# Patient Record
Sex: Female | Born: 1935
Health system: Southern US, Community
[De-identification: ages and names within clinical notes are randomized; demographics above are authoritative.]

## PROBLEM LIST (undated history)

## (undated) DIAGNOSIS — K5790 Diverticulosis of intestine, part unspecified, without perforation or abscess without bleeding: Secondary | ICD-10-CM

## (undated) DIAGNOSIS — R739 Hyperglycemia, unspecified: Secondary | ICD-10-CM

## (undated) DIAGNOSIS — I10 Essential (primary) hypertension: Secondary | ICD-10-CM

## (undated) DIAGNOSIS — K219 Gastro-esophageal reflux disease without esophagitis: Secondary | ICD-10-CM

## (undated) DIAGNOSIS — M8580 Other specified disorders of bone density and structure, unspecified site: Secondary | ICD-10-CM

## (undated) DIAGNOSIS — H9190 Unspecified hearing loss, unspecified ear: Secondary | ICD-10-CM

## (undated) DIAGNOSIS — E785 Hyperlipidemia, unspecified: Secondary | ICD-10-CM

## (undated) DIAGNOSIS — M1990 Unspecified osteoarthritis, unspecified site: Secondary | ICD-10-CM

## (undated) DIAGNOSIS — F0280 Dementia in other diseases classified elsewhere without behavioral disturbance: Secondary | ICD-10-CM

## (undated) DIAGNOSIS — R413 Other amnesia: Secondary | ICD-10-CM

## (undated) DIAGNOSIS — G309 Alzheimer's disease, unspecified: Secondary | ICD-10-CM

## (undated) DIAGNOSIS — K635 Polyp of colon: Secondary | ICD-10-CM

## (undated) DIAGNOSIS — Z860100 Personal history of colon polyps, unspecified: Secondary | ICD-10-CM

## (undated) DIAGNOSIS — S2020XA Contusion of thorax, unspecified, initial encounter: Secondary | ICD-10-CM

## (undated) HISTORY — DX: Gastro-esophageal reflux disease without esophagitis: K21.9

## (undated) HISTORY — DX: Polyp of colon: K63.5

## (undated) HISTORY — DX: Personal history of colon polyps, unspecified: Z86.0100

## (undated) HISTORY — DX: Personal history of colonic polyps: Z86.010

## (undated) HISTORY — DX: Other amnesia: R41.3

## (undated) HISTORY — DX: Essential (primary) hypertension: I10

## (undated) HISTORY — DX: Diverticulosis of intestine, part unspecified, without perforation or abscess without bleeding: K57.90

## (undated) HISTORY — DX: Contusion of thorax, unspecified, initial encounter: S20.20XA

## (undated) HISTORY — DX: Other specified disorders of bone density and structure, unspecified site: M85.80

## (undated) HISTORY — DX: Hyperlipidemia, unspecified: E78.5

## (undated) HISTORY — DX: Hyperglycemia, unspecified: R73.9

## (undated) HISTORY — PX: ABDOMINAL HYSTERECTOMY: SHX81

## (undated) HISTORY — DX: Alzheimer's disease, unspecified: G30.9

## (undated) HISTORY — PX: PARTIAL HYSTERECTOMY: SHX80

## (undated) HISTORY — PX: TUBAL LIGATION: SHX77

## (undated) HISTORY — PX: TONSILLECTOMY: SUR1361

## (undated) HISTORY — DX: Unspecified osteoarthritis, unspecified site: M19.90

## (undated) HISTORY — DX: Dementia in other diseases classified elsewhere without behavioral disturbance: F02.80

---

## 1997-09-22 HISTORY — PX: OTHER SURGICAL HISTORY: SHX169

## 2004-04-09 ENCOUNTER — Other Ambulatory Visit: Payer: Self-pay

## 2004-04-16 HISTORY — PX: CHOLECYSTECTOMY: SHX55

## 2005-09-30 ENCOUNTER — Ambulatory Visit: Payer: Self-pay | Admitting: Family Medicine

## 2006-11-03 ENCOUNTER — Ambulatory Visit: Payer: Self-pay | Admitting: Family Medicine

## 2007-01-19 ENCOUNTER — Ambulatory Visit: Payer: Self-pay | Admitting: Ophthalmology

## 2007-01-25 ENCOUNTER — Ambulatory Visit: Payer: Self-pay | Admitting: Ophthalmology

## 2008-01-12 ENCOUNTER — Ambulatory Visit: Payer: Self-pay | Admitting: Family Medicine

## 2008-11-29 ENCOUNTER — Ambulatory Visit: Payer: Self-pay | Admitting: Family Medicine

## 2008-12-20 DIAGNOSIS — R29898 Other symptoms and signs involving the musculoskeletal system: Secondary | ICD-10-CM | POA: Insufficient documentation

## 2008-12-26 ENCOUNTER — Ambulatory Visit: Payer: Self-pay | Admitting: Family Medicine

## 2009-03-06 ENCOUNTER — Ambulatory Visit: Payer: Self-pay | Admitting: Neurology

## 2009-09-03 ENCOUNTER — Ambulatory Visit: Payer: Self-pay | Admitting: Family Medicine

## 2010-09-12 ENCOUNTER — Ambulatory Visit: Payer: Self-pay | Admitting: Family Medicine

## 2011-10-24 ENCOUNTER — Ambulatory Visit: Payer: Self-pay | Admitting: Family Medicine

## 2011-11-03 ENCOUNTER — Ambulatory Visit: Payer: Self-pay | Admitting: Family Medicine

## 2012-04-08 ENCOUNTER — Emergency Department: Payer: Self-pay | Admitting: Emergency Medicine

## 2012-10-04 ENCOUNTER — Ambulatory Visit: Payer: Self-pay | Admitting: Family Medicine

## 2012-10-04 LAB — CBC WITH DIFFERENTIAL/PLATELET
Basophil #: 0.1 10*3/uL (ref 0.0–0.1)
Eosinophil %: 0.1 %
HCT: 48.3 % — ABNORMAL HIGH (ref 35.0–47.0)
Lymphocyte %: 15.2 %
MCH: 30.5 pg (ref 26.0–34.0)
MCHC: 33.4 g/dL (ref 32.0–36.0)
Monocyte #: 0.9 x10 3/mm (ref 0.2–0.9)
Monocyte %: 6.6 %
Neutrophil #: 11 10*3/uL — ABNORMAL HIGH (ref 1.4–6.5)
Neutrophil %: 77.3 %
RBC: 5.29 10*6/uL — ABNORMAL HIGH (ref 3.80–5.20)
RDW: 12.9 % (ref 11.5–14.5)
WBC: 14.2 10*3/uL — ABNORMAL HIGH (ref 3.6–11.0)

## 2012-10-04 LAB — COMPREHENSIVE METABOLIC PANEL
Alkaline Phosphatase: 83 U/L (ref 50–136)
Anion Gap: 7 (ref 7–16)
Chloride: 102 mmol/L (ref 98–107)
Co2: 29 mmol/L (ref 21–32)
Creatinine: 1.33 mg/dL — ABNORMAL HIGH (ref 0.60–1.30)
EGFR (African American): 46 — ABNORMAL LOW
Glucose: 106 mg/dL — ABNORMAL HIGH (ref 65–99)
Potassium: 3.6 mmol/L (ref 3.5–5.1)
SGPT (ALT): 22 U/L (ref 12–78)
Total Protein: 8.9 g/dL — ABNORMAL HIGH (ref 6.4–8.2)

## 2012-12-10 ENCOUNTER — Ambulatory Visit: Payer: Self-pay | Admitting: Family Medicine

## 2013-01-12 ENCOUNTER — Ambulatory Visit: Payer: Self-pay | Admitting: Family Medicine

## 2013-01-20 ENCOUNTER — Ambulatory Visit: Payer: Self-pay | Admitting: Family Medicine

## 2013-05-30 ENCOUNTER — Ambulatory Visit: Payer: Self-pay | Admitting: Family Medicine

## 2013-08-18 ENCOUNTER — Emergency Department: Payer: Self-pay | Admitting: Emergency Medicine

## 2013-08-18 LAB — COMPREHENSIVE METABOLIC PANEL
Albumin: 3.8 g/dL (ref 3.4–5.0)
Alkaline Phosphatase: 71 U/L
Anion Gap: 5 — ABNORMAL LOW (ref 7–16)
BUN: 26 mg/dL — ABNORMAL HIGH (ref 7–18)
Bilirubin,Total: 0.5 mg/dL (ref 0.2–1.0)
Calcium, Total: 9.9 mg/dL (ref 8.5–10.1)
Chloride: 102 mmol/L (ref 98–107)
Co2: 30 mmol/L (ref 21–32)
EGFR (African American): >60
EGFR (Non-African Amer.): 57 — ABNORMAL LOW
Osmolality: 279 (ref 275–301)
SGOT(AST): 31 U/L (ref 15–37)
Total Protein: 7.9 g/dL (ref 6.4–8.2)

## 2013-08-18 LAB — CBC
HCT: 49.3 % — ABNORMAL HIGH (ref 35.0–47.0)
HGB: 16.5 g/dL — ABNORMAL HIGH (ref 12.0–16.0)
MCH: 29.7 pg (ref 26.0–34.0)
MCV: 89 fL (ref 80–100)
Platelet: 209 10*3/uL (ref 150–440)
RBC: 5.57 10*6/uL — ABNORMAL HIGH (ref 3.80–5.20)
RDW: 13.6 % (ref 11.5–14.5)
WBC: 6 10*3/uL (ref 3.6–11.0)

## 2014-03-27 ENCOUNTER — Ambulatory Visit: Payer: Self-pay | Admitting: Family Medicine

## 2014-03-27 LAB — HM MAMMOGRAPHY: HM MAMMO: NEGATIVE

## 2014-11-21 DIAGNOSIS — R413 Other amnesia: Secondary | ICD-10-CM | POA: Diagnosis not present

## 2014-11-21 DIAGNOSIS — E780 Pure hypercholesterolemia: Secondary | ICD-10-CM | POA: Diagnosis not present

## 2014-11-21 DIAGNOSIS — I10 Essential (primary) hypertension: Secondary | ICD-10-CM | POA: Diagnosis not present

## 2014-11-21 DIAGNOSIS — R739 Hyperglycemia, unspecified: Secondary | ICD-10-CM | POA: Diagnosis not present

## 2014-11-21 DIAGNOSIS — Z0000 Encounter for general adult medical examination without abnormal findings: Secondary | ICD-10-CM | POA: Diagnosis not present

## 2014-11-21 LAB — BASIC METABOLIC PANEL
BUN: 16 mg/dL (ref 4–21)
CREATININE: 1.2 mg/dL — AB (ref 0.5–1.1)
Glucose: 96 mg/dL
POTASSIUM: 4.1 mmol/L (ref 3.4–5.3)
Sodium: 140 mmol/L (ref 137–147)

## 2014-11-21 LAB — LIPID PANEL
CHOLESTEROL: 223 mg/dL — AB (ref 0–200)
HDL: 57 mg/dL (ref 35–70)
LDL Cholesterol: 120 mg/dL
Triglycerides: 231 mg/dL — AB (ref 40–160)

## 2014-11-21 LAB — CBC AND DIFFERENTIAL
HCT: 54 % — AB (ref 36–46)
Hemoglobin: 18 g/dL — AB (ref 12.0–16.0)
Platelets: 255 10*3/uL (ref 150–399)

## 2014-11-21 LAB — HEPATIC FUNCTION PANEL
ALT: 23 U/L (ref 7–35)
AST: 23 U/L (ref 13–35)

## 2014-11-21 LAB — TSH: TSH: 1.84 u[IU]/mL (ref 0.41–5.90)

## 2014-11-21 LAB — HEMOGLOBIN A1C: Hemoglobin A1C: 5.9

## 2014-12-07 ENCOUNTER — Ambulatory Visit: Payer: Self-pay | Admitting: Family Medicine

## 2014-12-07 DIAGNOSIS — Z780 Asymptomatic menopausal state: Secondary | ICD-10-CM | POA: Diagnosis not present

## 2014-12-07 DIAGNOSIS — Z13820 Encounter for screening for osteoporosis: Secondary | ICD-10-CM | POA: Diagnosis not present

## 2014-12-12 DIAGNOSIS — M542 Cervicalgia: Secondary | ICD-10-CM | POA: Insufficient documentation

## 2014-12-12 DIAGNOSIS — R319 Hematuria, unspecified: Secondary | ICD-10-CM | POA: Insufficient documentation

## 2014-12-12 DIAGNOSIS — R569 Unspecified convulsions: Secondary | ICD-10-CM | POA: Insufficient documentation

## 2014-12-12 DIAGNOSIS — F0150 Vascular dementia without behavioral disturbance: Secondary | ICD-10-CM | POA: Diagnosis not present

## 2014-12-12 HISTORY — DX: Cervicalgia: M54.2

## 2015-02-05 DIAGNOSIS — N3090 Cystitis, unspecified without hematuria: Secondary | ICD-10-CM | POA: Diagnosis not present

## 2015-02-05 DIAGNOSIS — R197 Diarrhea, unspecified: Secondary | ICD-10-CM | POA: Diagnosis not present

## 2015-02-05 LAB — CBC AND DIFFERENTIAL: WBC: 0.5 10*3/mL

## 2015-11-20 ENCOUNTER — Ambulatory Visit: Payer: Self-pay | Admitting: Physician Assistant

## 2015-11-21 ENCOUNTER — Telehealth: Payer: Self-pay | Admitting: Family Medicine

## 2015-11-21 ENCOUNTER — Ambulatory Visit: Payer: Self-pay | Admitting: Physician Assistant

## 2015-11-21 DIAGNOSIS — M8580 Other specified disorders of bone density and structure, unspecified site: Secondary | ICD-10-CM | POA: Insufficient documentation

## 2015-11-21 DIAGNOSIS — K635 Polyp of colon: Secondary | ICD-10-CM | POA: Insufficient documentation

## 2015-11-21 DIAGNOSIS — I2510 Atherosclerotic heart disease of native coronary artery without angina pectoris: Secondary | ICD-10-CM

## 2015-11-21 DIAGNOSIS — N814 Uterovaginal prolapse, unspecified: Secondary | ICD-10-CM | POA: Insufficient documentation

## 2015-11-21 DIAGNOSIS — R899 Unspecified abnormal finding in specimens from other organs, systems and tissues: Secondary | ICD-10-CM | POA: Insufficient documentation

## 2015-11-21 DIAGNOSIS — M545 Low back pain, unspecified: Secondary | ICD-10-CM

## 2015-11-21 DIAGNOSIS — R739 Hyperglycemia, unspecified: Secondary | ICD-10-CM | POA: Insufficient documentation

## 2015-11-21 DIAGNOSIS — S2020XA Contusion of thorax, unspecified, initial encounter: Secondary | ICD-10-CM | POA: Insufficient documentation

## 2015-11-21 DIAGNOSIS — K921 Melena: Secondary | ICD-10-CM | POA: Insufficient documentation

## 2015-11-21 DIAGNOSIS — K5790 Diverticulosis of intestine, part unspecified, without perforation or abscess without bleeding: Secondary | ICD-10-CM

## 2015-11-21 DIAGNOSIS — G309 Alzheimer's disease, unspecified: Secondary | ICD-10-CM

## 2015-11-21 DIAGNOSIS — I10 Essential (primary) hypertension: Secondary | ICD-10-CM

## 2015-11-21 DIAGNOSIS — T7840XA Allergy, unspecified, initial encounter: Secondary | ICD-10-CM | POA: Insufficient documentation

## 2015-11-21 DIAGNOSIS — N812 Incomplete uterovaginal prolapse: Secondary | ICD-10-CM | POA: Insufficient documentation

## 2015-11-21 DIAGNOSIS — M109 Gout, unspecified: Secondary | ICD-10-CM | POA: Insufficient documentation

## 2015-11-21 DIAGNOSIS — E7800 Pure hypercholesterolemia, unspecified: Secondary | ICD-10-CM | POA: Insufficient documentation

## 2015-11-21 DIAGNOSIS — F0280 Dementia in other diseases classified elsewhere without behavioral disturbance: Secondary | ICD-10-CM

## 2015-11-21 DIAGNOSIS — W57XXXA Bitten or stung by nonvenomous insect and other nonvenomous arthropods, initial encounter: Secondary | ICD-10-CM | POA: Insufficient documentation

## 2015-11-21 DIAGNOSIS — H269 Unspecified cataract: Secondary | ICD-10-CM | POA: Insufficient documentation

## 2015-11-21 DIAGNOSIS — G4700 Insomnia, unspecified: Secondary | ICD-10-CM

## 2015-11-21 DIAGNOSIS — K219 Gastro-esophageal reflux disease without esophagitis: Secondary | ICD-10-CM | POA: Insufficient documentation

## 2015-11-21 DIAGNOSIS — R413 Other amnesia: Secondary | ICD-10-CM | POA: Insufficient documentation

## 2015-11-21 HISTORY — DX: Unspecified cataract: H26.9

## 2015-11-21 HISTORY — DX: Essential (primary) hypertension: I10

## 2015-11-21 HISTORY — DX: Pure hypercholesterolemia, unspecified: E78.00

## 2015-11-21 HISTORY — DX: Gout, unspecified: M10.9

## 2015-11-21 HISTORY — DX: Dementia in other diseases classified elsewhere, unspecified severity, without behavioral disturbance, psychotic disturbance, mood disturbance, and anxiety: F02.80

## 2015-11-21 HISTORY — DX: Low back pain, unspecified: M54.50

## 2015-11-21 HISTORY — DX: Diverticulosis of intestine, part unspecified, without perforation or abscess without bleeding: K57.90

## 2015-11-21 HISTORY — DX: Insomnia, unspecified: G47.00

## 2015-11-21 HISTORY — DX: Incomplete uterovaginal prolapse: N81.2

## 2015-11-21 HISTORY — DX: Uterovaginal prolapse, unspecified: N81.4

## 2015-11-21 HISTORY — DX: Atherosclerotic heart disease of native coronary artery without angina pectoris: I25.10

## 2015-11-21 NOTE — Telephone Encounter (Signed)
Pt daughter, [name] is requesting a call from Dr Elease Hashimoto.  UE#[phone]

## 2015-11-23 ENCOUNTER — Encounter: Payer: Self-pay | Admitting: Family Medicine

## 2015-11-23 ENCOUNTER — Ambulatory Visit (INDEPENDENT_AMBULATORY_CARE_PROVIDER_SITE_OTHER): Payer: Commercial Managed Care - HMO | Admitting: Family Medicine

## 2015-11-23 VITALS — BP 142/78 | HR 68 | Temp 97.7°F | Resp 20 | Wt 146.0 lb

## 2015-11-23 DIAGNOSIS — F0150 Vascular dementia without behavioral disturbance: Secondary | ICD-10-CM

## 2015-11-23 DIAGNOSIS — I10 Essential (primary) hypertension: Secondary | ICD-10-CM | POA: Diagnosis not present

## 2015-11-23 DIAGNOSIS — E7800 Pure hypercholesterolemia, unspecified: Secondary | ICD-10-CM | POA: Diagnosis not present

## 2015-11-23 MED ORDER — DONEPEZIL HCL 5 MG PO TABS: 5.0000 mg | ORAL_TABLET | Freq: Every day | ORAL | Status: DC

## 2015-11-23 MED ORDER — HYDROCHLOROTHIAZIDE 12.5 MG PO TABS: 12.5000 mg | ORAL_TABLET | Freq: Every day | ORAL | Status: DC
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
## 2015-11-24 LAB — LIPID PANEL
CHOLESTEROL TOTAL: 236 mg/dL — AB (ref 100–199)
Chol/HDL Ratio: 5.4 ratio units — ABNORMAL HIGH (ref 0.0–4.4)
HDL: 44 mg/dL (ref >39)
LDL CALC: 138 mg/dL — AB (ref 0–99)
TRIGLYCERIDES: 268 mg/dL — AB (ref 0–149)
VLDL Cholesterol Cal: 54 mg/dL — ABNORMAL HIGH (ref 5–40)

## 2015-11-24 LAB — CBC WITH DIFFERENTIAL/PLATELET
BASOS ABS: 0 10*3/uL (ref 0.0–0.2)
Basos: 1 %
EOS (ABSOLUTE): 0.1 10*3/uL (ref 0.0–0.4)
EOS: 1 %
HEMATOCRIT: 49.9 % — AB (ref 34.0–46.6)
Hemoglobin: 16.7 g/dL — ABNORMAL HIGH (ref 11.1–15.9)
IMMATURE GRANULOCYTES: 0 %
Immature Grans (Abs): 0 10*3/uL (ref 0.0–0.1)
LYMPHS ABS: 1.9 10*3/uL (ref 0.7–3.1)
LYMPHS: 34 %
MCH: 31.4 pg (ref 26.6–33.0)
MCHC: 33.5 g/dL (ref 31.5–35.7)
MCV: 94 fL (ref 79–97)
MONOCYTES: 8 %
Monocytes Absolute: 0.4 10*3/uL (ref 0.1–0.9)
Neutrophils Absolute: 3.1 10*3/uL (ref 1.4–7.0)
Neutrophils: 56 %
PLATELETS: 244 10*3/uL (ref 150–379)
RBC: 5.32 x10E6/uL — AB (ref 3.77–5.28)
RDW: 13.4 % (ref 12.3–15.4)
WBC: 5.6 10*3/uL (ref 3.4–10.8)

## 2015-11-24 LAB — COMPREHENSIVE METABOLIC PANEL
ALT: 19 IU/L (ref 0–32)
AST: 19 IU/L (ref 0–40)
Albumin/Globulin Ratio: 1.9 (ref 1.1–2.5)
Albumin: 4.3 g/dL (ref 3.5–4.8)
Alkaline Phosphatase: 67 IU/L (ref 39–117)
BUN/Creatinine Ratio: 14 (ref 11–26)
BUN: 14 mg/dL (ref 8–27)
Bilirubin Total: 0.5 mg/dL (ref 0.0–1.2)
CALCIUM: 8.9 mg/dL (ref 8.7–10.3)
CO2: 26 mmol/L (ref 18–29)
CREATININE: 0.97 mg/dL (ref 0.57–1.00)
Chloride: 100 mmol/L (ref 96–106)
GFR, EST AFRICAN AMERICAN: 65 mL/min/{1.73_m2} (ref >59)
GFR, EST NON AFRICAN AMERICAN: 57 mL/min/{1.73_m2} — AB (ref >59)
GLOBULIN, TOTAL: 2.3 g/dL (ref 1.5–4.5)
Glucose: 106 mg/dL — ABNORMAL HIGH (ref 65–99)
Potassium: 3.7 mmol/L (ref 3.5–5.2)
SODIUM: 142 mmol/L (ref 134–144)
TOTAL PROTEIN: 6.6 g/dL (ref 6.0–8.5)

## 2015-11-24 LAB — TSH: TSH: 2.98 u[IU]/mL (ref 0.450–4.500)

## 2015-11-26 NOTE — Telephone Encounter (Signed)
Patient advised as below.  

## 2015-11-26 NOTE — Telephone Encounter (Signed)
-----   Message from Lorie PhenixNancy Maloney, MD sent at 11/24/2015 11:00 AM EST ----- Labs stable except hgb slightly high. May be dehydrated. This has been up before. Recheck on 4 to 6 weeks. Thanks.

## 2015-12-13 ENCOUNTER — Encounter: Payer: Self-pay | Admitting: Family Medicine

## 2015-12-13 ENCOUNTER — Ambulatory Visit (INDEPENDENT_AMBULATORY_CARE_PROVIDER_SITE_OTHER): Payer: Commercial Managed Care - HMO | Admitting: Family Medicine

## 2015-12-13 VITALS — BP 148/84 | HR 72 | Temp 97.7°F | Resp 16 | Wt 148.0 lb

## 2015-12-13 DIAGNOSIS — I10 Essential (primary) hypertension: Secondary | ICD-10-CM | POA: Diagnosis not present

## 2015-12-13 DIAGNOSIS — N8110 Cystocele, unspecified: Secondary | ICD-10-CM | POA: Diagnosis not present

## 2015-12-13 DIAGNOSIS — IMO0002 Reserved for concepts with insufficient information to code with codable children: Secondary | ICD-10-CM

## 2015-12-13 NOTE — Progress Notes (Signed)
Patient ID: Lauren Manning, female   DOB: 10/08/1935, 80 y.o.   MRN: [id number]       Patient: Lauren NySandra L Manning Female    DOB: 03/04/1936   80 y.o.   MRN: [id number] Visit Date: 12/13/2015  Today's Provider: Lorie PhenixNancy , MD   Chief Complaint  Patient presents with  . Hypertension   Subjective:    HPI    Hypertension, follow-up:  BP Readings from Last 3 Encounters:  12/13/15 148/84  11/23/15 142/78  02/05/15 140/72    She was last seen for hypertension 3 weeks ago and was having some dizziness.  BP at that visit was 142/78. Management changes since that visit include discontinue maxzide and start low dose of HCTZ, check labs. She reports excellent compliance with treatment. She is not having side effects.  She is not exercising. She is adherent to low salt diet.   Outside blood pressures are not being checked. She is experiencing none.  Patient denies chest pain.   Cardiovascular risk factors include advanced age (older than 10355 for men, 465 for women).  Use of agents associated with hypertension: none.     Weight trend: stable Wt Readings from Last 3 Encounters:  12/13/15 148 lb (67.132 kg)  11/23/15 146 lb (66.225 kg)  02/05/15 144 lb 3.2 oz (65.409 kg)    Current diet: in general, a "healthy" diet    ------------------------------------------------------------------------  Hemorrhoid: Patient would like to her her hemorrhoids check just to make sure there are changes that she need to be concerned about. Patient denies pain or bleeding.    Allergies  Allergen Reactions  . Enalapril   . Penicillins    Previous Medications   DONEPEZIL (ARICEPT) 5 MG TABLET    Take 1 tablet (5 mg total) by mouth at bedtime.   HYDROCHLOROTHIAZIDE (HYDRODIURIL) 12.5 MG TABLET    Take 1 tablet (12.5 mg total) by mouth daily.   LOVASTATIN (MEVACOR) 40 MG TABLET    Take by mouth.    Review of Systems  Constitutional: Negative.   Cardiovascular: Negative.   Neurological:  Negative.     Social History  Substance Use Topics  . Smoking status: Never Smoker   . Smokeless tobacco: Never Used  . Alcohol Use: No   Objective:   BP 148/84 mmHg  Pulse 72  Temp(Src) 97.7 F (36.5 C) (Oral)  Resp 16  Wt 148 lb (67.132 kg)  Physical Exam  Constitutional: She appears well-developed and well-nourished.  Cardiovascular: Normal rate and regular rhythm.   Pulmonary/Chest: Effort normal and breath sounds normal.  Genitourinary:  Large cystocele   Neurological: She is alert.  Psychiatric: She has a normal mood and affect. Her behavior is normal.     Assessment & Plan:     1. Essential (primary) hypertension Stable. Patient advised to continue current medication and plan of care. F/U pending lab report. - CBC with Differential/Platelet - Comprehensive metabolic panel  2. Cystocele New problem. Patient referred to GYN for evaluation and follow-up. - Ambulatory referral to Gynecology     Patient seen and examined by Dr. Leo GrosserNancy J.. , and note scribed by Liz BeachSulibeya S. Dimas, CMA.  I have reviewed the document for accuracy and completeness and I agree with above. - Leo GrosserNancy J. , MD      Lorie PhenixNancy , MD  [facility]

## 2015-12-14 ENCOUNTER — Telehealth: Payer: Self-pay

## 2015-12-14 LAB — COMPREHENSIVE METABOLIC PANEL
A/G RATIO: 1.5 (ref 1.2–2.2)
ALT: 19 IU/L (ref 0–32)
AST: 23 IU/L (ref 0–40)
Albumin: 4.2 g/dL (ref 3.5–4.8)
Alkaline Phosphatase: 68 IU/L (ref 39–117)
BUN/Creatinine Ratio: 13 (ref 11–26)
BUN: 14 mg/dL (ref 8–27)
Bilirubin Total: 0.4 mg/dL (ref 0.0–1.2)
CALCIUM: 9.7 mg/dL (ref 8.7–10.3)
CO2: 28 mmol/L (ref 18–29)
Chloride: 97 mmol/L (ref 96–106)
Creatinine, Ser: 1.04 mg/dL — ABNORMAL HIGH (ref 0.57–1.00)
GFR, EST AFRICAN AMERICAN: 60 mL/min/{1.73_m2} (ref >59)
GFR, EST NON AFRICAN AMERICAN: 52 mL/min/{1.73_m2} — AB (ref >59)
GLUCOSE: 88 mg/dL (ref 65–99)
Globulin, Total: 2.8 g/dL (ref 1.5–4.5)
POTASSIUM: 3.5 mmol/L (ref 3.5–5.2)
Sodium: 142 mmol/L (ref 134–144)
TOTAL PROTEIN: 7 g/dL (ref 6.0–8.5)

## 2015-12-14 LAB — CBC WITH DIFFERENTIAL/PLATELET
BASOS: 0 %
Basophils Absolute: 0 10*3/uL (ref 0.0–0.2)
EOS (ABSOLUTE): 0.1 10*3/uL (ref 0.0–0.4)
Eos: 1 %
HEMATOCRIT: 47.2 % — AB (ref 34.0–46.6)
Hemoglobin: 16.4 g/dL — ABNORMAL HIGH (ref 11.1–15.9)
IMMATURE GRANS (ABS): 0 10*3/uL (ref 0.0–0.1)
IMMATURE GRANULOCYTES: 0 %
LYMPHS: 21 %
Lymphocytes Absolute: 1.6 10*3/uL (ref 0.7–3.1)
MCH: 31.2 pg (ref 26.6–33.0)
MCHC: 34.7 g/dL (ref 31.5–35.7)
MCV: 90 fL (ref 79–97)
MONOCYTES: 6 %
Monocytes Absolute: 0.5 10*3/uL (ref 0.1–0.9)
NEUTROS ABS: 5.3 10*3/uL (ref 1.4–7.0)
NEUTROS PCT: 72 %
PLATELETS: 267 10*3/uL (ref 150–379)
RBC: 5.26 x10E6/uL (ref 3.77–5.28)
RDW: 13.4 % (ref 12.3–15.4)
WBC: 7.4 10*3/uL (ref 3.4–10.8)

## 2015-12-14 NOTE — Telephone Encounter (Signed)
Mrs Aram BeechamCynthia advised as below and Patient's daughter verbalizes understanding and is in agreement with treatment plan. Aram BeechamCynthia reports that Mrs Dois DavenportSandra has not had a work up for this in the past. Mrs Aram BeechamCynthia would like to know what kind of problems the high hemoglobin could cause for [name].

## 2015-12-14 NOTE — Telephone Encounter (Signed)
-----   Message from Lorie PhenixNancy Maloney, MD sent at 12/14/2015 12:23 PM EDT ----- Patient still with high hemoglobin but not as high as previous. Please clarify if patient has had work up for this in the past, do not think so.  Also, make sure taking ASA daily.   If not work up, since improved, can continue staying well hydrated and recheck in 6 weeks for stability or can refer. Really borderline at this time. Please speak with daughter, not patient. Thanks.

## 2015-12-15 NOTE — Telephone Encounter (Signed)
Increased risks of clots.  Proceed  with ASA and recheck in 6 weeks if  Lauren Manning is in agreement with plan. Thanks.

## 2015-12-17 NOTE — Telephone Encounter (Signed)
LMTCB

## 2015-12-21 NOTE — Telephone Encounter (Signed)
NA

## 2016-01-09 ENCOUNTER — Encounter: Payer: Commercial Managed Care - HMO | Admitting: Family Medicine

## 2016-02-06 ENCOUNTER — Ambulatory Visit (INDEPENDENT_AMBULATORY_CARE_PROVIDER_SITE_OTHER): Payer: Commercial Managed Care - HMO | Admitting: Obstetrics and Gynecology

## 2016-02-06 ENCOUNTER — Encounter: Payer: Self-pay | Admitting: Obstetrics and Gynecology

## 2016-02-06 VITALS — BP 213/90 | HR 67 | Wt 145.1 lb

## 2016-02-06 DIAGNOSIS — I10 Essential (primary) hypertension: Secondary | ICD-10-CM

## 2016-02-06 DIAGNOSIS — N8110 Cystocele, unspecified: Secondary | ICD-10-CM

## 2016-02-06 DIAGNOSIS — IMO0002 Reserved for concepts with insufficient information to code with codable children: Secondary | ICD-10-CM
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
## 2016-02-07 ENCOUNTER — Telehealth: Payer: Self-pay | Admitting: Family Medicine

## 2016-02-07 ENCOUNTER — Encounter: Payer: Self-pay | Admitting: Obstetrics and Gynecology

## 2016-02-07 NOTE — Telephone Encounter (Signed)
Patient is asking what dose ASA she should be taking.  She does not have it listed on her Epic chart and when I checked [employer] I did not see that she had it listed there at any time either. ED

## 2016-02-07 NOTE — Telephone Encounter (Signed)
Advised pt and added to med list. Allene DillonEmily Drozdowski, CMA

## 2016-02-07 NOTE — Telephone Encounter (Signed)
Pt states she has been taking an over the counter [employer] aspirin for years and can not remember the dosage she needs to take.  Pt states the threw away the bottle.  UJ#[phone]/FACB#[phone]/MW

## 2016-02-07 NOTE — Telephone Encounter (Signed)
Would take Baby ASA daily.   PLease notify patient and put in chart. Thanks.

## 2016-02-08 ENCOUNTER — Encounter: Payer: Self-pay | Admitting: Family Medicine

## 2016-02-08 ENCOUNTER — Ambulatory Visit (INDEPENDENT_AMBULATORY_CARE_PROVIDER_SITE_OTHER): Payer: Commercial Managed Care - HMO | Admitting: Family Medicine

## 2016-02-08 VITALS — BP 186/88 | HR 80 | Temp 98.4°F | Resp 16 | Wt 143.0 lb

## 2016-02-08 DIAGNOSIS — I10 Essential (primary) hypertension: Secondary | ICD-10-CM | POA: Diagnosis not present

## 2016-02-08 DIAGNOSIS — W57XXXA Bitten or stung by nonvenomous insect and other nonvenomous arthropods, initial encounter: Secondary | ICD-10-CM

## 2016-02-08 DIAGNOSIS — T148 Other injury of unspecified body region: Secondary | ICD-10-CM | POA: Diagnosis not present

## 2016-02-08 DIAGNOSIS — E7800 Pure hypercholesterolemia, unspecified: Secondary | ICD-10-CM

## 2016-02-08 MED ORDER — LOVASTATIN 40 MG PO TABS: 40.0000 mg | ORAL_TABLET | Freq: Every day | ORAL | Status: DC

## 2016-02-08 MED ORDER — HYDROCHLOROTHIAZIDE 12.5 MG PO TABS: 12.5000 mg | ORAL_TABLET | Freq: Every day | ORAL | Status: DC

## 2016-02-08 MED ORDER — AMLODIPINE BESYLATE 2.5 MG PO TABS: 2.5000 mg | ORAL_TABLET | Freq: Every day | ORAL | Status: DC

## 2016-02-08 NOTE — Progress Notes (Signed)
Patient ID: Lauren Manning Laneve, female   DOB: 10/30/1935, 80 y.o.   MRN: [id number]       Patient: Lauren Manning Care Female    DOB: 03/15/1936   80 y.o.   MRN: [id number] Visit Date: 02/08/2016  Today's Provider: Lorie PhenixNancy , MD   Chief Complaint  Patient presents with  . Hypertension   Subjective:    HPI  Hypertension, follow-up:  BP Readings from Last 3 Encounters:  02/08/16 186/88  02/06/16 213/90  12/13/15 148/84    She was last seen for hypertension 2 months ago.  BP at that visit was 148/84. Management changes since that visit include no changes. She reports excellent compliance with treatment. She is not having side effects.  She is not exercising. She is adherent to low salt diet.   Outside blood pressures are elevated. She is experiencing fatigue and dizzy.  Patient denies chest pain.   Cardiovascular risk factors include advanced age (older than 2655 for men, 6765 for women).  Use of agents associated with hypertension: none.     Weight trend: stable Wt Readings from Last 3 Encounters:  02/08/16 143 lb (64.864 kg)  02/06/16 145 lb 1.6 oz (65.817 kg)  12/13/15 148 lb (67.132 kg)   Current diet: in general, a "healthy" diet    ------------------------------------------------------------------------  Tick bite: Patient pulled off tick from abdomen on Wednesday. Has read area around it. No fever or systemic symptoms.     Allergies  Allergen Reactions  . Enalapril   . Penicillins    Previous Medications   ASPIRIN 81 MG TABLET    Take 81 mg by mouth daily.   DONEPEZIL (ARICEPT) 5 MG TABLET    Take 1 tablet (5 mg total) by mouth at bedtime.   HYDROCHLOROTHIAZIDE (HYDRODIURIL) 12.5 MG TABLET    Take 1 tablet (12.5 mg total) by mouth daily.   LOVASTATIN (MEVACOR) 40 MG TABLET    Take by mouth.    Review of Systems  Constitutional: Positive for fatigue.  Cardiovascular: Negative.   Neurological: Positive for dizziness.    Social History  Substance Use Topics    . Smoking status: Never Smoker   . Smokeless tobacco: Never Used  . Alcohol Use: No   Objective:   BP 186/88 mmHg  Pulse 80  Temp(Src) 98.4 F (36.9 C) (Oral)  Resp 16  Wt 143 lb (64.864 kg)  Physical Exam  Constitutional: She is oriented to person, place, and time. She appears well-developed and well-nourished.  Cardiovascular: Normal rate, regular rhythm and normal heart sounds.   Pulmonary/Chest: Effort normal and breath sounds normal.  Neurological: She is alert and oriented to person, place, and time.      Assessment & Plan:     1. Essential (primary) hypertension Worsening. Patient started on amlodipine 2.5 mg daily. Patient advised to continue monitoring blood pressure. - hydrochlorothiazide (HYDRODIURIL) 12.5 MG tablet; Take 1 tablet (12.5 mg total) by mouth daily.  Dispense: 90 tablet; Refill: 3 - amLODipine (NORVASC) 2.5 MG tablet; Take 1 tablet (2.5 mg total) by mouth daily.  Dispense: 30 tablet; Refill: 1  2. Hypercholesteremia Stable. Continue current medication. - lovastatin (MEVACOR) 40 MG tablet; Take 1 tablet (40 mg total) by mouth at bedtime.  Dispense: 90 tablet; Refill: 3  3. Tick bite New problem. Patient advised to call if having fever or any other symptoms.       Patient seen and examined by Dr. Leo GrosserNancy J.. , and note scribed by Liz BeachSulibeya S. Dimas, CMA.  I have reviewed the document for accuracy and completeness and I agree with above. - Jerrell Belfast, MD   Margarita Rana, MD  [facility]

## 2016-02-08 NOTE — Patient Instructions (Signed)
Please start Amlodipine 2.5 mg daily. Please keep a record of blood pressure readings. Please call if having any fever in the next few weeks due to tick bite.

## 2016-02-14 ENCOUNTER — Telehealth: Payer: Self-pay | Admitting: Family Medicine

## 2016-02-14 DIAGNOSIS — W57XXXA Bitten or stung by nonvenomous insect and other nonvenomous arthropods, initial encounter: Secondary | ICD-10-CM | POA: Diagnosis not present

## 2016-02-14 DIAGNOSIS — I10 Essential (primary) hypertension: Secondary | ICD-10-CM | POA: Diagnosis not present

## 2016-02-14 NOTE — Telephone Encounter (Signed)
Pt's daughter Lenda KelpCynthia Sawyer called stating that pt's blood pressure is still high.She is wondering if dosage of new med prescribed for blood pressure needs to be changed  (She did not know the name of it) or if pt needs to be switched to something different.Pt can be reached at [phone].She is out of town at present time

## 2016-02-14 NOTE — Telephone Encounter (Signed)
Increase to 5 mg. Double up on Amlodipine. Thanks.

## 2016-02-14 NOTE — Telephone Encounter (Signed)
Left detailed voicemail message advising patient as below. KW

## 2016-02-19 ENCOUNTER — Telehealth: Payer: Self-pay

## 2016-02-19 DIAGNOSIS — I10 Essential (primary) hypertension: Secondary | ICD-10-CM

## 2016-02-19 MED ORDER — AMLODIPINE BESYLATE 5 MG PO TABS: 10.0000 mg | ORAL_TABLET | Freq: Every day | ORAL | Status: DC

## 2016-02-19 MED ORDER — METOPROLOL SUCCINATE ER 25 MG PO TB24: 25.0000 mg | ORAL_TABLET | Freq: Every day | ORAL | Status: DC

## 2016-02-19 NOTE — Telephone Encounter (Signed)
Lauren Manning (Pt's daughter) called reporting pt's blood pressure is still elevated.  She states that Lauren Manning is in [place] visiting her sister Lauren Manning.  I called Lauren Manning at [phone] to get more information.  Lauren Manning says she took Lauren Manning to Urgent care in [place] and they only increased her Amlodipine from 2.5mg  to 5mg  a day.  Lauren Manning says Lauren Manning's blood pressure is still running 190-200's/90-100's.  After talking with Dr. Elease HashimotoMaloney she added Metoprolol Succinate 25mg  a day and increased her Amlodipine to 10mg  a day.  [facility] [place]  I also advised Lauren Manning that Lauren Manning would need a follow up office visit with Dr. Elease HashimotoMaloney soon.  She agreed and expressed that she would like Lauren Manning to come home sooner then her scheduled date of June 9th.  Lauren Regal(Carol did say she told Lauren Manning that she wanted Lauren Manning to come home sooner.)  I tried calling Lauren Manning with the medication changes and left her a message to give me a call back.   Thanks,   -Vernona RiegerLaura

## 2016-02-27 ENCOUNTER — Ambulatory Visit (INDEPENDENT_AMBULATORY_CARE_PROVIDER_SITE_OTHER): Payer: Commercial Managed Care - HMO | Admitting: Physician Assistant

## 2016-02-27 ENCOUNTER — Encounter: Payer: Self-pay | Admitting: Physician Assistant

## 2016-02-27 VITALS — BP 150/70 | HR 62 | Temp 97.5°F | Resp 16 | Wt 143.4 lb

## 2016-02-27 DIAGNOSIS — Z79899 Other long term (current) drug therapy: Secondary | ICD-10-CM

## 2016-02-27 DIAGNOSIS — I10 Essential (primary) hypertension: Secondary | ICD-10-CM | POA: Diagnosis not present

## 2016-02-27 DIAGNOSIS — D582 Other hemoglobinopathies: Secondary | ICD-10-CM

## 2016-02-27 NOTE — Progress Notes (Addendum)
Patient: Lauren Manning Female    DOB: [birth year]   80 y.o.   MRN: [id number] Visit Date: 02/27/2016  Today's Provider: Margaretann Loveless, PA-C   Chief Complaint  Patient presents with  . Hypertension   Subjective:    HPI Patient is here with c/o blood pressure being high. Patient was in [place] and her blood pressure readings were in the 190's-200's/90's-100's. Per patient's daughter [name] in the last three days blood pressures reading have been in the 180's/90's. Today BP is 150/70 and took it also with patient's machine and it is 149/81 Pulse 60. Patient reports feeling dizzy sometimes, very tired since starting metoprolol over last week, increased acid reflux and sleeping a lot. No chest pain or leg swelling. Dr. Elease Hashimoto sent in  Metoprolol Succinate 25 mg a day and increased her Amlodipine to 10 mg a day. She has only been taking amlodipine  and is taking HCTZ  instead of 12.5mg .     Allergies  Allergen Reactions  . Enalapril   . Penicillins    Previous Medications   AMLODIPINE (NORVASC) 5 MG TABLET    Take 2 tablets (10 mg total) by mouth daily.   ASPIRIN 81 MG TABLET    Take 81 mg by mouth daily.   DONEPEZIL (ARICEPT) 5 MG TABLET    Take 1 tablet (5 mg total) by mouth at bedtime.   HYDROCHLOROTHIAZIDE (HYDRODIURIL) 12.5 MG TABLET    Take 1 tablet (12.5 mg total) by mouth daily.   LOVASTATIN (MEVACOR) 40 MG TABLET    Take 1 tablet (40 mg total) by mouth at bedtime.   METOPROLOL SUCCINATE (TOPROL-XL) 25 MG 24 HR TABLET    Take 1 tablet (25 mg total) by mouth daily.    Review of Systems  Constitutional: Positive for fatigue.  Respiratory: Negative.   Cardiovascular: Negative for chest pain, palpitations and leg swelling.  Gastrointestinal: Negative.   Neurological: Positive for dizziness. Negative for light-headedness and headaches.    Social History  Substance Use Topics  . Smoking status: Never Smoker   . Smokeless tobacco: Never Used  . Alcohol Use:  No   Objective:   There were no vitals taken for this visit.  Physical Exam  Constitutional: She appears well-developed and well-nourished. No distress.  Neck: Normal range of motion. Neck supple.  Cardiovascular: Normal rate, regular rhythm and normal heart sounds.  Exam reveals no gallop and no friction rub.   No murmur heard. Pulmonary/Chest: Effort normal and breath sounds normal. No respiratory distress. She has no wheezes. She has no rales.  Musculoskeletal: She exhibits no edema.  Skin: She is not diaphoretic.  Vitals reviewed.       Assessment & Plan:     1. Essential hypertension Will check labs as below and f/u pending results.  - CBC with Differential - Basic Metabolic Panel (BMET)  2. Medication management Discussed medications between her and her daughter. She is going to continue metoprolol  once daily, amlodipine  BID, HCTZ 12.5 mg once daily, lovastatin  once daily, ASA  once daily and aricept  once daily. Daughter voices understanding and will set pill organizer for her mom as discussed. She has f/u appt on 03/10/16 for CPE.  3. Elevated hemoglobin (HCC) Most recent 16.4. Decreased from 16.7 a few weeks before and 18 one year ago. Will recheck and f/u pending results.   Addend: HgB and Hct elevated still. Recommend referral to hematology to R/O polycythemia vs hemachromatosis  vs other hematological cause. - CBC with Differential - Ferritin       Margaretann LovelessJennifer M Burnette, PA-C  [facility]

## 2016-02-27 NOTE — Patient Instructions (Signed)
Hypertension Hypertension, commonly called high blood pressure, is when the force of blood pumping through your arteries is too strong. Your arteries are the blood vessels that carry blood from your heart throughout your body. A blood pressure reading consists of a higher number over a lower number, such as 110/72. The higher number (systolic) is the pressure inside your arteries when your heart pumps. The lower number (diastolic) is the pressure inside your arteries when your heart relaxes. Ideally you want your blood pressure below 120/80. Hypertension forces your heart to work harder to pump blood. Your arteries may become narrow or stiff. Having untreated or uncontrolled hypertension can cause heart attack, stroke, kidney disease, and other problems. RISK FACTORS Some risk factors for high blood pressure are controllable. Others are not.  Risk factors you cannot control include:   Race. You may be at higher risk if you are African American.  Age. Risk increases with age.  Gender. Men are at higher risk than women before age 45 years. After age 65, women are at higher risk than men. Risk factors you can control include:  Not getting enough exercise or physical activity.  Being overweight.  Getting too much fat, sugar, calories, or salt in your diet.  Drinking too much alcohol. SIGNS AND SYMPTOMS Hypertension does not usually cause signs or symptoms. Extremely high blood pressure (hypertensive crisis) may cause headache, anxiety, shortness of breath, and nosebleed. DIAGNOSIS To check if you have hypertension, your health care provider will measure your blood pressure while you are seated, with your arm held at the level of your heart. It should be measured at least twice using the same arm. Certain conditions can cause a difference in blood pressure between your right and left arms. A blood pressure reading that is higher than normal on one occasion does not mean that you need treatment. If  it is not clear whether you have high blood pressure, you may be asked to return on a different day to have your blood pressure checked again. Or, you may be asked to monitor your blood pressure at home for 1 or more weeks. TREATMENT Treating high blood pressure includes making lifestyle changes and possibly taking medicine. Living a healthy lifestyle can help lower high blood pressure. You may need to change some of your habits. Lifestyle changes may include:  Following the DASH diet. This diet is high in fruits, vegetables, and whole grains. It is low in salt, red meat, and added sugars.  Keep your sodium intake below 2,300 mg per day.  Getting at least 30-45 minutes of aerobic exercise at least 4 times per week.  Losing weight if necessary.  Not smoking.  Limiting alcoholic beverages.  Learning ways to reduce stress. Your health care provider may prescribe medicine if lifestyle changes are not enough to get your blood pressure under control, and if one of the following is true:  You are 18-59 years of age and your systolic blood pressure is above 140.  You are 60 years of age or older, and your systolic blood pressure is above 150.  Your diastolic blood pressure is above 90.  You have diabetes, and your systolic blood pressure is over 140 or your diastolic blood pressure is over 90.  You have kidney disease and your blood pressure is above 140/90.  You have heart disease and your blood pressure is above 140/90. Your personal target blood pressure may vary depending on your medical conditions, your age, and other factors. HOME CARE INSTRUCTIONS    Have your blood pressure rechecked as directed by your health care provider.   Take medicines only as directed by your health care provider. Follow the directions carefully. Blood pressure medicines must be taken as prescribed. The medicine does not work as well when you skip doses. Skipping doses also puts you at risk for  problems.  Do not smoke.   Monitor your blood pressure at home as directed by your health care provider. SEEK MEDICAL CARE IF:   You think you are having a reaction to medicines taken.  You have recurrent headaches or feel dizzy.  You have swelling in your ankles.  You have trouble with your vision. SEEK IMMEDIATE MEDICAL CARE IF:  You develop a severe headache or confusion.  You have unusual weakness, numbness, or feel faint.  You have severe chest or abdominal pain.  You vomit repeatedly.  You have trouble breathing. MAKE SURE YOU:   Understand these instructions.  Will watch your condition.  Will get help right away if you are not doing well or get worse.   This information is not intended to replace advice given to you by your health care provider. Make sure you discuss any questions you have with your health care provider.   Document Released: 09/08/2005 Document Revised: 01/23/2015 Document Reviewed: 07/01/2013 Elsevier Interactive Patient Education 2016 Elsevier Inc.  

## 2016-02-28 LAB — BASIC METABOLIC PANEL
BUN / CREAT RATIO: 15 (ref 12–28)
BUN: 16 mg/dL (ref 8–27)
CHLORIDE: 99 mmol/L (ref 96–106)
CO2: 27 mmol/L (ref 18–29)
Calcium: 10.5 mg/dL — ABNORMAL HIGH (ref 8.7–10.3)
Creatinine, Ser: 1.04 mg/dL — ABNORMAL HIGH (ref 0.57–1.00)
GFR calc Af Amer: 60 mL/min/{1.73_m2} (ref >59)
GFR calc non Af Amer: 52 mL/min/{1.73_m2} — ABNORMAL LOW (ref >59)
GLUCOSE: 111 mg/dL — AB (ref 65–99)
Potassium: 4.7 mmol/L (ref 3.5–5.2)
SODIUM: 146 mmol/L — AB (ref 134–144)

## 2016-02-28 LAB — FERRITIN: FERRITIN: 275 ng/mL — AB (ref 15–150)

## 2016-02-28 LAB — CBC WITH DIFFERENTIAL/PLATELET
BASOS ABS: 0 10*3/uL (ref 0.0–0.2)
Basos: 0 %
EOS (ABSOLUTE): 0.1 10*3/uL (ref 0.0–0.4)
Eos: 1 %
Hematocrit: 51.3 % — ABNORMAL HIGH (ref 34.0–46.6)
Hemoglobin: 17.1 g/dL — ABNORMAL HIGH (ref 11.1–15.9)
IMMATURE GRANS (ABS): 0 10*3/uL (ref 0.0–0.1)
Immature Granulocytes: 0 %
LYMPHS: 29 %
Lymphocytes Absolute: 2 10*3/uL (ref 0.7–3.1)
MCH: 29.7 pg (ref 26.6–33.0)
MCHC: 33.3 g/dL (ref 31.5–35.7)
MCV: 89 fL (ref 79–97)
Monocytes Absolute: 0.4 10*3/uL (ref 0.1–0.9)
Monocytes: 5 %
NEUTROS ABS: 4.6 10*3/uL (ref 1.4–7.0)
NEUTROS PCT: 65 %
PLATELETS: 271 10*3/uL (ref 150–379)
RBC: 5.75 x10E6/uL — AB (ref 3.77–5.28)
RDW: 13.4 % (ref 12.3–15.4)
WBC: 7 10*3/uL (ref 3.4–10.8)

## 2016-02-29 ENCOUNTER — Telehealth: Payer: Self-pay

## 2016-02-29 NOTE — Telephone Encounter (Signed)
-----   Message from Margaretann LovelessJennifer M Burnette, [place]-C sent at 02/29/2016  8:40 AM EDT ----- All labs are stable with exception of hemoglobin which is starting to increase again. With her high BP and elevated hemoglobin I, Dr. Sullivan LoneGilbert and Dr. Elease HashimotoMaloney feel she should be evaluated by hematology to make sure nothing else is going on. I will put in this order and we will get her scheduled. Please notify daughter of results due to patient's memory issues.  Thanks! -JB

## 2016-02-29 NOTE — Telephone Encounter (Signed)
Patient's daughter Aram BeechamCynthia was advised as directed below.  Thanks,  -Joseline

## 2016-02-29 NOTE — Addendum Note (Signed)
Addended by: Margaretann LovelessBURNETTE, JENNIFER M on: 02/29/2016 08:43 AM   Modules accepted: Orders, SmartSet

## 2016-03-05 ENCOUNTER — Ambulatory Visit: Payer: Commercial Managed Care - HMO | Admitting: Physician Assistant

## 2016-03-10 ENCOUNTER — Ambulatory Visit (INDEPENDENT_AMBULATORY_CARE_PROVIDER_SITE_OTHER): Payer: Commercial Managed Care - HMO | Admitting: Physician Assistant

## 2016-03-10 ENCOUNTER — Encounter: Payer: Self-pay | Admitting: Physician Assistant

## 2016-03-10 VITALS — BP 150/72 | HR 59 | Temp 97.7°F | Resp 16 | Ht 61.0 in | Wt 147.8 lb

## 2016-03-10 DIAGNOSIS — Z0000 Encounter for general adult medical examination without abnormal findings: Secondary | ICD-10-CM

## 2016-03-10 DIAGNOSIS — Z1211 Encounter for screening for malignant neoplasm of colon: Secondary | ICD-10-CM

## 2016-03-10 NOTE — Patient Instructions (Signed)
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
## 2016-03-11 ENCOUNTER — Ambulatory Visit: Payer: Commercial Managed Care - HMO | Admitting: Obstetrics and Gynecology

## 2016-03-13 ENCOUNTER — Encounter: Payer: Commercial Managed Care - HMO | Admitting: Family Medicine

## 2016-03-13 ENCOUNTER — Telehealth: Payer: Self-pay

## 2016-03-13 NOTE — Telephone Encounter (Signed)
Pt requested a call back end of July to schedule colonoscopy[place]

## 2016-03-17 ENCOUNTER — Ambulatory Visit: Payer: Self-pay | Admitting: Internal Medicine

## 2016-03-18 ENCOUNTER — Inpatient Hospital Stay: Payer: Commercial Managed Care - HMO | Attending: Internal Medicine | Admitting: Oncology

## 2016-03-18 ENCOUNTER — Encounter (INDEPENDENT_AMBULATORY_CARE_PROVIDER_SITE_OTHER): Payer: Self-pay

## 2016-03-18 ENCOUNTER — Inpatient Hospital Stay: Payer: Commercial Managed Care - HMO

## 2016-03-18 ENCOUNTER — Encounter: Payer: Self-pay | Admitting: Oncology

## 2016-03-18 VITALS — BP 181/81 | HR 58 | Temp 97.3°F | Wt 145.9 lb

## 2016-03-18 DIAGNOSIS — M109 Gout, unspecified: Secondary | ICD-10-CM | POA: Diagnosis not present

## 2016-03-18 DIAGNOSIS — D751 Secondary polycythemia: Secondary | ICD-10-CM | POA: Diagnosis not present

## 2016-03-18 DIAGNOSIS — E785 Hyperlipidemia, unspecified: Secondary | ICD-10-CM | POA: Insufficient documentation

## 2016-03-18 DIAGNOSIS — M1990 Unspecified osteoarthritis, unspecified site: Secondary | ICD-10-CM | POA: Insufficient documentation

## 2016-03-18 DIAGNOSIS — Z79899 Other long term (current) drug therapy: Secondary | ICD-10-CM | POA: Diagnosis not present

## 2016-03-18 DIAGNOSIS — M8580 Other specified disorders of bone density and structure, unspecified site: Secondary | ICD-10-CM | POA: Diagnosis not present

## 2016-03-18 DIAGNOSIS — K219 Gastro-esophageal reflux disease without esophagitis: Secondary | ICD-10-CM | POA: Diagnosis not present

## 2016-03-18 DIAGNOSIS — I10 Essential (primary) hypertension: Secondary | ICD-10-CM | POA: Insufficient documentation

## 2016-03-18 DIAGNOSIS — Z86010 Personal history of colonic polyps: Secondary | ICD-10-CM | POA: Diagnosis not present

## 2016-03-18 DIAGNOSIS — Z7982 Long term (current) use of aspirin: Secondary | ICD-10-CM | POA: Insufficient documentation

## 2016-03-18 LAB — CBC
HEMATOCRIT: 48.5 % — AB (ref 35.0–47.0)
HEMOGLOBIN: 16.9 g/dL — AB (ref 12.0–16.0)
MCH: 31.3 pg (ref 26.0–34.0)
MCHC: 34.9 g/dL (ref 32.0–36.0)
MCV: 89.5 fL (ref 80.0–100.0)
Platelets: 240 10*3/uL (ref 150–440)
RBC: 5.42 MIL/uL — AB (ref 3.80–5.20)
RDW: 13.6 % (ref 11.5–14.5)
WBC: 6.6 10*3/uL (ref 3.6–11.0)

## 2016-03-18 LAB — IRON AND TIBC
Iron: 75 ug/dL (ref 28–170)
SATURATION RATIOS: 18 % (ref 10.4–31.8)
TIBC: 420 ug/dL (ref 250–450)
UIBC: 345 ug/dL

## 2016-03-18 LAB — FERRITIN: Ferritin: 188 ng/mL (ref 11–307)
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
## 2016-03-18 NOTE — Progress Notes (Signed)
States has fatigue and decreased energy levels. Has elevated BP that is being managed by Dr. Elease HashimotoMaloney.

## 2016-03-19 ENCOUNTER — Other Ambulatory Visit: Payer: Self-pay | Admitting: Physician Assistant

## 2016-03-19 DIAGNOSIS — I10 Essential (primary) hypertension: Secondary | ICD-10-CM

## 2016-03-19 MED ORDER — METOPROLOL SUCCINATE ER 25 MG PO TB24: 25.0000 mg | ORAL_TABLET | Freq: Every day | ORAL | Status: DC

## 2016-03-19 NOTE — Telephone Encounter (Addendum)
Pt contacted office for refill request on the following medications:  metoprolol succinate (TOPROL-XL) 25 MG 24 hr tablet.  [facility].  UJ#[phone]/FACB#[phone]/MW

## 2016-03-19 NOTE — Telephone Encounter (Signed)
LM that [facility]

## 2016-03-24 LAB — JAK2 GENOTYPR

## 2016-03-24 LAB — CARBON MONOXIDE, BLOOD (PERFORMED AT REF LAB): CARBON MONOXIDE, BLOOD: 2.8 % (ref 0.0–3.6)

## 2016-03-24 LAB — HEMOCHROMATOSIS DNA-PCR(C282Y,H63D)

## 2016-03-24 LAB — ERYTHROPOIETIN: ERYTHROPOIETIN: 5.3 m[IU]/mL (ref 2.6–18.5)

## 2016-04-11 ENCOUNTER — Other Ambulatory Visit: Payer: Self-pay | Admitting: *Deleted

## 2016-04-11 DIAGNOSIS — D751 Secondary polycythemia: Secondary | ICD-10-CM

## 2016-04-17 ENCOUNTER — Inpatient Hospital Stay: Payer: Commercial Managed Care - HMO

## 2016-04-17 ENCOUNTER — Inpatient Hospital Stay: Payer: Commercial Managed Care - HMO | Admitting: Oncology

## 2016-04-17 ENCOUNTER — Other Ambulatory Visit: Payer: Self-pay | Admitting: Physician Assistant

## 2016-04-17 ENCOUNTER — Ambulatory Visit: Payer: Commercial Managed Care - HMO | Admitting: Obstetrics and Gynecology

## 2016-04-17 DIAGNOSIS — I10 Essential (primary) hypertension: Secondary | ICD-10-CM

## 2016-04-17 MED ORDER — AMLODIPINE BESYLATE 10 MG PO TABS: 10.0000 mg | ORAL_TABLET | Freq: Every day | ORAL | 3 refills | Status: DC

## 2016-04-23 ENCOUNTER — Telehealth: Payer: Self-pay

## 2016-04-23 ENCOUNTER — Other Ambulatory Visit: Payer: Self-pay

## 2016-04-23 NOTE — Telephone Encounter (Signed)
Gastroenterology Pre-Procedure Review  Request Date: 07/01/2016 Requesting Physician: Dr. Rosezetta Schlatter  PATIENT REVIEW QUESTIONS: The patient responded to the following health history questions as indicated:    1. Are you having any GI issues? yes (Diarrhea once every other week ) 2. Do you have a personal history of Polyps? no 3. Do you have a family history of Colon Cancer or Polyps? no 4. Diabetes Mellitus? no 5. Joint replacements in the past 12 months?no 6. Major health problems in the past 3 months?no 7. Any artificial heart valves, MVP, or defibrillator?no    MEDICATIONS & ALLERGIES:    Patient reports the following regarding taking any anticoagulation/antiplatelet therapy:   Plavix, Coumadin, Eliquis, Xarelto, Lovenox, Pradaxa, Brilinta, or Effient? no Aspirin? yes (heart health )  Patient confirms/reports the following medications:  Current Outpatient Prescriptions  Medication Sig Dispense Refill  . amLODipine (NORVASC) 10 MG tablet Take 1 tablet (10 mg total) by mouth daily. 30 tablet 3  . aspirin 81 MG tablet Take 81 mg by mouth daily.    Marland Kitchen donepezil (ARICEPT) 5 MG tablet Take 1 tablet (5 mg total) by mouth at bedtime. 90 tablet 3  . hydrochlorothiazide (HYDRODIURIL) 12.5 MG tablet Take 1 tablet (12.5 mg total) by mouth daily. 30 tablet 3  . lovastatin (MEVACOR) 40 MG tablet Take 1 tablet (40 mg total) by mouth at bedtime. 30 tablet 3  . metoprolol succinate (TOPROL-XL) 25 MG 24 hr tablet Take 1 tablet (25 mg total) by mouth daily. 90 tablet 3   No current facility-administered medications for this visit.     Patient confirms/reports the following allergies:  Allergies  Allergen Reactions  . Enalapril   . Penicillins     No orders of the defined types were placed in this encounter.   AUTHORIZATION INFORMATION Primary Insurance: 1D#: Group #:  Secondary Insurance: 1D#: Group #:  SCHEDULE INFORMATION: Date: 07/01/2016  Time: Location: [facility]

## 2016-04-23 NOTE — Telephone Encounter (Signed)
Screening Colonoscopy Z12.11 [facility] 07/01/2016 Please pre cert  

## 2016-05-05 DIAGNOSIS — H5210 Myopia, unspecified eye: Secondary | ICD-10-CM | POA: Diagnosis not present

## 2016-05-05 DIAGNOSIS — H524 Presbyopia: Secondary | ICD-10-CM | POA: Diagnosis not present

## 2016-05-07 ENCOUNTER — Ambulatory Visit: Payer: Commercial Managed Care - HMO | Admitting: Obstetrics and Gynecology

## 2016-05-15 ENCOUNTER — Encounter: Payer: Self-pay | Admitting: Obstetrics and Gynecology

## 2016-05-15 ENCOUNTER — Ambulatory Visit (INDEPENDENT_AMBULATORY_CARE_PROVIDER_SITE_OTHER): Payer: Commercial Managed Care - HMO | Admitting: Obstetrics and Gynecology

## 2016-05-15 VITALS — BP 151/71 | HR 52 | Ht 61.0 in | Wt 144.6 lb

## 2016-05-15 DIAGNOSIS — N952 Postmenopausal atrophic vaginitis: Secondary | ICD-10-CM | POA: Diagnosis not present

## 2016-05-15 DIAGNOSIS — IMO0001 Reserved for inherently not codable concepts without codable children: Secondary | ICD-10-CM

## 2016-05-15 DIAGNOSIS — N8110 Cystocele, unspecified: Secondary | ICD-10-CM | POA: Diagnosis not present

## 2016-05-15 DIAGNOSIS — IMO0002 Reserved for concepts with insufficient information to code with codable children: Principal | ICD-10-CM

## 2016-05-15 NOTE — Progress Notes (Signed)
    GYNECOLOGY PROGRESS NOTE  Subjective:    Patient ID: Lauren Manning, female    DOB: 02/14/1936, 80 y.o.   MRN: [id number]  HPI  Patient is a 80 y.o. 872P2002 female who presents for pessary insertion for Grade 3 cystocele. Originally wanted to consider surgery, but is scheduled to have several other procedures done over the next few months (cataract surgery and colonoscopy), and her daughter would prefer for her to hold off on surgery for now.    The following portions of the patient's history were reviewed and updated as appropriate: allergies, current medications, past family history, past medical history, past social history, past surgical history and problem list.   Review of Systems Pertinent items noted in HPI and remainder of comprehensive ROS otherwise negative.   Objective:   Blood pressure (!) 151/71, pulse (!) 52, height 5\' 1"  (1.549 m), weight 144 lb 9.6 oz (65.6 kg). General appearance: alert and no distress Abdomen: soft, non-tender; bowel sounds normal; no masses,  no organomegaly Pelvic: External genitalia: normal general appearance  Urinary system: urethral meatus normal  Vaginal: atrophic mucosa and cystocele present, Grade 3  Cervix: atrophic, flushed to vaginal wall  Adnexa: non palpable  Uterus: normal single, nontender and atrophic  Rectal: deferred Extremities: extremities normal, atraumatic, no cyanosis or edema Neurologic: Grossly normal   Assessment:   Cystocele grade 3 Vaginal atrophy  Plan:   Pessary sizing performed today, will order size 2 3/4 Gelhorn pessary.  RTC in 1-2 weeks for insertion.   Will need treatment with vaginal estrogen cream. Will start once pessary arrives.    Hildred LaserAnika , MD [facility]

## 2016-05-19 DIAGNOSIS — N8110 Cystocele, unspecified: Secondary | ICD-10-CM | POA: Insufficient documentation

## 2016-05-19 DIAGNOSIS — N952 Postmenopausal atrophic vaginitis: Secondary | ICD-10-CM | POA: Insufficient documentation

## 2016-05-19 HISTORY — DX: Cystocele, unspecified: N81.10

## 2016-06-04 ENCOUNTER — Other Ambulatory Visit: Payer: Commercial Managed Care - HMO

## 2016-06-04 ENCOUNTER — Ambulatory Visit: Payer: Commercial Managed Care - HMO | Admitting: Oncology

## 2016-06-09 HISTORY — DX: Hereditary hemochromatosis: E83.110

## 2016-06-09 NOTE — Progress Notes (Signed)
[facility]  Telephone:([phone] [phone]  ID: Lauren Manning OB: 11/18/1935  MR#: [id number]  QIO#:[id number]#:[id number]  Patient Care Team: Margaretann LovelessJennifer M Burnette, PA-C as PCP - General (Family Medicine)  CHIEF COMPLAINT: Hereditary hemochromatosis with single gene mutation at H63D.  INTERVAL HISTORY: Patient returns to clinic today for further evaluation, repeat laboratory work, and discussion of her results. She currently feels well and is asymptomatic. She does not complain of weakness or fatigue today. She has no neurologic complaints. She denies any recent fevers or illnesses. She has a good appetite and denies weight loss. She denies any chest pain or shortness of breath. She denies any nausea, vomiting, constipation, or diarrhea. She has no urinary complaints. Patient offers no specific complaints today.  REVIEW OF SYSTEMS:   Review of Systems  Constitutional: Negative.  Negative for fever, malaise/fatigue and weight loss.  Respiratory: Negative.  Negative for cough and shortness of breath.   Cardiovascular: Negative.  Negative for chest pain and leg swelling.  Gastrointestinal: Negative.  Negative for abdominal pain, blood in stool and melena.  Genitourinary: Negative.   Musculoskeletal: Negative.   Neurological: Negative.  Negative for weakness.  Psychiatric/Behavioral: Negative.  The patient is not nervous/anxious.     As per HPI. Otherwise, a complete review of systems is negative.  PAST MEDICAL HISTORY: Past Medical History:  Diagnosis Date  . Arthritis    Gout  . Colon polyp   . Diverticulosis   . GERD (gastroesophageal reflux disease)   . Hyperglycemia   . Hyperlipidemia   . Hypertension   . Memory impairment   . Multiple contusions of trunk   . Osteopenia     PAST SURGICAL HISTORY: Past Surgical History:  Procedure Laterality Date  . CHOLECYSTECTOMY  04/16/2004   Dr. Orlene ErmKincuss. Anterior and posterior colporrhaphy  . history of colon  polyps  1999  . PARTIAL HYSTERECTOMY    . TUBAL LIGATION      FAMILY HISTORY Family History  Problem Relation Age of Onset  . Prostate cancer Father   . Breast cancer Daughter [id number]    breast cancer       ADVANCED DIRECTIVES:    HEALTH MAINTENANCE: Social History  Substance Use Topics  . Smoking status: Never Smoker  . Smokeless tobacco: Never Used  . Alcohol use No     Colonoscopy:  PAP:  Bone density:  Lipid panel:  Allergies  Allergen Reactions  . Enalapril   . Penicillins     Current Outpatient Prescriptions  Medication Sig Dispense Refill  . amLODipine (NORVASC) 10 MG tablet Take 1 tablet (10 mg total) by mouth daily. 30 tablet 3  . aspirin 81 MG tablet Take 81 mg by mouth daily.    Marland Kitchen. donepezil (ARICEPT) 5 MG tablet Take 1 tablet (5 mg total) by mouth at bedtime. 90 tablet 3  . hydrochlorothiazide (HYDRODIURIL) 12.5 MG tablet Take 1 tablet (12.5 mg total) by mouth daily. 30 tablet 3  . lovastatin (MEVACOR) 40 MG tablet Take 1 tablet (40 mg total) by mouth at bedtime. 30 tablet 3  . metoprolol succinate (TOPROL-XL) 25 MG 24 hr tablet Take 1 tablet (25 mg total) by mouth daily. 90 tablet 3  . mupirocin ointment (BACTROBAN) 2 %      No current facility-administered medications for this visit.     OBJECTIVE: Vitals:   06/10/16 1426  BP: (!) 176/85  Pulse: 65  Resp: 18  Temp: (!) 96.1 F (35.6 C)  Body mass index is 27.93 kg/m.    ECOG FS:0 - Asymptomatic  General: Well-developed, well-nourished, no acute distress. Eyes: Pink conjunctiva, anicteric sclera. Lungs: Clear to auscultation bilaterally. Heart: Regular rate and rhythm. No rubs, murmurs, or gallops. Abdomen: Soft, nontender, nondistended. No organomegaly noted, normoactive bowel sounds. Musculoskeletal: No edema, cyanosis, or clubbing. Neuro: Alert, answering all questions appropriately. Cranial nerves grossly intact. Skin: No rashes or petechiae noted. Psych: Normal affect.    LAB  RESULTS:  Lab Results  Component Value Date   NA 137 06/10/2016   K 3.1 (L) 06/10/2016   CL 107 06/10/2016   CO2 25 06/10/2016   GLUCOSE 106 (H) 06/10/2016   BUN 21 (H) 06/10/2016   CREATININE 0.91 06/10/2016   CALCIUM 8.9 06/10/2016   PROT 7.0 12/13/2015   ALBUMIN 4.2 12/13/2015   AST 23 12/13/2015   ALT 19 12/13/2015   ALKPHOS 68 12/13/2015   BILITOT 0.4 12/13/2015   GFRNONAA 59 (L) 06/10/2016   GFRAA >60 06/10/2016    Lab Results  Component Value Date   WBC 5.6 06/10/2016   NEUTROABS 4.6 02/27/2016   HGB 14.9 06/10/2016   HCT 43.1 06/10/2016   MCV 90.1 06/10/2016   PLT 219 06/10/2016   Lab Results  Component Value Date   IRON 75 03/18/2016   TIBC 420 03/18/2016   IRONPCTSAT 18 03/18/2016   Lab Results  Component Value Date   FERRITIN 188 03/18/2016     STUDIES: No results found.  ASSESSMENT: Hereditary hemochromatosis with single gene mutation at H63D  PLAN:    1. Hereditary hemochromatosis with single gene mutation at H63D: Patient's with a single gene mutation typically do not have symptoms of iron overload. Her iron stores are within normal limits. Her hemoglobin has trended down and is now also within normal limits. The remainder of her blood work including JAK-2 mutation are either negative or within normal limits. Patient does not require phlebotomy at this time. Return to clinic in 6 months with repeat laboratory work and further evaluation.  2. Hypertension: Blood pressure significantly elevated today. Treatment per PCP. 3. Hypercalcemia: Patient's calcium is now within normal limits.  Patient expressed understanding and was in agreement with this plan. She also understands that She can call clinic at any time with any questions, concerns, or complaints.    Jeralyn Ruths, MD   06/10/2016 4:03 PM

## 2016-06-10 ENCOUNTER — Inpatient Hospital Stay: Payer: Commercial Managed Care - HMO | Attending: Oncology | Admitting: Oncology

## 2016-06-10 ENCOUNTER — Inpatient Hospital Stay: Payer: Commercial Managed Care - HMO

## 2016-06-10 ENCOUNTER — Encounter: Payer: Self-pay | Admitting: Oncology

## 2016-06-10 DIAGNOSIS — M1990 Unspecified osteoarthritis, unspecified site: Secondary | ICD-10-CM | POA: Diagnosis not present

## 2016-06-10 DIAGNOSIS — Z79899 Other long term (current) drug therapy: Secondary | ICD-10-CM | POA: Diagnosis not present

## 2016-06-10 DIAGNOSIS — K219 Gastro-esophageal reflux disease without esophagitis: Secondary | ICD-10-CM | POA: Insufficient documentation

## 2016-06-10 DIAGNOSIS — D751 Secondary polycythemia: Secondary | ICD-10-CM

## 2016-06-10 DIAGNOSIS — E785 Hyperlipidemia, unspecified: Secondary | ICD-10-CM | POA: Diagnosis not present

## 2016-06-10 DIAGNOSIS — I10 Essential (primary) hypertension: Secondary | ICD-10-CM

## 2016-06-10 LAB — CBC
HCT: 43.1 % (ref 35.0–47.0)
Hemoglobin: 14.9 g/dL (ref 12.0–16.0)
MCH: 31.2 pg (ref 26.0–34.0)
MCHC: 34.7 g/dL (ref 32.0–36.0)
MCV: 90.1 fL (ref 80.0–100.0)
PLATELETS: 219 10*3/uL (ref 150–440)
RBC: 4.78 MIL/uL (ref 3.80–5.20)
RDW: 13.2 % (ref 11.5–14.5)
WBC: 5.6 10*3/uL (ref 3.6–11.0)

## 2016-06-10 LAB — BASIC METABOLIC PANEL
Anion gap: 5 (ref 5–15)
BUN: 21 mg/dL — AB (ref 6–20)
CALCIUM: 8.9 mg/dL (ref 8.9–10.3)
CHLORIDE: 107 mmol/L (ref 101–111)
CO2: 25 mmol/L (ref 22–32)
CREATININE: 0.91 mg/dL (ref 0.44–1.00)
GFR calc Af Amer: >60 mL/min (ref >60)
GFR calc non Af Amer: 59 mL/min — ABNORMAL LOW (ref >60)
GLUCOSE: 106 mg/dL — AB (ref 65–99)
Potassium: 3.1 mmol/L — ABNORMAL LOW (ref 3.5–5.1)
Sodium: 137 mmol/L (ref 135–145)

## 2016-06-10 NOTE — Progress Notes (Signed)
States has had diarrhea since yesterday but pt states that diarrhea has improved since this morning.

## 2016-06-12 DIAGNOSIS — H2512 Age-related nuclear cataract, left eye: Secondary | ICD-10-CM | POA: Diagnosis not present

## 2016-06-17 ENCOUNTER — Other Ambulatory Visit: Payer: Self-pay | Admitting: Physician Assistant

## 2016-06-17 DIAGNOSIS — I10 Essential (primary) hypertension: Secondary | ICD-10-CM

## 2016-06-17 MED ORDER — AMLODIPINE BESYLATE 10 MG PO TABS: 10.0000 mg | ORAL_TABLET | Freq: Every day | ORAL | 5 refills | Status: DC

## 2016-06-17 NOTE — Telephone Encounter (Signed)
Pt needs refill on her amlodipine  10mg   [facility]  Thanks Barth Kirkseri

## 2016-07-01 ENCOUNTER — Ambulatory Visit: Payer: Commercial Managed Care - HMO | Admitting: Anesthesiology

## 2016-07-01 ENCOUNTER — Encounter
Admission: RE | Disposition: A | Discharge status: Home / Self Care | Payer: Self-pay | Source: Ambulatory Visit | Attending: Gastroenterology

## 2016-07-01 ENCOUNTER — Ambulatory Visit
Admission: RE | Admit: 2016-07-01 | Discharge: 2016-07-01 | Disposition: A | Discharge status: Home / Self Care | Payer: Commercial Managed Care - HMO | Source: Ambulatory Visit | Attending: Gastroenterology | Admitting: Gastroenterology

## 2016-07-01 DIAGNOSIS — M109 Gout, unspecified: Secondary | ICD-10-CM | POA: Diagnosis not present

## 2016-07-01 DIAGNOSIS — K219 Gastro-esophageal reflux disease without esophagitis: Secondary | ICD-10-CM | POA: Diagnosis not present

## 2016-07-01 DIAGNOSIS — I10 Essential (primary) hypertension: Secondary | ICD-10-CM | POA: Diagnosis not present

## 2016-07-01 DIAGNOSIS — E785 Hyperlipidemia, unspecified: Secondary | ICD-10-CM | POA: Diagnosis not present

## 2016-07-01 DIAGNOSIS — Z1211 Encounter for screening for malignant neoplasm of colon: Secondary | ICD-10-CM | POA: Insufficient documentation

## 2016-07-01 DIAGNOSIS — Z79899 Other long term (current) drug therapy: Secondary | ICD-10-CM | POA: Insufficient documentation

## 2016-07-01 DIAGNOSIS — Z7982 Long term (current) use of aspirin: Secondary | ICD-10-CM | POA: Insufficient documentation

## 2016-07-01 DIAGNOSIS — K641 Second degree hemorrhoids: Secondary | ICD-10-CM | POA: Insufficient documentation

## 2016-07-01 DIAGNOSIS — Z860100 Personal history of colon polyps, unspecified: Secondary | ICD-10-CM

## 2016-07-01 DIAGNOSIS — K5790 Diverticulosis of intestine, part unspecified, without perforation or abscess without bleeding: Secondary | ICD-10-CM | POA: Diagnosis not present

## 2016-07-01 DIAGNOSIS — K5730 Diverticulosis of large intestine without perforation or abscess without bleeding: Secondary | ICD-10-CM | POA: Insufficient documentation

## 2016-07-01 DIAGNOSIS — M1990 Unspecified osteoarthritis, unspecified site: Secondary | ICD-10-CM | POA: Insufficient documentation

## 2016-07-01 DIAGNOSIS — R739 Hyperglycemia, unspecified: Secondary | ICD-10-CM | POA: Insufficient documentation

## 2016-07-01 DIAGNOSIS — K648 Other hemorrhoids: Secondary | ICD-10-CM | POA: Diagnosis not present

## 2016-07-01 DIAGNOSIS — M8580 Other specified disorders of bone density and structure, unspecified site: Secondary | ICD-10-CM | POA: Diagnosis not present

## 2016-07-01 HISTORY — PX: COLONOSCOPY WITH PROPOFOL: SHX5780

## 2016-07-01 SURGERY — COLONOSCOPY WITH PROPOFOL
Anesthesia: General

## 2016-07-01 MED ORDER — LIDOCAINE HCL (CARDIAC) 20 MG/ML IV SOLN
INTRAVENOUS | Status: DC | PRN
  Administered 2016-07-01: 100 mg via INTRAVENOUS

## 2016-07-01 MED ORDER — PROPOFOL 10 MG/ML IV BOLUS
INTRAVENOUS | Status: DC | PRN
Start: 2016-07-01 — End: 2016-07-01
  Administered 2016-07-01: 50 mg via INTRAVENOUS

## 2016-07-01 MED ORDER — PROPOFOL 500 MG/50ML IV EMUL
INTRAVENOUS | Status: DC | PRN
  Administered 2016-07-01: 100 ug/kg/min via INTRAVENOUS

## 2016-07-01 MED ORDER — SODIUM CHLORIDE 0.9 % IV SOLN
INTRAVENOUS | Status: DC
  Administered 2016-07-01: 08:00:00 via INTRAVENOUS

## 2016-07-01 MED ORDER — FENTANYL CITRATE (PF) 100 MCG/2ML IJ SOLN
INTRAMUSCULAR | Status: DC | PRN
  Administered 2016-07-01: 50 ug via INTRAVENOUS

## 2016-07-01 NOTE — Transfer of Care (Signed)
Immediate Anesthesia Transfer of Care Note  Patient: Lauren NySandra L Manning  Procedure(s) Performed: Procedure(s): COLONOSCOPY WITH PROPOFOL (N/A)  Patient Location: PACU  Anesthesia Type:General  Level of Consciousness: sedated  Airway & Oxygen Therapy: Patient Spontanous Breathing and Patient connected to nasal cannula oxygen  Post-op Assessment: Report given to RN and Post -op Vital signs reviewed and stable  Post vital signs: Reviewed and stable  Last Vitals:  Vitals:   07/01/16 0833 07/01/16 0835  BP: (!) 99/52 (!) 99/52  Pulse: (!) 54 (!) 50  Resp: 17 19  Temp: 36.2 C 36.2 C    Last Pain:  Vitals:   07/01/16 0833  TempSrc: Tympanic         Complications: No apparent anesthesia complications

## 2016-07-01 NOTE — H&P (Signed)
Midge Minium, MD [facility] [address] [place], [place] [address] Phone: [phone] Fax : [phone]  Primary Care Physician:  Margaretann Loveless, PA-C Primary Gastroenterologist:  Dr. Servando Snare  Pre-Procedure History & Physical: HPI:  Lauren Manning is a 80 y.o. female is here for an colonoscopy.   Past Medical History:  Diagnosis Date  . Arthritis    Gout  . Colon polyp   . Diverticulosis   . GERD (gastroesophageal reflux disease)   . Hyperglycemia   . Hyperlipidemia   . Hypertension   . Memory impairment   . Multiple contusions of trunk   . Osteopenia     Past Surgical History:  Procedure Laterality Date  . CHOLECYSTECTOMY  04/16/2004   Dr. Orlene Erm. Anterior and posterior colporrhaphy  . history of colon polyps  1999  . PARTIAL HYSTERECTOMY    . TUBAL LIGATION      Prior to Admission medications   Medication Sig Start Date End Date Taking? Authorizing Provider  amLODipine (NORVASC) 10 MG tablet Take 1 tablet (10 mg total) by mouth daily. 06/17/16  Yes Alessandra Bevels Burnette, PA-C  aspirin 81 MG tablet Take 81 mg by mouth daily.   Yes Historical Provider, MD  donepezil (ARICEPT) 5 MG tablet Take 1 tablet (5 mg total) by mouth at bedtime. 11/23/15  Yes Lorie Phenix, MD  hydrochlorothiazide (HYDRODIURIL) 12.5 MG tablet Take 1 tablet (12.5 mg total) by mouth daily. 02/08/16  Yes Lorie Phenix, MD  lovastatin (MEVACOR) 40 MG tablet Take 1 tablet (40 mg total) by mouth at bedtime. 02/08/16  Yes Lorie Phenix, MD  metoprolol succinate (TOPROL-XL) 25 MG 24 hr tablet Take 1 tablet (25 mg total) by mouth daily. 03/19/16  Yes Margaretann Loveless, PA-C  mupirocin ointment (BACTROBAN) 2 %  02/15/16  Yes Historical Provider, MD    Allergies as of 04/23/2016 - Review Complete 04/23/2016  Allergen Reaction Noted  . Enalapril  11/21/2015  . Penicillins  11/21/2015    Family History  Problem Relation Age of Onset  . Prostate cancer Father   . Breast cancer Daughter 45    breast  cancer    Social History   Social History  . Marital status: Single    Spouse name: N/A  . Number of children: N/A  . Years of education: N/A   Occupational History  . Not on file.   Social History Main Topics  . Smoking status: Never Smoker  . Smokeless tobacco: Never Used  . Alcohol use No  . Drug use: No  . Sexual activity: Not Currently   Other Topics Concern  . Not on file   Social History Narrative  . No narrative on file    Review of Systems: See HPI, otherwise negative ROS  Physical Exam: Pulse 68   Temp 97.1 F (36.2 C) (Tympanic)   Resp 16   Ht 5\' 2"  (1.575 m)   Wt 142 lb (64.4 kg)   BMI 25.97 kg/m  General:   Alert,  pleasant and cooperative in NAD Head:  Normocephalic and atraumatic. Neck:  Supple; no masses or thyromegaly. Lungs:  Clear throughout to auscultation.    Heart:  Regular rate and rhythm. Abdomen:  Soft, nontender and nondistended. Normal bowel sounds, without guarding, and without rebound.   Neurologic:  Alert and  oriented x4;  grossly normal neurologically.  Impression/Plan: Lauren Manning is here for an colonoscopy to be performed for history of polyps  Risks, benefits, limitations, and alternatives regarding  colonoscopy have been reviewed with the patient.  Questions have been answered.  All parties agreeable.   Midge Miniumarren , MD  07/01/2016, 8:11 AM

## 2016-07-01 NOTE — Op Note (Signed)
[facility] Patient Name: Lauren Manning Procedure Date: 07/01/2016 8:16 AM MRN: [id number] Account #: [id number] Date of Birth: 11-16-1935 Admit Type: Outpatient Age: 80 Room: [facility] ROOM 4 Gender: Female Note Status: Finalized Procedure:            Colonoscopy Indications:          High risk colon cancer surveillance: Personal history                        of colonic polyps Providers:            Midge Minium MD, MD Referring MD:         Leo Grosser, MD (Referring MD) Medicines:            Propofol per Anesthesia Complications:        No immediate complications. Procedure:            Pre-Anesthesia Assessment:                       - Prior to the procedure, a History and Physical was                        performed, and patient medications and allergies were                        reviewed. The patient's tolerance of previous                        anesthesia was also reviewed. The risks and benefits of                        the procedure and the sedation options and risks were                        discussed with the patient. All questions were                        answered, and informed consent was obtained. Prior                        Anticoagulants: The patient has taken no previous                        anticoagulant or antiplatelet agents. ASA Grade                        Assessment: II - A patient with mild systemic disease.                        After reviewing the risks and benefits, the patient was                        deemed in satisfactory condition to undergo the                        procedure.                       After obtaining informed consent, the colonoscope was  passed under direct vision. Throughout the procedure,                        the patient's blood pressure, pulse, and oxygen                        saturations were monitored continuously. The                        Colonoscope  was introduced through the anus and                        advanced to the the cecum, identified by appendiceal                        orifice and ileocecal valve. The colonoscopy was                        performed without difficulty. The patient tolerated the                        procedure well. The quality of the bowel preparation                        was fair. Findings:      The perianal and digital rectal examinations were normal.      Non-bleeding internal hemorrhoids were found during retroflexion. The       hemorrhoids were Grade II (internal hemorrhoids that prolapse but reduce       spontaneously).      A few small-mouthed diverticula were found in the entire colon. Impression:           - Preparation of the colon was fair.                       - Non-bleeding internal hemorrhoids.                       - Diverticulosis in the entire examined colon.                       - No specimens collected. Recommendation:       - Discharge patient to home.                       - Resume previous diet.                       - Continue present medications.                       - Repeat colonoscopy in 5 years for surveillance. Procedure Code(s):    --- Professional ---                       [id number], Colonoscopy, flexible; diagnostic, including                        collection of specimen(s) by brushing or washing, when                        performed (separate procedure) Diagnosis Code(s):    --- Professional ---  Z86.010, Personal history of colonic polyps CPT copyright 2016 American Medical Association. All rights reserved. The codes documented in this report are preliminary and upon coder review may  be revised to meet current compliance requirements. Midge Minium MD, MD 07/01/2016 8:31:50 AM This report has been signed electronically. Number of Addenda: 0 Note Initiated On: 07/01/2016 8:16 AM Scope Withdrawal Time: 0 hours 6 minutes 30 seconds  Total  Procedure Duration: 0 hours 10 minutes 7 seconds       [facility]

## 2016-07-01 NOTE — Anesthesia Preprocedure Evaluation (Signed)
Anesthesia Evaluation  Patient identified by MRN, date of birth, ID band Patient awake    Reviewed: Allergy & Precautions, H&P , NPO status , Patient's Chart, lab work & pertinent test results, reviewed documented beta blocker date and time   Airway Mallampati: II   Neck ROM: full    Dental  (+) Poor Dentition   Pulmonary neg pulmonary ROS,    Pulmonary exam normal        Cardiovascular hypertension, negative cardio ROS Normal cardiovascular exam     Neuro/Psych negative neurological ROS  negative psych ROS   GI/Hepatic negative GI ROS, Neg liver ROS, GERD  Medicated,  Endo/Other  negative endocrine ROS  Renal/GU negative Renal ROS  negative genitourinary   Musculoskeletal   Abdominal   Peds  Hematology negative hematology ROS (+)   Anesthesia Other Findings Past Medical History: No date: Arthritis     Comment: Gout No date: Colon polyp No date: Diverticulosis No date: GERD (gastroesophageal reflux disease) No date: Hyperglycemia No date: Hyperlipidemia No date: Hypertension No date: Memory impairment No date: Multiple contusions of trunk No date: Osteopenia Past Surgical History: 04/16/2004: CHOLECYSTECTOMY     Comment: Dr. Orlene ErmKincuss. Anterior and posterior               colporrhaphy 1999: history of colon polyps No date: PARTIAL HYSTERECTOMY No date: TUBAL LIGATION BMI    Body Mass Index:  25.97 kg/m     Reproductive/Obstetrics                             Anesthesia Physical Anesthesia Plan  ASA: II  Anesthesia Plan: General   Post-op Pain Management:    Induction:   Airway Management Planned:   Additional Equipment:   Intra-op Plan:   Post-operative Plan:   Informed Consent: I have reviewed the patients History and Physical, chart, labs and discussed the procedure including the risks, benefits and alternatives for the proposed anesthesia with the patient or  authorized representative who has indicated his/her understanding and acceptance.   Dental Advisory Given  Plan Discussed with: CRNA  Anesthesia Plan Comments:         Anesthesia Quick Evaluation

## 2016-07-01 NOTE — OR Nursing (Signed)
Poor prep °

## 2016-07-02 ENCOUNTER — Encounter: Payer: Self-pay | Admitting: Gastroenterology

## 2016-07-02 NOTE — Anesthesia Postprocedure Evaluation (Signed)
Anesthesia Post Note  Patient: Lauren Manning  Procedure(s) Performed: Procedure(s) (LRB): COLONOSCOPY WITH PROPOFOL (N/A)  Patient location during evaluation: PACU Anesthesia Type: General Level of consciousness: awake and alert Pain management: pain level controlled Vital Signs Assessment: post-procedure vital signs reviewed and stable Respiratory status: spontaneous breathing, nonlabored ventilation, respiratory function stable and patient connected to nasal cannula oxygen Cardiovascular status: blood pressure returned to baseline and stable Postop Assessment: no signs of nausea or vomiting Anesthetic complications: no    Last Vitals:  Vitals:   07/01/16 0843 07/01/16 0853  BP: (!) 142/52 130/69  Pulse: (!) 59 (!) 52  Resp: 18 18  Temp:      Last Pain:  Vitals:   07/02/16 0719  TempSrc:   PainSc: 0-No pain                 Yevette EdwardsJames G Adams

## 2016-07-08 ENCOUNTER — Ambulatory Visit (INDEPENDENT_AMBULATORY_CARE_PROVIDER_SITE_OTHER): Payer: Commercial Managed Care - HMO | Admitting: Obstetrics and Gynecology

## 2016-07-08 VITALS — BP 144/67 | HR 62 | Ht 61.0 in | Wt 148.2 lb

## 2016-07-08 DIAGNOSIS — N8110 Cystocele, unspecified: Secondary | ICD-10-CM

## 2016-07-08 DIAGNOSIS — Z4689 Encounter for fitting and adjustment of other specified devices: Secondary | ICD-10-CM | POA: Diagnosis not present

## 2016-07-08 NOTE — Patient Instructions (Signed)
Use Trimo-San Gel once weekly.

## 2016-07-08 NOTE — Progress Notes (Signed)
    GYNECOLOGY PROGRESS NOTE  Subjective:    Patient ID: Lauren Manning, female    DOB: 09/23/1935, 80 y.o.   MRN: [id number]  HPI  Patient is a 80 y.o. 362P2002 female who presents for pessary insertion for 3rd degree cystocele. Denies major complaints today.   The following portions of the patient's history were reviewed and updated as appropriate: allergies, current medications, past family history, past medical history, past social history, past surgical history and problem list.  Review of Systems Pertinent items noted in HPI and remainder of comprehensive ROS otherwise negative.   Objective:   Blood pressure (!) 144/67, pulse 62, height 5\' 1"  (1.549 m), weight 148 lb 3.2 oz (67.2 kg). General appearance: alert and no distress Abdomen: soft, non-tender; bowel sounds normal; no masses,  no organomegaly Pelvic: External genitalia: normal general appearance             Urinary system: urethral meatus normal             Vaginal: atrophic mucosa and cystocele present, Grade 3             Cervix: atrophic, flushed to vaginal wall             Adnexa: non palpable             Uterus: normal single, nontender and atrophic             Rectal: deferred Extremities: extremities normal, atraumatic, no cyanosis or edema Neurologic: Grossly normal   Assessment:  Cystocele Grade 3  Vaginal atrophy  Plan:   Pessary insertion performed today with size 2 3/4 Gelhorn pessary.  Advised on use of trimo-san gel once or twice weekly.  Patient declines use of estrogen cream for now.  RTC in 2 weeks for pessary check.    Hildred LaserAnika , MD [facility]

## 2016-07-15 DIAGNOSIS — H2512 Age-related nuclear cataract, left eye: Secondary | ICD-10-CM | POA: Diagnosis not present

## 2016-07-16 ENCOUNTER — Encounter: Payer: Self-pay | Admitting: *Deleted

## 2016-07-22 ENCOUNTER — Encounter: Payer: Self-pay | Admitting: *Deleted

## 2016-07-22 ENCOUNTER — Encounter
Admission: RE | Disposition: A | Discharge status: Home / Self Care | Payer: Self-pay | Source: Ambulatory Visit | Attending: Ophthalmology

## 2016-07-22 ENCOUNTER — Ambulatory Visit: Payer: Commercial Managed Care - HMO | Admitting: Certified Registered Nurse Anesthetist

## 2016-07-22 ENCOUNTER — Ambulatory Visit
Admission: RE | Admit: 2016-07-22 | Discharge: 2016-07-22 | Disposition: A | Discharge status: Home / Self Care | Payer: Commercial Managed Care - HMO | Source: Ambulatory Visit | Attending: Ophthalmology | Admitting: Ophthalmology

## 2016-07-22 DIAGNOSIS — H2512 Age-related nuclear cataract, left eye: Secondary | ICD-10-CM | POA: Diagnosis not present

## 2016-07-22 DIAGNOSIS — E1136 Type 2 diabetes mellitus with diabetic cataract: Secondary | ICD-10-CM | POA: Diagnosis not present

## 2016-07-22 DIAGNOSIS — Z79899 Other long term (current) drug therapy: Secondary | ICD-10-CM | POA: Insufficient documentation

## 2016-07-22 DIAGNOSIS — I10 Essential (primary) hypertension: Secondary | ICD-10-CM | POA: Diagnosis not present

## 2016-07-22 DIAGNOSIS — K219 Gastro-esophageal reflux disease without esophagitis: Secondary | ICD-10-CM | POA: Insufficient documentation

## 2016-07-22 DIAGNOSIS — E785 Hyperlipidemia, unspecified: Secondary | ICD-10-CM | POA: Diagnosis not present

## 2016-07-22 HISTORY — PX: CATARACT EXTRACTION W/PHACO: SHX586

## 2016-07-22 HISTORY — DX: Unspecified hearing loss, unspecified ear: H91.90

## 2016-07-22 SURGERY — PHACOEMULSIFICATION, CATARACT, WITH IOL INSERTION
Anesthesia: Monitor Anesthesia Care | Site: Eye | Laterality: Left | Wound class: Clean

## 2016-07-22 MED ORDER — SODIUM CHLORIDE 0.9 % IV SOLN
INTRAVENOUS | Status: DC
  Administered 2016-07-22: 10:00:00 via INTRAVENOUS

## 2016-07-22 MED ORDER — NA CHONDROIT SULF-NA HYALURON 40-17 MG/ML IO SOLN
INTRAOCULAR | Status: DC | PRN
  Administered 2016-07-22: 1 mL via INTRAOCULAR

## 2016-07-22 MED ORDER — ARMC OPHTHALMIC DILATING DROPS
1.0000 "application " | OPHTHALMIC | Status: AC
  Administered 2016-07-22 (×3): 1 via OPHTHALMIC

## 2016-07-22 MED ORDER — MOXIFLOXACIN HCL 0.5 % OP SOLN
OPHTHALMIC | Status: AC
  Administered 2016-07-22: 1 [drp] via OPHTHALMIC
  Filled 2016-07-22: qty 3

## 2016-07-22 MED ORDER — POVIDONE-IODINE 5 % OP SOLN
OPHTHALMIC | Status: AC
  Filled 2016-07-22: qty 30

## 2016-07-22 MED ORDER — CARBACHOL 0.01 % IO SOLN
INTRAOCULAR | Status: DC | PRN
  Administered 2016-07-22: 0.5 mL via INTRAOCULAR

## 2016-07-22 MED ORDER — ARMC OPHTHALMIC DILATING DROPS
OPHTHALMIC | Status: AC
  Administered 2016-07-22: 1 via OPHTHALMIC
  Filled 2016-07-22: qty 0.4

## 2016-07-22 MED ORDER — LIDOCAINE HCL (PF) 4 % IJ SOLN
INTRAMUSCULAR | Status: AC
  Filled 2016-07-22: qty 5

## 2016-07-22 MED ORDER — LIDOCAINE HCL (PF) 4 % IJ SOLN
INTRAOCULAR | Status: DC | PRN
  Administered 2016-07-22: 4 mL via OPHTHALMIC

## 2016-07-22 MED ORDER — EPINEPHRINE PF 1 MG/ML IJ SOLN
INTRAMUSCULAR | Status: AC
  Filled 2016-07-22: qty 2

## 2016-07-22 MED ORDER — FENTANYL CITRATE (PF) 100 MCG/2ML IJ SOLN
INTRAMUSCULAR | Status: DC | PRN
  Administered 2016-07-22: 50 ug via INTRAVENOUS

## 2016-07-22 MED ORDER — POVIDONE-IODINE 5 % OP SOLN
OPHTHALMIC | Status: DC | PRN
  Administered 2016-07-22: 1 via OPHTHALMIC

## 2016-07-22 MED ORDER — EPINEPHRINE PF 1 MG/ML IJ SOLN
INTRAOCULAR | Status: DC | PRN
  Administered 2016-07-22: 1 mL via OPHTHALMIC

## 2016-07-22 MED ORDER — NA CHONDROIT SULF-NA HYALURON 40-17 MG/ML IO SOLN
INTRAOCULAR | Status: AC
  Filled 2016-07-22: qty 1

## 2016-07-22 MED ORDER — MOXIFLOXACIN HCL 0.5 % OP SOLN
OPHTHALMIC | Status: DC | PRN
  Administered 2016-07-22: 1 [drp] via OPHTHALMIC

## 2016-07-22 MED ORDER — MOXIFLOXACIN HCL 0.5 % OP SOLN
1.0000 [drp] | OPHTHALMIC | Status: DC
  Administered 2016-07-22 (×3): 1 [drp] via OPHTHALMIC

## 2016-07-22 SURGICAL SUPPLY — 22 items
CANNULA ANT/CHMB 27GA (MISCELLANEOUS) ×3 IMPLANT
CUP MEDICINE 2OZ PLAST GRAD ST (MISCELLANEOUS) ×3 IMPLANT
GLOVE BIO SURGEON STRL SZ8 (GLOVE) ×3 IMPLANT
GLOVE BIOGEL M 6.5 STRL (GLOVE) ×3 IMPLANT
GLOVE SURG LX 8.0 MICRO (GLOVE) ×2
GLOVE SURG LX STRL 8.0 MICRO (GLOVE) ×1 IMPLANT
GOWN STRL REUS W/ TWL LRG LVL3 (GOWN DISPOSABLE) ×2 IMPLANT
GOWN STRL REUS W/TWL LRG LVL3 (GOWN DISPOSABLE) ×4
LENS IOL ACRSF IQ PC 16.5 (Intraocular Lens) ×1 IMPLANT
LENS IOL ACRYSOF IQ POST 16.5 (Intraocular Lens) ×3 IMPLANT
PACK CATARACT (MISCELLANEOUS) ×3 IMPLANT
PACK CATARACT BRASINGTON LX (MISCELLANEOUS) ×3 IMPLANT
PACK EYE AFTER SURG (MISCELLANEOUS) ×3 IMPLANT
SOL BSS BAG (MISCELLANEOUS) ×3
SOL PREP PVP 2OZ (MISCELLANEOUS) ×3
SOLUTION BSS BAG (MISCELLANEOUS) ×1 IMPLANT
SOLUTION PREP PVP 2OZ (MISCELLANEOUS) ×1 IMPLANT
SYR 3ML LL SCALE MARK (SYRINGE) ×3 IMPLANT
SYR 5ML LL (SYRINGE) ×3 IMPLANT
SYR TB 1ML 27GX1/2 LL (SYRINGE) ×3 IMPLANT
WATER STERILE IRR 250ML POUR (IV SOLUTION) ×3 IMPLANT
WIPE NON LINTING 3.25X3.25 (MISCELLANEOUS) ×3 IMPLANT

## 2016-07-22 NOTE — Op Note (Signed)
PREOPERATIVE DIAGNOSIS:  Nuclear sclerotic cataract of the left eye.   POSTOPERATIVE DIAGNOSIS:  Nuclear sclerotic cataract of the left eye.   OPERATIVE PROCEDURE: Procedure(s): CATARACT EXTRACTION PHACO AND INTRAOCULAR LENS PLACEMENT (IOC)   SURGEON:  Galen ManilaWilliam , MD.   ANESTHESIA:  Anesthesiologist: Yevette EdwardsJames G Adams, MD CRNA: Malva Coganatherine Beane, CRNA  1.      Managed anesthesia care. 2.     0.431ml of Shugarcaine was instilled following the paracentesis   COMPLICATIONS:  None.   TECHNIQUE:   Stop and chop   DESCRIPTION OF PROCEDURE:  The patient was examined and consented in the preoperative holding area where the aforementioned topical anesthesia was applied to the left eye and then brought back to the Operating Room where the left eye was prepped and draped in the usual sterile ophthalmic fashion and a lid speculum was placed. A paracentesis was created with the side port blade and the anterior chamber was filled with viscoelastic. A near clear corneal incision was performed with the steel keratome. A continuous curvilinear capsulorrhexis was performed with a cystotome followed by the capsulorrhexis forceps. Hydrodissection and hydrodelineation were carried out with BSS on a blunt cannula. The lens was removed in a stop and chop  technique and the remaining cortical material was removed with the irrigation-aspiration handpiece. The capsular bag was inflated with viscoelastic and the Technis [id number] lens was placed in the capsular bag without complication. The remaining viscoelastic was removed from the eye with the irrigation-aspiration handpiece. The wounds were hydrated. The anterior chamber was flushed with Miostat and the eye was inflated to physiologic pressure. 0.251ml Vigamox was placed in the anterior chamber. The wounds were found to be water tight. The eye was dressed with Vigamox. The patient was given protective glasses to wear throughout the day and a shield with which to sleep  tonight. The patient was also given drops with which to begin a drop regimen today and will follow-up with me in one day.  Implant Name Type Inv. Item Serial No. Manufacturer Lot No. LRB No. Used  IMPLANT LENS - [id number] Intraocular Lens IMPLANT LENS [id number] Judithann SheenLCON [id number] Left 1    Procedure(s) with comments: CATARACT EXTRACTION PHACO AND INTRAOCULAR LENS PLACEMENT (IOC) (Left) - US 43.1 AP% 20.2 CDE 8.71 Fluid pack lot # [id number]  Electronically signed: , LOUIS 07/22/2016 11:08 AM

## 2016-07-22 NOTE — Anesthesia Postprocedure Evaluation (Signed)
Anesthesia Post Note  Patient: Lauren Manning  Procedure(s) Performed: Procedure(s) (LRB): CATARACT EXTRACTION PHACO AND INTRAOCULAR LENS PLACEMENT (IOC) (Left)  Patient location during evaluation: PACU Anesthesia Type: MAC Level of consciousness: awake and alert and oriented Pain management: satisfactory to patient Vital Signs Assessment: post-procedure vital signs reviewed and stable Respiratory status: respiratory function stable Cardiovascular status: stable Anesthetic complications: no    Last Vitals:  Vitals:   07/22/16 0946  BP: (!) 148/58  Pulse: (!) 56  Resp: 18  Temp: 36.3 C    Last Pain:  Vitals:   07/22/16 0946  TempSrc: Tympanic                 Clydene PughBeane,  D

## 2016-07-22 NOTE — Transfer of Care (Signed)
Immediate Anesthesia Transfer of Care Note  Patient: Kevan NySandra L Lusby  Procedure(s) Performed: Procedure(s) with comments: CATARACT EXTRACTION PHACO AND INTRAOCULAR LENS PLACEMENT (IOC) (Left) - US 43.1 AP% 20.2 CDE 8.71 Fluid pack lot # [id number]  Patient Location: PACU  Anesthesia Type:MAC  Level of Consciousness: awake, alert  and oriented  Airway & Oxygen Therapy: Patient Spontanous Breathing  Post-op Assessment: Report given to RN and Post -op Vital signs reviewed and stable  Post vital signs: Reviewed and stable  Last Vitals:  Vitals:   07/22/16 0946  BP: (!) 148/58  Pulse: (!) 56  Resp: 18  Temp: 36.3 C    Last Pain:  Vitals:   07/22/16 0946  TempSrc: Tympanic         Complications: No apparent anesthesia complications

## 2016-07-22 NOTE — Anesthesia Preprocedure Evaluation (Signed)
Anesthesia Evaluation  Patient identified by MRN, date of birth, ID band Patient awake    Reviewed: Allergy & Precautions, H&P , NPO status , Patient's Chart, lab work & pertinent test results, reviewed documented beta blocker date and time   Airway Mallampati: II  TM Distance: >3 FB Neck ROM: full    Dental no notable dental hx. (+) Teeth Intact   Pulmonary neg pulmonary ROS,    Pulmonary exam normal breath sounds clear to auscultation       Cardiovascular Exercise Tolerance: Good hypertension, negative cardio ROS   Rhythm:regular Rate:Normal     Neuro/Psych negative neurological ROS  negative psych ROS   GI/Hepatic negative GI ROS, Neg liver ROS, GERD  Medicated,  Endo/Other  negative endocrine ROSdiabetes  Renal/GU      Musculoskeletal   Abdominal   Peds  Hematology negative hematology ROS (+)   Anesthesia Other Findings   Reproductive/Obstetrics negative OB ROS                             Anesthesia Physical Anesthesia Plan  ASA: III  Anesthesia Plan: MAC   Post-op Pain Management:    Induction:   Airway Management Planned:   Additional Equipment:   Intra-op Plan:   Post-operative Plan:   Informed Consent: I have reviewed the patients History and Physical, chart, labs and discussed the procedure including the risks, benefits and alternatives for the proposed anesthesia with the patient or authorized representative who has indicated his/her understanding and acceptance.     Plan Discussed with: CRNA  Anesthesia Plan Comments:         Anesthesia Quick Evaluation

## 2016-07-22 NOTE — H&P (Signed)
All labs reviewed. Abnormal studies sent to patients PCP when indicated.  Previous H&P reviewed, patient examined, there are NO CHANGES.  , LOUIS10/31/201710:39 AM

## 2016-07-22 NOTE — Anesthesia Procedure Notes (Signed)
Procedure Name: MAC Performed by: Malva CoganBEANE,  Pre-anesthesia Checklist: Patient identified, Emergency Drugs available, Patient being monitored, Timeout performed and Suction available Oxygen Delivery Method: Nasal cannula

## 2016-07-22 NOTE — Discharge Instructions (Signed)
Eye Surgery Discharge Instructions ° °Expect mild scratchy sensation or mild soreness. °DO NOT RUB YOUR EYE! ° °The day of surgery: °• Minimal physical activity, but bed rest is not required °• No reading, computer work, or close hand work °• No bending, lifting, or straining. °• May watch TV ° °For 24 hours: °• No driving, legal decisions, or alcoholic beverages °• Safety precautions °• Eat anything you prefer: It is better to start with liquids, then soup then solid foods. °• _____ Eye patch should be worn until postoperative exam tomorrow. °• ____ Solar shield eyeglasses should be worn for comfort in the sunlight/patch while sleeping ° °Resume all regular medications including aspirin or Coumadin if these were discontinued prior to surgery. °You may shower, bathe, shave, or wash your hair. °Tylenol may be taken for mild discomfort. ° °Call your doctor if you experience significant pain, nausea, or vomiting, fever > 101 or other signs of infection. [phone] or [phone] °Specific instructions: ° °Follow-up Information   ° PORFILIO,WILLIAM LOUIS, MD .   °Specialty:  Ophthalmology °Why:  November 1 at 9:45am °Contact information: °[address] °[place] [address] °[phone] ° ° °  °  °  ° °

## 2016-07-24 ENCOUNTER — Encounter: Payer: Self-pay | Admitting: Obstetrics and Gynecology

## 2016-07-24 ENCOUNTER — Ambulatory Visit (INDEPENDENT_AMBULATORY_CARE_PROVIDER_SITE_OTHER): Payer: Commercial Managed Care - HMO | Admitting: Obstetrics and Gynecology

## 2016-07-24 VITALS — BP 138/60 | HR 55 | Ht 61.0 in | Wt 148.1 lb

## 2016-07-24 DIAGNOSIS — Z4689 Encounter for fitting and adjustment of other specified devices: Secondary | ICD-10-CM | POA: Diagnosis not present

## 2016-07-24 DIAGNOSIS — N814 Uterovaginal prolapse, unspecified: Secondary | ICD-10-CM | POA: Diagnosis not present

## 2016-07-24 DIAGNOSIS — N812 Incomplete uterovaginal prolapse: Secondary | ICD-10-CM

## 2016-07-24 DIAGNOSIS — N8110 Cystocele, unspecified: Secondary | ICD-10-CM | POA: Diagnosis not present

## 2016-07-24 NOTE — Patient Instructions (Signed)
Use Trimo-San gel at least 1-2 times weekly

## 2016-07-24 NOTE — Progress Notes (Signed)
    GYNECOLOGY PROGRESS NOTE  Subjective:    Patient ID: Lauren Manning, female    DOB: 09/20/1936, 80 y.o.   MRN: [id number]  HPI  Patient is a 80 y.o. 742P2002 female who presents for 2 week pessary check.  She reports no vaginal bleeding or discharge. She denies pelvic discomfort and difficulty urinating or moving her bowels.  The following portions of the patient's history were reviewed and updated as appropriate: allergies, current medications, past family history, past medical history, past social history, past surgical history and problem list.  Review of Systems A comprehensive review of systems was negative.   Objective:   Blood pressure 138/60, pulse (!) 55, height 5\' 1"  (1.549 m), weight 148 lb 1.6 oz (67.2 kg). General appearance: alert and no distress Abdomen: soft, non-tender; bowel sounds normal; no masses,  no organomegaly Pelvic: The patient's size 2 3/4 Gelhorn pessary was removed, cleaned and replaced without complications. Speculum examination revealed normal vaginal mucosa with no lesions or lacerations.  Assessment:   Encounter for pessary maintenance Cystocele Grade 3 with cervical prolapse  Plan:   The patient should return in 3 months for a pessary check and continue to use Trimo-San gel 1-2 times weekly as prescribed.    Hildred LaserAnika , MD [facility]

## 2016-08-27 ENCOUNTER — Encounter: Payer: Self-pay | Admitting: Physician Assistant

## 2016-08-27 ENCOUNTER — Ambulatory Visit (INDEPENDENT_AMBULATORY_CARE_PROVIDER_SITE_OTHER): Payer: Commercial Managed Care - HMO | Admitting: Physician Assistant

## 2016-08-27 VITALS — BP 116/70 | HR 56 | Temp 97.5°F | Resp 16 | Wt 151.0 lb

## 2016-08-27 DIAGNOSIS — Z820 Family history of epilepsy and other diseases of the nervous system: Secondary | ICD-10-CM | POA: Diagnosis not present

## 2016-08-27 DIAGNOSIS — R413 Other amnesia: Secondary | ICD-10-CM | POA: Diagnosis not present

## 2016-08-27 DIAGNOSIS — K591 Functional diarrhea: Secondary | ICD-10-CM

## 2016-08-27 DIAGNOSIS — I10 Essential (primary) hypertension: Secondary | ICD-10-CM | POA: Diagnosis not present

## 2016-08-27 NOTE — Progress Notes (Signed)
Patient: Lauren Manning Female    DOB: 12/12/1935   80 y.o.   MRN: [id number] Visit Date: 08/27/2016  Today's Provider: Margaretann LovelessJennifer M , PA-C   Chief Complaint  Patient presents with  . Hypertension  . Hyperlipidemia  . Memory Loss  . Diarrhea   Subjective:    HPI      Hypertension, follow-up:  BP Readings from Last 3 Encounters:  08/27/16 116/70  07/24/16 138/60  07/22/16 (!) 137/57    She was last seen for hypertension 6 months ago.  BP at that visit was 150/72. Management since that visit includes none. She reports excellent compliance with treatment. She is not having side effects. She is not exercising. She is adherent to low salt diet.   Outside blood pressures are being checked, pt forgot the log. She is experiencing palpitations.  Patient denies chest pain, chest pressure/discomfort, dyspnea, fatigue, lower extremity edema, near-syncope, orthopnea and syncope.   Cardiovascular risk factors include advanced age (older than 5455 for men, 565 for women), dyslipidemia and hypertension.    Weight trend: stable Wt Readings from Last 3 Encounters:  08/27/16 151 lb (68.5 kg)  07/24/16 148 lb 1.6 oz (67.2 kg)  07/22/16 145 lb (65.8 kg)   ------------------------------------------------------------------------    Lipid/Cholesterol, Follow-up:   Last seen for this 9 months ago.  Management changes since that visit include none. . Last Lipid Panel:    Component Value Date/Time   CHOL 236 (H) 11/23/2015 1004   TRIG 268 (H) 11/23/2015 1004   HDL 44 11/23/2015 1004   CHOLHDL 5.4 (H) 11/23/2015 1004   LDLCALC 138 (H) 11/23/2015 1004    She reports excellent compliance with treatment. She is not having side effects.    Wt Readings from Last 3 Encounters:  08/27/16 151 lb (68.5 kg)  07/24/16 148 lb 1.6 oz (67.2 kg)  07/22/16 145 lb (65.8 kg)    ------------------------------------------------------------------- Memory Loss Pt currently taking  Aricept 5 mg daily for this. Last 6CIT was performed on 03/10/2016, and was 4/28.  6CIT Screen 08/27/2016  What Year? 0 points  What month? 0 points  What time? 3 points  Count back from 20 0 points  Months in reverse 2 points  Repeat phrase 8 points  Total Score 13    Diarrhea: Patient complains of diarrhea. Onset of diarrhea was several years ago. Diarrhea is occurring approximately every 3-4 days. Patient describes diarrhea as semisolid and watery. Patient denies blood in stool, recent camping, recent travel, significant abdominal pain.  Previous visits for diarrhea: none.   Allergies  Allergen Reactions  . Enalapril Other (See Comments)    Pt. Denies this allergy  . Penicillins     Has patient had a PCN reaction causing immediate rash, facial/tongue/throat swelling, SOB or lightheadedness with hypotension:unsure Has patient had a PCN reaction causing severe rash involving mucus membranes or skin necrosis:unsure Has patient had a PCN reaction that required hospitalization:unsure Has patient had a PCN reaction occurring within the last 10 years:unsure If all of the above answers are "NO", then may proceed with Cephalosporin use.      Current Outpatient Prescriptions:  .  amLODipine (NORVASC) 10 MG tablet, Take 1 tablet (10 mg total) by mouth daily., Disp: 30 tablet, Rfl: 5 .  aspirin 81 MG tablet, Take 81 mg by mouth daily., Disp: , Rfl:  .  donepezil (ARICEPT) 5 MG tablet, Take 1 tablet (5 mg total) by mouth at bedtime., Disp: 90 tablet,  Rfl: 3 .  hydrochlorothiazide (HYDRODIURIL) 12.5 MG tablet, Take 1 tablet (12.5 mg total) by mouth daily., Disp: 30 tablet, Rfl: 3 .  lovastatin (MEVACOR) 40 MG tablet, Take 1 tablet (40 mg total) by mouth at bedtime., Disp: 30 tablet, Rfl: 3 .  metoprolol succinate (TOPROL-XL) 25 MG 24 hr tablet, Take 1 tablet (25 mg total) by mouth daily., Disp: 90 tablet, Rfl: 3  Review of Systems  Constitutional: Negative.   Respiratory: Negative.     Cardiovascular: Negative.   Gastrointestinal: Positive for constipation and diarrhea. Negative for abdominal distention, abdominal pain, anal bleeding, blood in stool, nausea, rectal pain and vomiting.  Endocrine: Negative.   Genitourinary: Negative.   Musculoskeletal: Negative.   Neurological: Negative for dizziness, syncope, weakness, light-headedness, numbness and headaches.       Memory loss  Psychiatric/Behavioral: Negative.     Social History  Substance Use Topics  . Smoking status: Never Smoker  . Smokeless tobacco: Never Used  . Alcohol use No   Objective:   BP 116/70 (BP Location: Left Arm, Patient Position: Sitting, Cuff Size: Normal)   Pulse (!) 56   Temp 97.5 F (36.4 C) (Oral)   Resp 16   Wt 151 lb (68.5 kg)   BMI 28.53 kg/m   Physical Exam  Constitutional: She is oriented to person, place, and time. She appears well-developed and well-nourished. No distress.  Cardiovascular: Normal rate, regular rhythm and normal heart sounds.  Exam reveals no gallop and no friction rub.   No murmur heard. Pulmonary/Chest: Effort normal and breath sounds normal. No respiratory distress. She has no wheezes. She has no rales.  Abdominal: Soft. Normal appearance and bowel sounds are normal. She exhibits no distension and no mass. There is no hepatosplenomegaly. There is no tenderness. There is no rebound, no guarding and no CVA tenderness.  Neurological: She is alert and oriented to person, place, and time.  Skin: Skin is warm and dry. She is not diaphoretic.  Vitals reviewed.      Assessment & Plan:     1. Memory loss Worsening results on 6 CIT today. Daughter reports that there has not been much change in memory that she notices but there is memory loss every day. Patient has been compliant with Aricept 5 mg. There is a family history of Alzheimer's disease in her mother. I will refer to low bowel or neurology for adult neurocognitive evaluation. I will follow-up pending the  evaluation report. - Ambulatory referral to Neurology  2. Family history of Alzheimer's disease See above medical treatment plan. - Ambulatory referral to Neurology  3. Essential (primary) hypertension Currently stable. Continue amlodipine 10 mg daily, hydrochlorothiazide 12.5 mg daily, metoprolol 25 mg daily. I will see her back in 6 months for blood pressure recheck and for her annual wellness visit.  4. Functional diarrhea Diarrhea seems to be functional in nature most likely secondary with constipation. Advised patient to increase fiber and water intake. She is to call if the thumbs do not improve or if they worsen.       Margaretann LovelessJennifer M , PA-C  [facility]

## 2016-08-27 NOTE — Patient Instructions (Signed)

## 2016-09-10 ENCOUNTER — Ambulatory Visit: Payer: Commercial Managed Care - HMO | Admitting: Physician Assistant

## 2016-09-26 ENCOUNTER — Other Ambulatory Visit: Payer: Self-pay

## 2016-09-26 ENCOUNTER — Telehealth: Payer: Self-pay | Admitting: Physician Assistant

## 2016-09-26 DIAGNOSIS — F0150 Vascular dementia without behavioral disturbance: Secondary | ICD-10-CM

## 2016-09-26 MED ORDER — DONEPEZIL HCL 5 MG PO TABS: 5.0000 mg | ORAL_TABLET | Freq: Every day | ORAL | 3 refills | Status: DC

## 2016-09-26 NOTE — Telephone Encounter (Signed)
Pt reports she is completely out of Aricept, and is questioning if she can go a few days without taking it sop it can be sent to [facility]. If it is urgent to take daily, [facility]. Please advise. Allene DillonEmily Drozdowski, CMA

## 2016-09-26 NOTE — Telephone Encounter (Signed)
FYI; apparently pt called earlier about the same medication question. Allene DillonEmily Drozdowski, CMA

## 2016-09-26 NOTE — Telephone Encounter (Signed)
Pt advised.  Drozdowski, CMA  

## 2016-09-26 NOTE — Telephone Encounter (Signed)
Pt contacted office for refill request on the following medications: donepezil (ARICEPT) 5 MG tablet 90 day supply to [facility] Pt stated that since Dr. Elease HashimotoMaloney wrote the Rx her remaining refills are no longer valid and she didn't know this and know she is almost completely out of the medication. Pt stated that if it won't hurt her to miss a few doses she is ok waiting on the mail order but if she doesn't need to miss any doses she would like 10 days worth sent [facility] so she doesn't run out. Please advise. Thanks TNP

## 2016-09-26 NOTE — Telephone Encounter (Signed)
Ok to go without

## 2016-10-28 ENCOUNTER — Ambulatory Visit (INDEPENDENT_AMBULATORY_CARE_PROVIDER_SITE_OTHER): Payer: Medicare HMO | Admitting: Obstetrics and Gynecology

## 2016-10-28 ENCOUNTER — Ambulatory Visit (INDEPENDENT_AMBULATORY_CARE_PROVIDER_SITE_OTHER): Payer: Medicare HMO | Admitting: Psychology

## 2016-10-28 ENCOUNTER — Encounter: Payer: Self-pay | Admitting: Obstetrics and Gynecology

## 2016-10-28 VITALS — BP 149/64 | HR 64 | Ht 61.0 in | Wt 150.4 lb

## 2016-10-28 DIAGNOSIS — N8110 Cystocele, unspecified: Secondary | ICD-10-CM | POA: Diagnosis not present

## 2016-10-28 DIAGNOSIS — F0390 Unspecified dementia without behavioral disturbance: Secondary | ICD-10-CM

## 2016-10-28 DIAGNOSIS — Z4689 Encounter for fitting and adjustment of other specified devices: Secondary | ICD-10-CM
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
## 2016-10-28 NOTE — Progress Notes (Signed)
    GYNECOLOGY PROGRESS NOTE  Subjective:    Patient ID: Lauren Manning, female    DOB: 09/03/1936, 81 y.o.   MRN: [id number]  HPI  Patient is a 81 y.o. 352P2002 female who presents for 3 month pessary check.  She reports no vaginal bleeding or discharge. She denies pelvic discomfort and difficulty urinating or moving her bowels.  The following portions of the patient's history were reviewed and updated as appropriate: allergies, current medications, past family history, past medical history, past social history, past surgical history and problem list.  Review of Systems A comprehensive review of systems was negative.   Objective:   Blood pressure (!) 149/64, pulse 64, height 5\' 1"  (1.549 m), weight 150 lb 6.4 oz (68.2 kg). General appearance: alert and no distress Abdomen: soft, non-tender; bowel sounds normal; no masses,  no organomegaly Pelvic: The patient's size 2 3/4 Gelhorn pessary was removed, cleaned and replaced without complications. Speculum examination revealed normal vaginal mucosa with no lesions or lacerations.  Assessment:   Encounter for pessary maintenance Cystocele Grade 3 with cervical prolapse  Plan:   The patient should return in 3 months for a pessary check and continue to use Trimo-San gel 1-2 times weekly as prescribed.    Hildred LaserAnika , MD [facility]

## 2016-10-29 ENCOUNTER — Encounter: Payer: Self-pay | Admitting: Psychology

## 2016-12-03 ENCOUNTER — Other Ambulatory Visit: Payer: Self-pay

## 2016-12-05 ENCOUNTER — Other Ambulatory Visit: Payer: Self-pay | Admitting: Oncology

## 2016-12-07 NOTE — Progress Notes (Signed)
[facility]  Telephone:([phone]) [phone] Fax:(336) [phone]  ID: [name] OB: [birth year]  MR#: [id number]  UYQ#:[id number]  Patient Care Team: Margaretann Loveless, PA-C as PCP - General (Family Medicine)  CHIEF COMPLAINT: Hereditary hemochromatosis with single gene mutation at H63D.  INTERVAL HISTORY: Patient returns to clinic today for further evaluation, repeat laboratory work, and discussion of her results. She complains of insomnia. She has tried several OTC medications and has been unsuccessful. She admits to drinking coffee in the morning and again at 5 pm with friends. She currently feels well and is asymptomatic. She does not complain of weakness or fatigue today. She has no neurologic complaints. She denies any recent fevers or illnesses. She has a good appetite and denies weight loss. She denies any chest pain or shortness of breath. She denies any nausea, vomiting, constipation, or diarrhea. She has no urinary complaints. Patient offers no specific complaints today.  REVIEW OF SYSTEMS:   Review of Systems  Constitutional: Negative.  Negative for fever, malaise/fatigue and weight loss.  Respiratory: Negative.  Negative for cough and shortness of breath.   Cardiovascular: Negative.  Negative for chest pain and leg swelling.  Gastrointestinal: Negative.  Negative for abdominal pain, blood in stool and melena.  Genitourinary: Negative.   Musculoskeletal: Negative.   Neurological: Negative.  Negative for weakness.  Psychiatric/Behavioral: Negative.  The patient is not nervous/anxious.     As per HPI. Otherwise, a complete review of systems is negative.  PAST MEDICAL HISTORY: Past Medical History:  Diagnosis Date  . Arthritis    Gout  . Colon polyp   . Diverticulosis   . GERD (gastroesophageal reflux disease)   . HOH (hard of hearing)   . Hyperglycemia   . Hyperlipidemia   . Hypertension   . Memory impairment   . Multiple contusions of trunk   .  Osteopenia     PAST SURGICAL HISTORY: Past Surgical History:  Procedure Laterality Date  . ABDOMINAL HYSTERECTOMY    . CATARACT EXTRACTION W/PHACO Left 07/22/2016   Procedure: CATARACT EXTRACTION PHACO AND INTRAOCULAR LENS PLACEMENT (IOC);  Surgeon: Galen Manila, MD;  Location: [facility] ORS;  Service: Ophthalmology;  Laterality: Left;  [place] 43.1 AP% 20.2 CDE 8.71 Fluid pack lot # [id number]  . CHOLECYSTECTOMY  04/16/2004   Dr. Orlene Erm. Anterior and posterior colporrhaphy  . COLONOSCOPY WITH PROPOFOL N/A 07/01/2016   Procedure: COLONOSCOPY WITH PROPOFOL;  Surgeon: Midge Minium, MD;  Location: [facility] ENDOSCOPY;  Service: Endoscopy;  Laterality: N/A;  . history of colon polyps  1999  . PARTIAL HYSTERECTOMY    . TONSILLECTOMY    . TUBAL LIGATION      FAMILY HISTORY Family History  Problem Relation Age of Onset  . Dementia Mother   . Prostate cancer Father   . Breast cancer Daughter 47    breast cancer       ADVANCED DIRECTIVES:    HEALTH MAINTENANCE: Social History  Substance Use Topics  . Smoking status: Never Smoker  . Smokeless tobacco: Never Used  . Alcohol use No     Colonoscopy:  PAP:  Bone density:  Lipid panel:  Allergies  Allergen Reactions  . Enalapril Other (See Comments)    Pt. Denies this allergy  . Penicillins     Has patient had a PCN reaction causing immediate rash, facial/tongue/throat swelling, SOB or lightheadedness with hypotension:unsure Has patient had a PCN reaction causing severe rash involving mucus membranes or skin necrosis:unsure Has patient had a PCN reaction that  required hospitalization:unsure Has patient had a PCN reaction occurring within the last 10 years:unsure If all of the above answers are "NO", then may proceed with Cephalosporin use.     Current Outpatient Prescriptions  Medication Sig Dispense Refill  . amLODipine (NORVASC) 10 MG tablet Take 1 tablet (10 mg total) by mouth daily. 30 tablet 5  . aspirin 81 MG tablet Take 81  mg by mouth daily.    Marland Kitchen. donepezil (ARICEPT) 5 MG tablet Take 1 tablet (5 mg total) by mouth at bedtime. 90 tablet 3  . hydrochlorothiazide (HYDRODIURIL) 12.5 MG tablet Take 1 tablet (12.5 mg total) by mouth daily. 30 tablet 3  . lovastatin (MEVACOR) 40 MG tablet Take 1 tablet (40 mg total) by mouth at bedtime. 30 tablet 3  . metoprolol succinate (TOPROL-XL) 25 MG 24 hr tablet Take 1 tablet (25 mg total) by mouth daily. 90 tablet 3   No current facility-administered medications for this visit.     OBJECTIVE: Vitals:   12/08/16 1443  BP: (!) 166/78  Pulse: (!) 56  Resp: 18  Temp: (!) 96.8 F (36 C)     Body mass index is 28.34 kg/m.    ECOG FS:0 - Asymptomatic  General: Well-developed, well-nourished, no acute distress. Eyes: Pink conjunctiva, anicteric sclera. Lungs: Clear to auscultation bilaterally. Heart: Regular rate and rhythm. No rubs, murmurs, or gallops. Abdomen: Soft, nontender, nondistended. No organomegaly noted, normoactive bowel sounds. Musculoskeletal: No edema, cyanosis, or clubbing. Neuro: Alert, answering all questions appropriately. Cranial nerves grossly intact. Skin: No rashes or petechiae noted. Psych: Normal affect.    LAB RESULTS:  Lab Results  Component Value Date   NA 137 06/10/2016   K 3.1 (L) 06/10/2016   CL 107 06/10/2016   CO2 25 06/10/2016   GLUCOSE 106 (H) 06/10/2016   BUN 21 (H) 06/10/2016   CREATININE 0.91 06/10/2016   CALCIUM 8.9 06/10/2016   PROT 7.0 12/13/2015   ALBUMIN 4.2 12/13/2015   AST 23 12/13/2015   ALT 19 12/13/2015   ALKPHOS 68 12/13/2015   BILITOT 0.4 12/13/2015   GFRNONAA 59 (L) 06/10/2016   GFRAA >60 06/10/2016    Lab Results  Component Value Date   WBC 5.8 12/08/2016   NEUTROABS 3.3 12/08/2016   HGB 16.0 12/08/2016   HCT 45.8 12/08/2016   MCV 88.1 12/08/2016   PLT 244 12/08/2016   Lab Results  Component Value Date   IRON 70 12/08/2016   TIBC 398 12/08/2016   IRONPCTSAT 18 12/08/2016   Lab Results    Component Value Date   FERRITIN 134 12/08/2016     STUDIES: No results found.  ASSESSMENT: Hereditary hemochromatosis with single gene mutation at H63D  PLAN:    1. Hereditary hemochromatosis with single gene mutation at H63D: Patient's with a single gene mutation typically do not have symptoms of iron overload. Her iron stores are within normal limits. Her hemoglobin is within normal limits. The remainder of her blood work including JAK-2 mutation are either negative or within normal limits. Patient does not require phlebotomy at this time. Return to clinic in 6 months with repeat laboratory work and one year for labs and further evaluation.  2. Insomnia: Encouraged follow-up with PCP. Encouraged her to limit her caffeine intake.  Mauro KaufmannJennifer E  , NP  Patient expressed understanding and was in agreement with this plan. She also understands that She can call clinic at any time with any questions, concerns, or complaints.    Patient was seen and  evaluated independently and I agree with the assessment and plan as dictated above. Patient's blood pressure is elevated today. Continue monitoring and treatment per primary care.  Jeralyn Ruths, MD 12/12/16 4:44 PM

## 2016-12-08 ENCOUNTER — Inpatient Hospital Stay: Payer: Medicare HMO | Attending: Oncology

## 2016-12-08 ENCOUNTER — Inpatient Hospital Stay (HOSPITAL_BASED_OUTPATIENT_CLINIC_OR_DEPARTMENT_OTHER): Payer: Medicare HMO | Admitting: Oncology

## 2016-12-08 ENCOUNTER — Inpatient Hospital Stay: Payer: Medicare HMO

## 2016-12-08 DIAGNOSIS — Z7982 Long term (current) use of aspirin: Secondary | ICD-10-CM | POA: Insufficient documentation

## 2016-12-08 DIAGNOSIS — Z79899 Other long term (current) drug therapy: Secondary | ICD-10-CM | POA: Insufficient documentation

## 2016-12-08 DIAGNOSIS — E785 Hyperlipidemia, unspecified: Secondary | ICD-10-CM | POA: Diagnosis not present

## 2016-12-08 DIAGNOSIS — G4700 Insomnia, unspecified: Secondary | ICD-10-CM

## 2016-12-08 DIAGNOSIS — K219 Gastro-esophageal reflux disease without esophagitis: Secondary | ICD-10-CM | POA: Insufficient documentation

## 2016-12-08 DIAGNOSIS — M109 Gout, unspecified: Secondary | ICD-10-CM | POA: Diagnosis not present

## 2016-12-08 DIAGNOSIS — I10 Essential (primary) hypertension: Secondary | ICD-10-CM | POA: Diagnosis not present

## 2016-12-08 DIAGNOSIS — Z86010 Personal history of colonic polyps: Secondary | ICD-10-CM | POA: Insufficient documentation

## 2016-12-08 LAB — CBC WITH DIFFERENTIAL/PLATELET
BASOS ABS: 0 10*3/uL (ref 0–0.1)
BASOS PCT: 1 %
Eosinophils Absolute: 0.1 10*3/uL (ref 0–0.7)
Eosinophils Relative: 1 %
HCT: 45.8 % (ref 35.0–47.0)
HEMOGLOBIN: 16 g/dL (ref 12.0–16.0)
Lymphocytes Relative: 34 %
Lymphs Abs: 2 10*3/uL (ref 1.0–3.6)
MCH: 30.8 pg (ref 26.0–34.0)
MCHC: 35 g/dL (ref 32.0–36.0)
MCV: 88.1 fL (ref 80.0–100.0)
Monocytes Absolute: 0.4 10*3/uL (ref 0.2–0.9)
Monocytes Relative: 8 %
NEUTROS ABS: 3.3 10*3/uL (ref 1.4–6.5)
NEUTROS PCT: 56 %
Platelets: 244 10*3/uL (ref 150–440)
RBC: 5.2 MIL/uL (ref 3.80–5.20)
RDW: 13.5 % (ref 11.5–14.5)
WBC: 5.8 10*3/uL (ref 3.6–11.0)

## 2016-12-08 LAB — IRON AND TIBC
IRON: 70 ug/dL (ref 28–170)
SATURATION RATIOS: 18 % (ref 10.4–31.8)
TIBC: 398 ug/dL (ref 250–450)
UIBC: 328 ug/dL

## 2016-12-08 LAB — FERRITIN: Ferritin: 134 ng/mL (ref 11–307)

## 2016-12-08 NOTE — Progress Notes (Signed)
Offers no complaints. States is feeling well. 

## 2016-12-18 ENCOUNTER — Ambulatory Visit (INDEPENDENT_AMBULATORY_CARE_PROVIDER_SITE_OTHER): Payer: Medicare HMO | Admitting: Psychology

## 2016-12-18 DIAGNOSIS — F0390 Unspecified dementia without behavioral disturbance: Secondary | ICD-10-CM | POA: Diagnosis not present

## 2016-12-18 NOTE — Progress Notes (Signed)
   Neuropsychology Note  Lauren NySandra L Seese returned today for 2 hours of neuropsychological testing with technician, Wallace Kellerana , BS, under the supervision of Dr. Elvis CoilMaryBeth Bailar. The patient did not appear overtly distressed by the testing session, per behavioral observation or via self-report to the technician. Rest breaks were offered. Lauren Manning will return within 2 weeks for a feedback session with Dr. Alinda DoomsBailar at which time her test performances, clinical impressions and treatment recommendations will be reviewed in detail. The patient understands she can contact our office should she require our assistance before this time.  Full report to follow.
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
## 2016-12-29 ENCOUNTER — Encounter: Payer: Self-pay | Admitting: Psychology

## 2016-12-29 ENCOUNTER — Ambulatory Visit (INDEPENDENT_AMBULATORY_CARE_PROVIDER_SITE_OTHER): Payer: Medicare HMO | Admitting: Psychology

## 2016-12-29 DIAGNOSIS — G301 Alzheimer's disease with late onset: Secondary | ICD-10-CM

## 2016-12-29 DIAGNOSIS — F0280 Dementia in other diseases classified elsewhere without behavioral disturbance: Secondary | ICD-10-CM | POA: Diagnosis not present

## 2016-12-30 ENCOUNTER — Encounter: Payer: Medicare HMO | Admitting: Psychology

## 2017-01-15 ENCOUNTER — Other Ambulatory Visit: Payer: Self-pay | Admitting: Physician Assistant

## 2017-01-15 DIAGNOSIS — I10 Essential (primary) hypertension: Secondary | ICD-10-CM

## 2017-01-15 MED ORDER — AMLODIPINE BESYLATE 10 MG PO TABS: 10.0000 mg | ORAL_TABLET | Freq: Every day | ORAL | 5 refills | Status: DC

## 2017-01-15 MED ORDER — HYDROCHLOROTHIAZIDE 12.5 MG PO TABS: 12.5000 mg | ORAL_TABLET | Freq: Every day | ORAL | 5 refills | Status: DC

## 2017-01-15 MED ORDER — METOPROLOL SUCCINATE ER 25 MG PO TB24: 25.0000 mg | ORAL_TABLET | Freq: Every day | ORAL | 3 refills | Status: DC

## 2017-01-15 NOTE — Addendum Note (Signed)
Addended by: Marjie Skiff on: 01/15/2017 03:18 PM   Modules accepted: Orders

## 2017-01-15 NOTE — Telephone Encounter (Signed)
[facility] pharmacy faxed a request on the following medications. Thanks CC   hydrochlorothiazide (HYDRODIURIL) 12.5 MG tablet   amLODipine (NORVASC) 10 MG tablet   metoprolol succinate (TOPROL-XL) 25 MG 24 hr tablet

## 2017-01-29 ENCOUNTER — Other Ambulatory Visit: Payer: Self-pay | Admitting: Physician Assistant

## 2017-01-29 DIAGNOSIS — I10 Essential (primary) hypertension: Secondary | ICD-10-CM

## 2017-01-29 MED ORDER — HYDROCHLOROTHIAZIDE 12.5 MG PO TABS: 12.5000 mg | ORAL_TABLET | Freq: Every day | ORAL | 1 refills | Status: DC

## 2017-01-29 MED ORDER — AMLODIPINE BESYLATE 10 MG PO TABS: 10.0000 mg | ORAL_TABLET | Freq: Every day | ORAL | 1 refills | Status: DC

## 2017-01-29 MED ORDER — METOPROLOL SUCCINATE ER 25 MG PO TB24: 25.0000 mg | ORAL_TABLET | Freq: Every day | ORAL | 1 refills | Status: DC

## 2017-01-29 NOTE — Telephone Encounter (Addendum)
[facility] faxed a request for a 90-days supply on the following medications.  amLODipine (NORVASC) 10 MG tablet   metoprolol succinate (TOPROL-XL) 25 MG 24 hr tablet   hydrochlorothiazide (HYDRODIURIL) 12.5 MG tablet

## 2017-03-02 ENCOUNTER — Encounter: Payer: Medicare HMO | Admitting: Obstetrics and Gynecology

## 2017-03-10 ENCOUNTER — Other Ambulatory Visit: Payer: Self-pay | Admitting: Physician Assistant

## 2017-03-10 DIAGNOSIS — E7800 Pure hypercholesterolemia, unspecified: Secondary | ICD-10-CM

## 2017-03-10 MED ORDER — LOVASTATIN 40 MG PO TABS: 40.0000 mg | ORAL_TABLET | Freq: Every day | ORAL | 5 refills | Status: DC

## 2017-03-10 NOTE — Telephone Encounter (Signed)
Last ov 09/26/16  Last filled 02/08/16 Please review. Thank you. sd

## 2017-03-10 NOTE — Telephone Encounter (Signed)
[facility] mail order faxed a refill request on the following medications:  lovastatin (MEVACOR) 40 MG tablet.  [facility] mail order/MW

## 2017-03-11 DIAGNOSIS — H43813 Vitreous degeneration, bilateral: Secondary | ICD-10-CM | POA: Diagnosis not present

## 2017-03-13 ENCOUNTER — Telehealth: Payer: Self-pay | Admitting: Physician Assistant

## 2017-03-13 DIAGNOSIS — E7800 Pure hypercholesterolemia, unspecified: Secondary | ICD-10-CM

## 2017-03-13 MED ORDER — LOVASTATIN 40 MG PO TABS
40.0000 mg | ORAL_TABLET | Freq: Every day | ORAL | 1 refills | Status: DC
Start: 2017-03-13 — End: 2018-02-08

## 2017-03-13 NOTE — Telephone Encounter (Signed)
Please Review.  Thanks,  - 

## 2017-03-13 NOTE — Telephone Encounter (Signed)
Pt has ran out of her Lovastatin 40mg .  She has been getting them through mail order but has ran short.  She wants to know if she can a rx for about 10 tablets and wants these called in locally to [facility].  Pt's call back is [phone]  Thanks, Barth Kirksteri

## 2017-03-13 NOTE — Telephone Encounter (Signed)
Verbal [facility]. Lovastatin 40mg  Qty:30 Ok per [place]

## 2017-03-13 NOTE — Telephone Encounter (Signed)
There [facility] be [facility] day [facility] that was sent on 03/10/17 and I will send 90 day supply to mail order

## 2017-03-19 ENCOUNTER — Ambulatory Visit (INDEPENDENT_AMBULATORY_CARE_PROVIDER_SITE_OTHER): Payer: Medicare HMO | Admitting: Obstetrics and Gynecology

## 2017-03-19 ENCOUNTER — Ambulatory Visit (INDEPENDENT_AMBULATORY_CARE_PROVIDER_SITE_OTHER): Payer: Medicare HMO | Admitting: Physician Assistant

## 2017-03-19 ENCOUNTER — Encounter: Payer: Self-pay | Admitting: Obstetrics and Gynecology

## 2017-03-19 ENCOUNTER — Ambulatory Visit (INDEPENDENT_AMBULATORY_CARE_PROVIDER_SITE_OTHER): Payer: Medicare HMO

## 2017-03-19 VITALS — BP 132/64 | HR 64 | Temp 98.8°F | Ht 61.0 in | Wt 150.2 lb

## 2017-03-19 VITALS — BP 138/71 | HR 53 | Ht 61.0 in | Wt 149.5 lb

## 2017-03-19 DIAGNOSIS — F0280 Dementia in other diseases classified elsewhere without behavioral disturbance: Secondary | ICD-10-CM

## 2017-03-19 DIAGNOSIS — R197 Diarrhea, unspecified: Secondary | ICD-10-CM

## 2017-03-19 DIAGNOSIS — Z4689 Encounter for fitting and adjustment of other specified devices: Secondary | ICD-10-CM

## 2017-03-19 DIAGNOSIS — Z0000 Encounter for general adult medical examination without abnormal findings: Secondary | ICD-10-CM

## 2017-03-19 DIAGNOSIS — Z1231 Encounter for screening mammogram for malignant neoplasm of breast: Secondary | ICD-10-CM

## 2017-03-19 DIAGNOSIS — G301 Alzheimer's disease with late onset: Secondary | ICD-10-CM | POA: Diagnosis not present

## 2017-03-19 DIAGNOSIS — N812 Incomplete uterovaginal prolapse: Secondary | ICD-10-CM

## 2017-03-19 DIAGNOSIS — E7800 Pure hypercholesterolemia, unspecified: Secondary | ICD-10-CM

## 2017-03-19 DIAGNOSIS — Z1239 Encounter for other screening for malignant neoplasm of breast: Secondary | ICD-10-CM

## 2017-03-19 DIAGNOSIS — I10 Essential (primary) hypertension: Secondary | ICD-10-CM | POA: Diagnosis not present

## 2017-03-19 DIAGNOSIS — Z131 Encounter for screening for diabetes mellitus: Secondary | ICD-10-CM

## 2017-03-19 MED ORDER — OXYQUINOLONE SULFATE 0.025 % VA GEL: 1.0000 | VAGINAL | 2 refills | Status: DC

## 2017-03-19 MED ORDER — DONEPEZIL HCL 10 MG PO TABS: 10.0000 mg | ORAL_TABLET | Freq: Every day | ORAL | 3 refills | Status: DC
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
## 2017-03-19 NOTE — Progress Notes (Signed)
    GYNECOLOGY PROGRESS NOTE  Subjective:    Patient ID: Lauren Manning, female    DOB: 09/23/1935, 81 y.o.   MRN: [id number]  HPI  Patient is a 81 y.o. 202P2002 female who presents for 3 month pessary check.  She reports no vaginal bleeding or discharge. She denies pelvic discomfort and difficulty urinating or moving her bowels.  The following portions of the patient's history were reviewed and updated as appropriate: allergies, current medications, past family history, past medical history, past social history, past surgical history and problem list.  Review of Systems A comprehensive review of systems was negative except for: Gastrointestinal: positive for diarrhea (notes intermittent bouts)  Objective:   Blood pressure 138/71, pulse (!) 53, height 5\' 1"  (1.549 m), weight 149 lb 8 oz (67.8 kg). General appearance: alert and no distress Abdomen: soft, non-tender; bowel sounds normal; no masses,  no organomegaly Pelvic: The patient's size 2 3/4 Gelhorn pessary was removed, cleaned and replaced without complications. Speculum examination revealed normal vaginal mucosa with no lesions or lacerations.  Assessment:   Encounter for pessary maintenance Cystocele Grade 3 with cervical prolapse Intermittent diarrhea Plan:   The patient should return in 3 months for a pessary check and continue to use Trimo-San gel 1-2 times weekly as prescribed. Advised patient to discuss her symptoms of diarrhea with her PCP or with a GI specialist.     Hildred Laserherry, , MD [facility]

## 2017-03-19 NOTE — Patient Instructions (Signed)
Health Maintenance for Postmenopausal Women Menopause is a normal process in which your reproductive ability comes to an end. This process happens gradually over a span of months to years, usually between the ages of 22 and 9. Menopause is complete when you have missed 12 consecutive menstrual periods. It is important to talk with your health care provider about some of the most common conditions that affect postmenopausal women, such as heart disease, cancer, and bone loss (osteoporosis). Adopting a healthy lifestyle and getting preventive care can help to promote your health and wellness. Those actions can also lower your chances of developing some of these common conditions. What should I know about menopause? During menopause, you may experience a number of symptoms, such as:  Moderate-to-severe hot flashes.  Night sweats.  Decrease in sex drive.  Mood swings.  Headaches.  Tiredness.  Irritability.  Memory problems.  Insomnia.  Choosing to treat or not to treat menopausal changes is an individual decision that you make with your health care provider. What should I know about hormone replacement therapy and supplements? Hormone therapy products are effective for treating symptoms that are associated with menopause, such as hot flashes and night sweats. Hormone replacement carries certain risks, especially as you become older. If you are thinking about using estrogen or estrogen with progestin treatments, discuss the benefits and risks with your health care provider. What should I know about heart disease and stroke? Heart disease, heart attack, and stroke become more likely as you age. This may be due, in part, to the hormonal changes that your body experiences during menopause. These can affect how your body processes dietary fats, triglycerides, and cholesterol. Heart attack and stroke are both medical emergencies. There are many things that you can do to help prevent heart disease  and stroke:  Have your blood pressure checked at least every 1-2 years. High blood pressure causes heart disease and increases the risk of stroke.  If you are 53-22 years old, ask your health care provider if you should take aspirin to prevent a heart attack or a stroke.  Do not use any tobacco products, including cigarettes, chewing tobacco, or electronic cigarettes. If you need help quitting, ask your health care provider.  It is important to eat a healthy diet and maintain a healthy weight. ? Be sure to include plenty of vegetables, fruits, low-fat dairy products, and lean protein. ? Avoid eating foods that are high in solid fats, added sugars, or salt (sodium).  Get regular exercise. This is one of the most important things that you can do for your health. ? Try to exercise for at least 150 minutes each week. The type of exercise that you do should increase your heart rate and make you sweat. This is known as moderate-intensity exercise. ? Try to do strengthening exercises at least twice each week. Do these in addition to the moderate-intensity exercise.  Know your numbers.Ask your health care provider to check your cholesterol and your blood glucose. Continue to have your blood tested as directed by your health care provider.  What should I know about cancer screening? There are several types of cancer. Take the following steps to reduce your risk and to catch any cancer development as early as possible. Breast Cancer  Practice breast self-awareness. ? This means understanding how your breasts normally appear and feel. ? It also means doing regular breast self-exams. Let your health care provider know about any changes, no matter how small.  If you are 40  or older, have a clinician do a breast exam (clinical breast exam or CBE) every year. Depending on your age, family history, and medical history, it may be recommended that you also have a yearly breast X-ray (mammogram).  If you  have a family history of breast cancer, talk with your health care provider about genetic screening.  If you are at high risk for breast cancer, talk with your health care provider about having an MRI and a mammogram every year.  Breast cancer (BRCA) gene test is recommended for women who have family members with BRCA-related cancers. Results of the assessment will determine the need for genetic counseling and BRCA1 and for BRCA2 testing. BRCA-related cancers include these types: ? Breast. This occurs in males or females. ? Ovarian. ? Tubal. This may also be called fallopian tube cancer. ? Cancer of the abdominal or pelvic lining (peritoneal cancer). ? Prostate. ? Pancreatic.  Cervical, Uterine, and Ovarian Cancer Your health care provider may recommend that you be screened regularly for cancer of the pelvic organs. These include your ovaries, uterus, and vagina. This screening involves a pelvic exam, which includes checking for microscopic changes to the surface of your cervix (Pap test).  For women ages 21-65, health care providers may recommend a pelvic exam and a Pap test every three years. For women ages 79-65, they may recommend the Pap test and pelvic exam, combined with testing for human papilloma virus (HPV), every five years. Some types of HPV increase your risk of cervical cancer. Testing for HPV may also be done on women of any age who have unclear Pap test results.  Other health care providers may not recommend any screening for nonpregnant women who are considered low risk for pelvic cancer and have no symptoms. Ask your health care provider if a screening pelvic exam is right for you.  If you have had past treatment for cervical cancer or a condition that could lead to cancer, you need Pap tests and screening for cancer for at least 20 years after your treatment. If Pap tests have been discontinued for you, your risk factors (such as having a new sexual partner) need to be  reassessed to determine if you should start having screenings again. Some women have medical problems that increase the chance of getting cervical cancer. In these cases, your health care provider may recommend that you have screening and Pap tests more often.  If you have a family history of uterine cancer or ovarian cancer, talk with your health care provider about genetic screening.  If you have vaginal bleeding after reaching menopause, tell your health care provider.  There are currently no reliable tests available to screen for ovarian cancer.  Lung Cancer Lung cancer screening is recommended for adults 69-62 years old who are at high risk for lung cancer because of a history of smoking. A yearly low-dose CT scan of the lungs is recommended if you:  Currently smoke.  Have a history of at least 30 pack-years of smoking and you currently smoke or have quit within the past 15 years. A pack-year is smoking an average of one pack of cigarettes per day for one year.  Yearly screening should:  Continue until it has been 15 years since you quit.  Stop if you develop a health problem that would prevent you from having lung cancer treatment.  Colorectal Cancer  This type of cancer can be detected and can often be prevented.  Routine colorectal cancer screening usually begins at  age 42 and continues through age 45.  If you have risk factors for colon cancer, your health care provider may recommend that you be screened at an earlier age.  If you have a family history of colorectal cancer, talk with your health care provider about genetic screening.  Your health care provider may also recommend using home test kits to check for hidden blood in your stool.  A small camera at the end of a tube can be used to examine your colon directly (sigmoidoscopy or colonoscopy). This is done to check for the earliest forms of colorectal cancer.  Direct examination of the colon should be repeated every  5-10 years until age 71. However, if early forms of precancerous polyps or small growths are found or if you have a family history or genetic risk for colorectal cancer, you may need to be screened more often.  Skin Cancer  Check your skin from head to toe regularly.  Monitor any moles. Be sure to tell your health care provider: ? About any new moles or changes in moles, especially if there is a change in a mole's shape or color. ? If you have a mole that is larger than the size of a pencil eraser.  If any of your family members has a history of skin cancer, especially at a young age, talk with your health care provider about genetic screening.  Always use sunscreen. Apply sunscreen liberally and repeatedly throughout the day.  Whenever you are outside, protect yourself by wearing long sleeves, pants, a wide-brimmed hat, and sunglasses.  What should I know about osteoporosis? Osteoporosis is a condition in which bone destruction happens more quickly than new bone creation. After menopause, you may be at an increased risk for osteoporosis. To help prevent osteoporosis or the bone fractures that can happen because of osteoporosis, the following is recommended:  If you are 46-71 years old, get at least 1,000 mg of calcium and at least 600 mg of vitamin D per day.  If you are older than age 55 but younger than age 65, get at least 1,200 mg of calcium and at least 600 mg of vitamin D per day.  If you are older than age 54, get at least 1,200 mg of calcium and at least 800 mg of vitamin D per day.  Smoking and excessive alcohol intake increase the risk of osteoporosis. Eat foods that are rich in calcium and vitamin D, and do weight-bearing exercises several times each week as directed by your health care provider. What should I know about how menopause affects my mental health? Depression may occur at any age, but it is more common as you become older. Common symptoms of depression  include:  Low or sad mood.  Changes in sleep patterns.  Changes in appetite or eating patterns.  Feeling an overall lack of motivation or enjoyment of activities that you previously enjoyed.  Frequent crying spells.  Talk with your health care provider if you think that you are experiencing depression. What should I know about immunizations? It is important that you get and maintain your immunizations. These include:  Tetanus, diphtheria, and pertussis (Tdap) booster vaccine.  Influenza every year before the flu season begins.  Pneumonia vaccine.  Shingles vaccine.  Your health care provider may also recommend other immunizations. This information is not intended to replace advice given to you by your health care provider. Make sure you discuss any questions you have with your health care provider. Document Released: 10/31/2005  Document Revised: 03/28/2016 Document Reviewed: 06/12/2015 Elsevier Interactive Patient Education  2018 [employer]  

## 2017-03-19 NOTE — Patient Instructions (Signed)
[name] , Thank you for taking time to come for your Medicare Wellness Visit. I appreciate your ongoing commitment to your health goals. Please review the following plan we discussed and let me know if I can assist you in the future.   Screening recommendations/referrals: Colonoscopy: completed 07/01/16 Mammogram: completed 03/27/14 Bone Density: completed 12/07/14 Recommended yearly ophthalmology/optometry visit for glaucoma screening and checkup Recommended yearly dental visit for hygiene and checkup  Vaccinations: Influenza vaccine: due 05/2017 Pneumococcal vaccine: declined Tdap vaccine: declined Shingles vaccine: declined  Advanced directives: Advance directive discussed with you today. I have provided a copy for you to complete at home and have notarized. Once this is complete please bring a copy in to our office so we can scan it into your chart.  Conditions/risks identified: Recommend to start exercising. Pt agrees to take exercise class at living facility 2 days a week for 1 hour.   Next appointment: None, need to schedule 1 year WAV.   Preventive Care 5565 Years and Older, Female Preventive care refers to lifestyle choices and visits with your health care provider that can promote health and wellness. What does preventive care include?  A yearly physical exam. This is also called an annual well check.  Dental exams once or twice a year.  Routine eye exams. Ask your health care provider how often you should have your eyes checked.  Personal lifestyle choices, including:  Daily care of your teeth and gums.  Regular physical activity.  Eating a healthy diet.  Avoiding tobacco and drug use.  Limiting alcohol use.  Practicing safe sex.  Taking low-dose aspirin every day.  Taking vitamin and mineral supplements as recommended by your health care provider. What happens during an annual well check? The services and screenings done by your health care provider  during your annual well check will depend on your age, overall health, lifestyle risk factors, and family history of disease. Counseling  Your health care provider may ask you questions about your:  Alcohol use.  Tobacco use.  Drug use.  Emotional well-being.  Home and relationship well-being.  Sexual activity.  Eating habits.  History of falls.  Memory and ability to understand (cognition).  Work and work Astronomerenvironment.  Reproductive health. Screening  You may have the following tests or measurements:  Height, weight, and BMI.  Blood pressure.  Lipid and cholesterol levels. These may be checked every 5 years, or more frequently if you are over 79 years old.  Skin check.  Lung cancer screening. You may have this screening every year starting at age 79 if you have a 30-pack-year history of smoking and currently smoke or have quit within the past 15 years.  Fecal occult blood test (FOBT) of the stool. You may have this test every year starting at age 79.  Flexible sigmoidoscopy or colonoscopy. You may have a sigmoidoscopy every 5 years or a colonoscopy every 10 years starting at age 79.  Hepatitis C blood test.  Hepatitis B blood test.  Sexually transmitted disease (STD) testing.  Diabetes screening. This is done by checking your blood sugar (glucose) after you have not eaten for a while (fasting). You may have this done every 1-3 years.  Bone density scan. This is done to screen for osteoporosis. You may have this done starting at age 79.  Mammogram. This may be done every 1-2 years. Talk to your health care provider about how often you should have regular mammograms. Talk with your health care provider about your  test results, treatment options, and if necessary, the need for more tests. Vaccines  Your health care provider may recommend certain vaccines, such as:  Influenza vaccine. This is recommended every year.  Tetanus, diphtheria, and acellular pertussis  (Tdap, Td) vaccine. You may need a Td booster every 10 years.  Zoster vaccine. You may need this after age 59.  Pneumococcal 13-valent conjugate (PCV13) vaccine. One dose is recommended after age 2.  Pneumococcal polysaccharide (PPSV23) vaccine. One dose is recommended after age 63. Talk to your health care provider about which screenings and vaccines you need and how often you need them. This information is not intended to replace advice given to you by your health care provider. Make sure you discuss any questions you have with your health care provider. Document Released: 10/05/2015 Document Revised: 05/28/2016 Document Reviewed: 07/10/2015 Elsevier Interactive Patient Education  2017 [place] Prevention in the Home Falls can cause injuries. They can happen to people of all ages. There are many things you can do to make your home safe and to help prevent falls. What can I do on the outside of my home?  Regularly fix the edges of walkways and driveways and fix any cracks.  Remove anything that might make you trip as you walk through a door, such as a raised step or threshold.  Trim any bushes or trees on the path to your home.  Use bright outdoor lighting.  Clear any walking paths of anything that might make someone trip, such as rocks or tools.  Regularly check to see if handrails are loose or broken. Make sure that both sides of any steps have handrails.  Any raised decks and porches should have guardrails on the edges.  Have any leaves, snow, or ice cleared regularly.  Use sand or salt on walking paths during winter.  Clean up any spills in your garage right away. This includes oil or grease spills. What can I do in the bathroom?  Use night lights.  Install grab bars by the toilet and in the tub and shower. Do not use towel bars as grab bars.  Use non-skid mats or decals in the tub or shower.  If you need to sit down in the shower, use a plastic, non-slip  stool.  Keep the floor dry. Clean up any water that spills on the floor as soon as it happens.  Remove soap buildup in the tub or shower regularly.  Attach bath mats securely with double-sided non-slip rug tape.  Do not have throw rugs and other things on the floor that can make you trip. What can I do in the bedroom?  Use night lights.  Make sure that you have a light by your bed that is easy to reach.  Do not use any sheets or blankets that are too big for your bed. They should not hang down onto the floor.  Have a firm chair that has side arms. You can use this for support while you get dressed.  Do not have throw rugs and other things on the floor that can make you trip. What can I do in the kitchen?  Clean up any spills right away.  Avoid walking on wet floors.  Keep items that you use a lot in easy-to-reach places.  If you need to reach something above you, use a strong step stool that has a grab bar.  Keep electrical cords out of the way.  Do not use floor polish or wax that  makes floors slippery. If you must use wax, use non-skid floor wax.  Do not have throw rugs and other things on the floor that can make you trip. What can I do with my stairs?  Do not leave any items on the stairs.  Make sure that there are handrails on both sides of the stairs and use them. Fix handrails that are broken or loose. Make sure that handrails are as long as the stairways.  Check any carpeting to make sure that it is firmly attached to the stairs. Fix any carpet that is loose or worn.  Avoid having throw rugs at the top or bottom of the stairs. If you do have throw rugs, attach them to the floor with carpet tape.  Make sure that you have a light switch at the top of the stairs and the bottom of the stairs. If you do not have them, ask someone to add them for you. What else can I do to help prevent falls?  Wear shoes that:  Do not have high heels.  Have rubber bottoms.  Are  comfortable and fit you well.  Are closed at the toe. Do not wear sandals.  If you use a stepladder:  Make sure that it is fully opened. Do not climb a closed stepladder.  Make sure that both sides of the stepladder are locked into place.  Ask someone to hold it for you, if possible.  Clearly mark and make sure that you can see:  Any grab bars or handrails.  First and last steps.  Where the edge of each step is.  Use tools that help you move around (mobility aids) if they are needed. These include:  Canes.  Walkers.  Scooters.  Crutches.  Turn on the lights when you go into a dark area. Replace any light bulbs as soon as they burn out.  Set up your furniture so you have a clear path. Avoid moving your furniture around.  If any of your floors are uneven, fix them.  If there are any pets around you, be aware of where they are.  Review your medicines with your doctor. Some medicines can make you feel dizzy. This can increase your chance of falling. Ask your doctor what other things that you can do to help prevent falls. This information is not intended to replace advice given to you by your health care provider. Make sure you discuss any questions you have with your health care provider. Document Released: 07/05/2009 Document Revised: 02/14/2016 Document Reviewed: 10/13/2014 Elsevier Interactive Patient Education  2017 [employer].

## 2017-03-19 NOTE — Progress Notes (Signed)
Subjective:   Lauren Manning is a 81 y.o. female who presents for Medicare Annual (Subsequent) preventive examination.  Review of Systems:  N/A  Cardiac Risk Factors include: advanced age (>51men, >29 women);dyslipidemia;hypertension     Objective:     Vitals: BP 132/64 (BP Location: Left Arm)   Pulse 64   Temp 98.8 F (37.1 C) (Oral)   Ht 5\' 1"  (1.549 m)   Wt 150 lb 3.2 oz (68.1 kg)   BMI 28.38 kg/m   Body mass index is 28.38 kg/m.   Tobacco History  Smoking Status  . Never Smoker  Smokeless Tobacco  . Never Used     Counseling given: Not Answered   Past Medical History:  Diagnosis Date  . Alzheimer disease   . Arthritis    Gout  . Colon polyp   . Diverticulosis   . GERD (gastroesophageal reflux disease)   . HOH (hard of hearing)   . Hyperglycemia   . Hyperlipidemia   . Hypertension   . Memory impairment   . Multiple contusions of trunk   . Osteopenia    Past Surgical History:  Procedure Laterality Date  . ABDOMINAL HYSTERECTOMY    . CATARACT EXTRACTION W/PHACO Left 07/22/2016   Procedure: CATARACT EXTRACTION PHACO AND INTRAOCULAR LENS PLACEMENT (IOC);  Surgeon: Galen Manila, MD;  Location: [facility] ORS;  Service: Ophthalmology;  Laterality: Left;  [place] 43.1 AP% 20.2 CDE 8.71 Fluid pack lot # [id number]  . CHOLECYSTECTOMY  04/16/2004   Dr. Orlene Erm. Anterior and posterior colporrhaphy  . COLONOSCOPY WITH PROPOFOL N/A 07/01/2016   Procedure: COLONOSCOPY WITH PROPOFOL;  Surgeon: Midge Minium, MD;  Location: [facility] ENDOSCOPY;  Service: Endoscopy;  Laterality: N/A;  . history of colon polyps  1999  . PARTIAL HYSTERECTOMY    . TONSILLECTOMY    . TUBAL LIGATION     Family History  Problem Relation Age of Onset  . Dementia Mother   . Prostate cancer Father   . Breast cancer Daughter 58       breast cancer   History  Sexual Activity  . Sexual activity: Not Currently  . Birth control/ protection: None    Outpatient Encounter Prescriptions as of  03/19/2017  Medication Sig  . amLODipine (NORVASC) 10 MG tablet Take 1 tablet (10 mg total) by mouth daily.  Marland Kitchen aspirin 81 MG tablet Take 81 mg by mouth daily.  Marland Kitchen donepezil (ARICEPT) 5 MG tablet Take 1 tablet (5 mg total) by mouth at bedtime.  . hydrochlorothiazide (HYDRODIURIL) 12.5 MG tablet Take 1 tablet (12.5 mg total) by mouth daily.  Marland Kitchen lovastatin (MEVACOR) 40 MG tablet Take 1 tablet (40 mg total) by mouth at bedtime.  . metoprolol succinate (TOPROL-XL) 25 MG 24 hr tablet Take 1 tablet (25 mg total) by mouth daily.  Dani Gobble SULFATE VAGINAL (TRIMO-SAN) 0.025 % GEL Place 1 Applicatorful vaginally once a week.   No facility-administered encounter medications on file as of 03/19/2017.     Activities of Daily Living In your present state of health, do you have any difficulty performing the following activities: 03/19/2017  Hearing? Y  Vision? N  Difficulty concentrating or making decisions? N  Walking or climbing stairs? N  Dressing or bathing? N  Doing errands, shopping? N  Preparing Food and eating ? N  Using the Toilet? N  In the past six months, have you accidently leaked urine? N  Do you have problems with loss of bowel control? N  Managing your Medications? N  Managing your Finances? Y  Housekeeping or managing your Housekeeping? N  Some recent data might be hidden    Patient Care Team: Margaretann LovelessBurnette, Jennifer M, PA-C as PCP - General (Family Medicine) Hildred Laserherry, Anika, MD as Referring Physician (Obstetrics and Gynecology) Koleen DistanceBailar-Heath, Marybeth, PsyD as Consulting Physician (Psychology)    Assessment:     Exercise Activities and Dietary recommendations Current Exercise Habits: The patient does not participate in regular exercise at present (goes randomly to exercise class at facility, not routine), Exercise limited by: None identified  Goals    . Exercise 2x per week           Recommend to start exercising. Pt agrees to take exercise class at living facility 2 days a  week for 1 hour.       Fall Risk Fall Risk  03/19/2017 03/10/2016  Falls in the past year? No No   Depression Screen PHQ 2/9 Scores 03/19/2017 03/19/2017 03/10/2016  PHQ - 2 Score 0 0 0  PHQ- 9 Score 2 - -     Cognitive Function     6CIT Screen 03/19/2017 08/27/2016  What Year? 0 points 0 points  What month? 0 points 0 points  What time? 0 points 3 points  Count back from 20 0 points 0 points  Months in reverse 2 points 2 points  Repeat phrase 6 points 8 points  Total Score 8 13    Immunization History  Administered Date(s) Administered  . Influenza-Unspecified 06/22/2014  . Td 12/25/2005   Screening Tests Health Maintenance  Topic Date Due  . TETANUS/TDAP  02/20/2018 (Originally 12/26/2015)  . PNA vac Low Risk Adult (1 of 2 - PCV13) 02/20/2018 (Originally 02/28/2003)  . INFLUENZA VACCINE  04/22/2017  . COLONOSCOPY  07/01/2021  . DEXA SCAN  Completed      Plan:  I have personally reviewed and addressed the Medicare Annual Wellness questionnaire and have noted the following in the patient's chart:  A. Medical and social history B. Use of alcohol, tobacco or illicit drugs  C. Current medications and supplements D. Functional ability and status E.  Nutritional status F.  Physical activity G. Advance directives H. List of other physicians I.  Hospitalizations, surgeries, and ER visits in previous 12 months J.  Vitals K. Screenings such as hearing and vision if needed, cognitive and depression L. Referrals and appointments - none  In addition, I have reviewed and discussed with patient certain preventive protocols, quality metrics, and best practice recommendations. A written personalized care plan for preventive services as well as general preventive health recommendations were provided to patient.  See attached scanned questionnaire for additional information.   Signed,  Hyacinth MeekerMckenzie , LPN Nurse Health Advisor   MD Recommendations: Pt declined tetanus and  pneumonia vaccines today. Pt to check with pharmacy to get vaccine record and update her file.

## 2017-04-23 ENCOUNTER — Encounter: Payer: Commercial Managed Care - HMO | Admitting: Obstetrics and Gynecology

## 2017-06-10 ENCOUNTER — Inpatient Hospital Stay: Payer: Medicare HMO

## 2017-06-18 ENCOUNTER — Encounter: Payer: Medicare HMO | Admitting: Obstetrics and Gynecology

## 2017-06-23 ENCOUNTER — Encounter: Payer: Medicare HMO | Admitting: Obstetrics and Gynecology

## 2017-07-02 ENCOUNTER — Encounter: Payer: Self-pay | Admitting: Obstetrics and Gynecology

## 2017-07-02 ENCOUNTER — Inpatient Hospital Stay: Payer: Medicare HMO | Attending: Oncology

## 2017-07-02 ENCOUNTER — Ambulatory Visit (INDEPENDENT_AMBULATORY_CARE_PROVIDER_SITE_OTHER): Payer: Medicare HMO | Admitting: Obstetrics and Gynecology

## 2017-07-02 VITALS — BP 163/78 | HR 61 | Ht 61.0 in | Wt 146.7 lb

## 2017-07-02 DIAGNOSIS — N812 Incomplete uterovaginal prolapse: Secondary | ICD-10-CM

## 2017-07-02 DIAGNOSIS — Z960 Presence of urogenital implants: Secondary | ICD-10-CM | POA: Insufficient documentation

## 2017-07-02 DIAGNOSIS — N8110 Cystocele, unspecified: Secondary | ICD-10-CM

## 2017-07-02 DIAGNOSIS — N814 Uterovaginal prolapse, unspecified: Secondary | ICD-10-CM

## 2017-07-02 DIAGNOSIS — I10 Essential (primary) hypertension: Secondary | ICD-10-CM

## 2017-07-02 DIAGNOSIS — Z4689 Encounter for fitting and adjustment of other specified devices: Secondary | ICD-10-CM | POA: Diagnosis not present

## 2017-07-02 LAB — CBC WITH DIFFERENTIAL/PLATELET
Basophils Absolute: 0 10*3/uL (ref 0–0.1)
Basophils Relative: 1 %
EOS ABS: 0.1 10*3/uL (ref 0–0.7)
Eosinophils Relative: 2 %
HCT: 46.3 % (ref 35.0–47.0)
HEMOGLOBIN: 15.6 g/dL (ref 12.0–16.0)
Lymphocytes Relative: 34 %
Lymphs Abs: 1.8 10*3/uL (ref 1.0–3.6)
MCH: 30.2 pg (ref 26.0–34.0)
MCHC: 33.7 g/dL (ref 32.0–36.0)
MCV: 89.5 fL (ref 80.0–100.0)
MONOS PCT: 8 %
Monocytes Absolute: 0.4 10*3/uL (ref 0.2–0.9)
NEUTROS PCT: 55 %
Neutro Abs: 2.9 10*3/uL (ref 1.4–6.5)
Platelets: 220 10*3/uL (ref 150–440)
RBC: 5.17 MIL/uL (ref 3.80–5.20)
RDW: 14 % (ref 11.5–14.5)
WBC: 5.3 10*3/uL (ref 3.6–11.0)

## 2017-07-02 LAB — IRON AND TIBC
IRON: 93 ug/dL (ref 28–170)
Saturation Ratios: 25 % (ref 10.4–31.8)
TIBC: 369 ug/dL (ref 250–450)
UIBC: 276 ug/dL

## 2017-07-02 LAB — FERRITIN: FERRITIN: 154 ng/mL (ref 11–307)

## 2017-07-02 NOTE — Progress Notes (Signed)
    GYNECOLOGY PROGRESS NOTE  Subjective:    Patient ID: Lauren Manning, female    DOB: [birth year], 81 y.o.   MRN: [id number]  HPI  Patient is a 81 y.o. G62P2002 female who presents for 3 month pessary check.  She reports no vaginal bleeding or discharge. She denies pelvic discomfort and difficulty urinating or moving her bowels.  The following portions of the patient's history were reviewed and updated as appropriate: allergies, current medications, past family history, past medical history, past social history, past surgical history and problem list.  Review of Systems Pertinent items noted in HPI and remainder of comprehensive ROS otherwise negative.   Objective:   Blood pressure (!) 163/78, pulse 61, height  (1.549 m), weight 146 lb 11.2 oz (66.5 kg). General appearance: alert and no distress Abdomen: soft, non-tender; bowel sounds normal; no masses,  no organomegaly Pelvic: The patient's size 2 3/4 Gelhorn pessary was removed, cleaned and replaced without complications. Speculum examination revealed normal vaginal mucosa with no lesions or lacerations.  Assessment:   Encounter for pessary maintenance Cystocele Grade 3 with cervical prolapse HTN, uncontrolled  Plan:   The patient should return in 3 months for a pessary check and continue to use Trimo-San gel 1-2 times weekly as prescribed. Patient notes compliance with BP meds. Advised to f/u with PCP.   Hildred Laser, MD [facility]

## 2017-09-09 ENCOUNTER — Ambulatory Visit (INDEPENDENT_AMBULATORY_CARE_PROVIDER_SITE_OTHER): Payer: Medicare HMO | Admitting: Physician Assistant

## 2017-09-09 ENCOUNTER — Encounter: Payer: Self-pay | Admitting: Physician Assistant

## 2017-09-09 VITALS — BP 182/92 | HR 63 | Temp 98.4°F | Wt 145.2 lb

## 2017-09-09 DIAGNOSIS — G301 Alzheimer's disease with late onset: Secondary | ICD-10-CM | POA: Diagnosis not present

## 2017-09-09 DIAGNOSIS — J069 Acute upper respiratory infection, unspecified: Secondary | ICD-10-CM | POA: Diagnosis not present

## 2017-09-09 DIAGNOSIS — B9789 Other viral agents as the cause of diseases classified elsewhere: Secondary | ICD-10-CM | POA: Diagnosis not present

## 2017-09-09 DIAGNOSIS — F0280 Dementia in other diseases classified elsewhere without behavioral disturbance: Secondary | ICD-10-CM

## 2017-09-09 NOTE — Progress Notes (Signed)
Patient: Lauren Manning Female    DOB: 01/16/1936   81 y.o.   MRN: [id number] Visit Date: 09/09/2017  Today's Provider: Margaretann LovelessJennifer M , PA-C   Chief Complaint  Patient presents with  . Follow-up   Subjective:    HPI   Alzheimer's, Follow Up:  Patient is here for Alzheimer's follow up.  She was seen 6 months ago for memory. Treatment for this included increased Aricept 10 mg. She reports good compliance with treatment. She reports this condition is Unchanged.  MMSE - Mini Mental State Exam 09/09/2017  Orientation to time 2  Orientation to Place 3  Registration 2  Attention/ Calculation 5  Recall 1  Language- name 2 objects 2  Language- repeat 1  Language- follow 3 step command 3  Language- read & follow direction 1  Write a sentence 1  Copy design 1  Total score 22   ------------------------------------------------------------------------------------ Patient states she has a cold that started at the beginning of the week. She has been taking OTC cough suppressant and cough drops for symptoms. She does report symptoms are improving.       6CIT Screen 03/19/2017 08/27/2016  What Year? 0 points 0 points  What month? 0 points 0 points  What time? 0 points 3 points  Count back from 20 0 points 0 points  Months in reverse 2 points 2 points  Repeat phrase 6 points 8 points  Total Score 8 13      Allergies  Allergen Reactions  . Enalapril Other (See Comments)    Pt. Denies this allergy Other reaction(s): Unknown  . Penicillins     Has patient had a PCN reaction causing immediate rash, facial/tongue/throat swelling, SOB or lightheadedness with hypotension:unsure Has patient had a PCN reaction causing severe rash involving mucus membranes or skin necrosis:unsure Has patient had a PCN reaction that required hospitalization:unsure Has patient had a PCN reaction occurring within the last 10 years:unsure If all of the above answers are "NO", then may proceed with  Cephalosporin use.  Other reaction(s): Unknown     Current Outpatient Medications:  .  amLODipine (NORVASC) 10 MG tablet, Take 1 tablet (10 mg total) by mouth daily., Disp: 90 tablet, Rfl: 1 .  aspirin 81 MG tablet, Take 81 mg by mouth daily., Disp: , Rfl:  .  donepezil (ARICEPT) 10 MG tablet, Take 1 tablet (10 mg total) by mouth at bedtime., Disp: 90 tablet, Rfl: 3 .  hydrochlorothiazide (HYDRODIURIL) 12.5 MG tablet, Take 1 tablet (12.5 mg total) by mouth daily., Disp: 90 tablet, Rfl: 1 .  lovastatin (MEVACOR) 40 MG tablet, Take 1 tablet (40 mg total) by mouth at bedtime., Disp: 90 tablet, Rfl: 1 .  metoprolol succinate (TOPROL-XL) 25 MG 24 hr tablet, Take 1 tablet (25 mg total) by mouth daily., Disp: 90 tablet, Rfl: 1 .  OXYQUINOLONE SULFATE VAGINAL (TRIMO-SAN) 0.025 % GEL, Place 1 Applicatorful vaginally once a week., Disp: 1 Tube, Rfl: 2  Review of Systems  Constitutional: Negative.   HENT: Positive for congestion and postnasal drip. Negative for sinus pressure, sinus pain, sneezing, sore throat and trouble swallowing.   Respiratory: Positive for cough (improving).   Cardiovascular: Negative.   Neurological:       Alzheimer's disease  Psychiatric/Behavioral: Negative.     Social History   Tobacco Use  . Smoking status: Never Smoker  . Smokeless tobacco: Never Used  Substance Use Topics  . Alcohol use: No   Objective:   BP Marland Kitchen(!)  182/92 (BP Location: Left Arm, Patient Position: Sitting, Cuff Size: Normal)   Pulse 63   Temp 98.4 F (36.9 C) (Oral)   Wt 145 lb 3.2 oz (65.9 kg)   SpO2 95%   BMI 27.44 kg/m    Physical Exam  Constitutional: She appears well-developed and well-nourished. No distress.  HENT:  Head: Normocephalic and atraumatic.  Right Ear: Hearing, tympanic membrane, external ear and ear canal normal.  Left Ear: Hearing, tympanic membrane, external ear and ear canal normal.  Nose: Nose normal.  Mouth/Throat: Uvula is midline, oropharynx is clear and moist  and mucous membranes are normal. No oropharyngeal exudate.  Eyes: Conjunctivae are normal. Pupils are equal, round, and reactive to light. Right eye exhibits no discharge. Left eye exhibits no discharge. No scleral icterus.  Neck: Normal range of motion. Neck supple. No tracheal deviation present. No thyromegaly present.  Cardiovascular: Normal rate, regular rhythm and normal heart sounds. Exam reveals no gallop and no friction rub.  No murmur heard. Pulmonary/Chest: Effort normal and breath sounds normal. No stridor. No respiratory distress. She has no wheezes. She has no rales.  Lymphadenopathy:    She has no cervical adenopathy.  Skin: Skin is warm and dry. She is not diaphoretic.  Psychiatric: She has a normal mood and affect. Her speech is normal and behavior is normal. Judgment and thought content normal. Cognition and memory are impaired.  Vitals reviewed.      Assessment & Plan:     1. Late onset Alzheimer's disease without behavioral disturbance Stable on Donepezil 10mg  at this time and unchanged scores from 02/2017. I will see her back in 6 months for her CPE. Her daughter is to call if any changes noted earlier.   2. Viral URI with cough Improving. Continue OTC treatment. Advised of warning signs of second sickening and for her to call if these occur.        Margaretann LovelessJennifer M , PA-C  [facility]

## 2017-09-10 ENCOUNTER — Encounter: Payer: Self-pay | Admitting: Physician Assistant

## 2017-10-01 ENCOUNTER — Telehealth: Payer: Self-pay

## 2017-10-01 ENCOUNTER — Encounter: Payer: Self-pay | Admitting: Physician Assistant

## 2017-10-01 NOTE — Telephone Encounter (Signed)
LM for Lauren KelpCynthia Manning that the letter is placed up front ready for pick up.  Thanks,  -

## 2017-10-01 NOTE — Telephone Encounter (Signed)
Aram BeechamCynthia (Pt's daughter) called and would like a note stating that Dois DavenportSandra would benefit having a dog, cat, or bird as a companion.    Please call when complete.   Thanks,   -Vernona RiegerLaura

## 2017-10-01 NOTE — Telephone Encounter (Signed)
Please review

## 2017-10-01 NOTE — Telephone Encounter (Signed)
Letter printed.

## 2017-10-15 ENCOUNTER — Ambulatory Visit: Payer: Medicare HMO | Admitting: Obstetrics and Gynecology

## 2017-10-15 ENCOUNTER — Encounter: Payer: Self-pay | Admitting: Obstetrics and Gynecology

## 2017-10-15 VITALS — BP 143/73 | HR 54 | Ht 61.0 in | Wt 144.2 lb

## 2017-10-15 DIAGNOSIS — R197 Diarrhea, unspecified: Secondary | ICD-10-CM | POA: Diagnosis not present

## 2017-10-15 DIAGNOSIS — Z4689 Encounter for fitting and adjustment of other specified devices: Secondary | ICD-10-CM | POA: Diagnosis not present

## 2017-10-15 DIAGNOSIS — N8110 Cystocele, unspecified: Secondary | ICD-10-CM | POA: Diagnosis not present

## 2017-10-15 NOTE — Progress Notes (Signed)
    GYNECOLOGY PROGRESS NOTE  Subjective:    Patient ID: Lauren Manning, female    DOB: 02/18/1936, 82 y.o.   MRN: [id number]  HPI  Patient is a 82 y.o. [name] female who presents for 3 month pessary check.  She reports no vaginal bleeding or discharge. She denies pelvic discomfort and difficulty urinating or moving her bowels.  The following portions of the patient's history were reviewed and updated as appropriate: allergies, current medications, past family history, past medical history, past social history, past surgical history and problem list.  Review of Systems A comprehensive review of systems was negative except for: Gastrointestinal: positive for diarrhea (notes this occurs, usually in the morning) for 1-2 days out of each month, usually lasting a few hours.  Takes Pepto-bismol.   Objective:   Blood pressure (!) 143/73, pulse (!) 54, height 5\' 1"  (1.549 m), weight 144 lb 4 oz (65.4 kg). General appearance: alert and no distress Abdomen: soft, non-tender; bowel sounds normal; no masses,  no organomegaly Pelvic: The patient's size 2 3/4 Gelhorn pessary was removed, cleaned and replaced without complications. Speculum examination revealed normal vaginal mucosa with no lesions or lacerations.  Assessment:   Encounter for pessary maintenance Cystocele Grade 3 with cervical prolapse Diarrhea  Plan:   - The patient should return in 3-4 months for a pessary check and continue to use Trimo-San gel once weekly. - Diarrhea of unknown source. Occurs monthly. Currently taking Pepto-Bismol.  If sometimes are bothersome, advised on discussing further with PCP.     Lauren Manning, , MD [facility]

## 2017-10-15 NOTE — Progress Notes (Signed)
Pt is here for a pessary check. Also c/o diarrhea.Pt is unsure of daily meds.

## 2017-11-20 ENCOUNTER — Other Ambulatory Visit: Payer: Self-pay | Admitting: Physician Assistant

## 2017-11-20 DIAGNOSIS — I10 Essential (primary) hypertension: Secondary | ICD-10-CM

## 2017-11-20 MED ORDER — HYDROCHLOROTHIAZIDE 12.5 MG PO TABS: 12.5000 mg | ORAL_TABLET | Freq: Every day | ORAL | 11 refills | Status: DC

## 2017-11-20 NOTE — Telephone Encounter (Signed)
[facility] faxed refill request for the following medications:  hydrochlorothiazide (HYDRODIURIL) 12.5 MG tablet   30 day supply  Last Rx: 01/29/17 LOV: 09/09/17 Please advise. Thanks TNP

## 2017-11-23 ENCOUNTER — Emergency Department: Payer: Medicare HMO

## 2017-11-23 ENCOUNTER — Emergency Department
Admission: EM | Admit: 2017-11-23 | Discharge: 2017-11-23 | Disposition: A | Discharge status: Home / Self Care | Payer: Medicare HMO | Attending: Emergency Medicine | Admitting: Emergency Medicine

## 2017-11-23 DIAGNOSIS — F0280 Dementia in other diseases classified elsewhere without behavioral disturbance: Secondary | ICD-10-CM | POA: Diagnosis not present

## 2017-11-23 DIAGNOSIS — R531 Weakness: Secondary | ICD-10-CM | POA: Diagnosis present

## 2017-11-23 DIAGNOSIS — I10 Essential (primary) hypertension: Secondary | ICD-10-CM | POA: Insufficient documentation

## 2017-11-23 DIAGNOSIS — G309 Alzheimer's disease, unspecified: Secondary | ICD-10-CM | POA: Insufficient documentation

## 2017-11-23 DIAGNOSIS — Z7982 Long term (current) use of aspirin: Secondary | ICD-10-CM | POA: Insufficient documentation

## 2017-11-23 DIAGNOSIS — Z79899 Other long term (current) drug therapy: Secondary | ICD-10-CM | POA: Insufficient documentation

## 2017-11-23 DIAGNOSIS — R4781 Slurred speech: Secondary | ICD-10-CM | POA: Diagnosis not present

## 2017-11-23 DIAGNOSIS — R6884 Jaw pain: Secondary | ICD-10-CM | POA: Insufficient documentation

## 2017-11-23 DIAGNOSIS — R42 Dizziness and giddiness: Secondary | ICD-10-CM | POA: Insufficient documentation

## 2017-11-23 LAB — COMPREHENSIVE METABOLIC PANEL
ALK PHOS: 62 U/L (ref 38–126)
ALT: 22 U/L (ref 14–54)
AST: 28 U/L (ref 15–41)
Albumin: 4.3 g/dL (ref 3.5–5.0)
Anion gap: 10 (ref 5–15)
BUN: 20 mg/dL (ref 6–20)
CALCIUM: 9.3 mg/dL (ref 8.9–10.3)
CO2: 25 mmol/L (ref 22–32)
CREATININE: 1.04 mg/dL — AB (ref 0.44–1.00)
Chloride: 102 mmol/L (ref 101–111)
GFR, EST AFRICAN AMERICAN: 58 mL/min — AB (ref >60)
GFR, EST NON AFRICAN AMERICAN: 50 mL/min — AB (ref >60)
Glucose, Bld: 107 mg/dL — ABNORMAL HIGH (ref 65–99)
Potassium: 3 mmol/L — ABNORMAL LOW (ref 3.5–5.1)
Sodium: 137 mmol/L (ref 135–145)
Total Bilirubin: 0.8 mg/dL (ref 0.3–1.2)
Total Protein: 7.7 g/dL (ref 6.5–8.1)

## 2017-11-23 LAB — PROTIME-INR
INR: 0.94
Prothrombin Time: 12.5 seconds (ref 11.4–15.2)

## 2017-11-23 LAB — DIFFERENTIAL
Basophils Absolute: 0.1 10*3/uL (ref 0–0.1)
Basophils Relative: 1 %
Eosinophils Absolute: 0.1 10*3/uL (ref 0–0.7)
Eosinophils Relative: 1 %
LYMPHS PCT: 24 %
Lymphs Abs: 1.7 10*3/uL (ref 1.0–3.6)
MONO ABS: 0.6 10*3/uL (ref 0.2–0.9)
MONOS PCT: 8 %
NEUTROS ABS: 4.7 10*3/uL (ref 1.4–6.5)
Neutrophils Relative %: 66 %

## 2017-11-23 LAB — APTT: aPTT: 28 seconds (ref 24–36)

## 2017-11-23 LAB — CBC
HEMATOCRIT: 49.9 % — AB (ref 35.0–47.0)
HEMOGLOBIN: 16.6 g/dL — AB (ref 12.0–16.0)
MCH: 30 pg (ref 26.0–34.0)
MCHC: 33.3 g/dL (ref 32.0–36.0)
MCV: 90 fL (ref 80.0–100.0)
Platelets: 229 10*3/uL (ref 150–440)
RBC: 5.54 MIL/uL — AB (ref 3.80–5.20)
RDW: 13.7 % (ref 11.5–14.5)
WBC: 7.2 10*3/uL (ref 3.6–11.0)

## 2017-11-23 LAB — TROPONIN I

## 2017-11-23 NOTE — ED Notes (Signed)

## 2017-11-23 NOTE — ED Triage Notes (Signed)
First Nurse Note:  [facility] with c/o slurred speech and right ear and jaw pain since waking up this morning at 0900.  Patient states she went to bed last night at around 2200.  Patient is AAOx3.  Skin warm and dry.  Equal facial movements noted. Speech clear.  NAD

## 2017-11-23 NOTE — ED Provider Notes (Signed)
[facility] Emergency Department Provider Note   ____________________________________________   I have reviewed the triage vital signs and the nursing notes.   HISTORY  Chief Complaint Weakness   History limited by: Not Limited   HPI Lauren Manning is a 82 y.o. female who presents to the emergency department today because of concerns for dizziness and lightheadedness and feeling not like herself.  Today states that the symptoms started this morning upon awakening.  She felt fine yesterday and last evening when she went to bed.  She does have a hard time exactly explaining the way she fell.  In addition to the lightheadedness she is also had some right jaw pain that also started this morning.  She talked to her daughter on the phone earlier today the daughter states that her words might of been slurred but she thinks it is just because her mother was upset.  She no longer is having the symptoms.  She denies any recent illnesses or fevers.   Per medical record review patient has a history of HLD, HTN.   Past Medical History:  Diagnosis Date  . Alzheimer disease   . Arthritis    Gout  . Colon polyp   . Diverticulosis   . GERD (gastroesophageal reflux disease)   . HOH (hard of hearing)   . Hyperglycemia   . Hyperlipidemia   . Hypertension   . Memory impairment   . Multiple contusions of trunk   . Osteopenia     Patient Active Problem List   Diagnosis Date Noted  . Vaginal pessary in situ 07/02/2017  . Hx of colonic polyps   . Hereditary hemochromatosis (HCC) 06/09/2016  . Vaginal atrophy 05/19/2016  . Baden-Walker grade 3 cystocele 05/19/2016  . Allergic reaction 11/21/2015  . Cataract 11/21/2015  . Cardiovascular degeneration (with mention of arteriosclerosis) 11/21/2015  . DD (diverticular disease) 11/21/2015  . Essential (primary) hypertension 11/21/2015  . Alzheimer's dementia 11/21/2015  . Acid reflux 11/21/2015  . Arthritis urica  11/21/2015  . Calcium blood increased 11/21/2015  . Hypercholesteremia 11/21/2015  . Blood glucose elevated 11/21/2015  . Cannot sleep 11/21/2015  . LBP (low back pain) 11/21/2015  . Bad memory 11/21/2015  . Contusion of multiple sites of trunk 11/21/2015  . Osteopenia 11/21/2015  . Blood in feces 11/21/2015  . Cervical prolapse 11/21/2015  . Neck pain 12/12/2014  . Convulsion (HCC) 12/12/2014  . Hematuria 12/12/2014  . Limb weakness 12/20/2008    Past Surgical History:  Procedure Laterality Date  . ABDOMINAL HYSTERECTOMY    . CATARACT EXTRACTION W/PHACO Left 07/22/2016   Procedure: CATARACT EXTRACTION PHACO AND INTRAOCULAR LENS PLACEMENT (IOC);  Surgeon: Galen Manila, MD;  Location: [facility] ORS;  Service: Ophthalmology;  Laterality: Left;  [place] 43.1 AP% 20.2 CDE 8.71 Fluid pack lot # [id number]  . CHOLECYSTECTOMY  04/16/2004   Dr. Orlene Erm. Anterior and posterior colporrhaphy  . COLONOSCOPY WITH PROPOFOL N/A 07/01/2016   Procedure: COLONOSCOPY WITH PROPOFOL;  Surgeon: Midge Minium, MD;  Location: [facility] ENDOSCOPY;  Service: Endoscopy;  Laterality: N/A;  . history of colon polyps  1999  . PARTIAL HYSTERECTOMY    . TONSILLECTOMY    . TUBAL LIGATION      Prior to Admission medications   Medication Sig Start Date End Date Taking? Authorizing Provider  amLODipine (NORVASC) 10 MG tablet Take 1 tablet (10 mg total) by mouth daily. 01/29/17   Margaretann Loveless, PA-C  aspirin 81 MG tablet Take 81 mg by mouth daily.  [provider]  donepezil (ARICEPT) 10 MG tablet Take 1 tablet (10 mg total) by mouth at bedtime. 03/19/17   Margaretann Loveless, PA-C  hydrochlorothiazide (HYDRODIURIL) 12.5 MG tablet Take 1 tablet (12.5 mg total) by mouth daily. 11/20/17   Margaretann Loveless, PA-C  lovastatin (MEVACOR) 40 MG tablet Take 1 tablet (40 mg total) by mouth at bedtime. 03/13/17   Margaretann Loveless, PA-C  metoprolol succinate (TOPROL-XL) 25 MG 24 hr tablet Take 1 tablet (25 mg total)  by mouth daily. 01/29/17   Margaretann Loveless, PA-C  OXYQUINOLONE SULFATE VAGINAL (TRIMO-SAN) 0.025 % GEL Place 1 Applicatorful vaginally once a week. Patient not taking: Reported on 10/15/2017 03/19/17   Hildred Laser, MD    Allergies Enalapril and Penicillins  Family History  Problem Relation Age of Onset  . Dementia Mother   . Prostate cancer Father   . Breast cancer Daughter 80       breast cancer    Social History Social History   Tobacco Use  . Smoking status: Never Smoker  . Smokeless tobacco: Never Used  Substance Use Topics  . Alcohol use: No  . Drug use: No    Review of Systems Constitutional: No fever/chills Eyes: No visual changes. ENT: No sore throat. Cardiovascular: Denies chest pain. Respiratory: Denies shortness of breath. Gastrointestinal: No abdominal pain.  No nausea, no vomiting.  No diarrhea.   Genitourinary: Negative for dysuria. Musculoskeletal: Negative for back pain. Skin: Negative for rash. Neurological: Positive for lightheadedness. ____________________________________________   PHYSICAL EXAM:  VITAL SIGNS: ED Triage Vitals  Enc Vitals Group     BP 11/23/17 1909 (!) 148/63     Pulse Rate 11/23/17 1909 60     Resp 11/23/17 1909 17     Temp 11/23/17 1909 (!) 97.5 F (36.4 C)     Temp Source 11/23/17 1909 Oral     SpO2 11/23/17 1909 97 %     Weight 11/23/17 1910 146 lb (66.2 kg)     Height 11/23/17 1910 5\' 2"  (1.575 m)     Head Circumference --      Peak Flow --      Pain Score 11/23/17 1910 5   Constitutional: Alert and oriented. Well appearing and in no distress. Eyes: Conjunctivae are normal.  ENT   Head: Normocephalic and atraumatic.   Nose: No congestion/rhinnorhea.   Mouth/Throat: Mucous membranes are moist.   Neck: No stridor. Hematological/Lymphatic/Immunilogical: No cervical lymphadenopathy. Cardiovascular: Normal rate, regular rhythm.  No murmurs, rubs, or gallops. Respiratory: Normal respiratory effort  without tachypnea nor retractions. Breath sounds are clear and equal bilaterally. No wheezes/rales/rhonchi. Gastrointestinal: Soft and non tender. No rebound. No guarding.  Genitourinary: Deferred Musculoskeletal: Normal range of motion in all extremities. No lower extremity edema. Neurologic:  Normal speech and language. No gross focal neurologic deficits are appreciated.  Skin:  Skin is warm, dry and intact. No rash noted. Psychiatric: Mood and affect are normal. Speech and behavior are normal. Patient exhibits appropriate insight and judgment.  ____________________________________________    LABS (pertinent positives/negatives)  Trop <0.03 INR 0.94 CBC wbc 7.2, hgb 16.6, plt 229 CMP k 3.0, cr 1.04  ____________________________________________   EKG  I, Phineas Semen, attending physician, personally viewed and interpreted this EKG  EKG Time: 1903 Rate: 62 Rhythm: normal sinus rhythm Axis: left axis deviation Intervals: qtc 470 QRS: RBBB, LAFB ST changes: no st elevation Impression: abnormal ekg  ____________________________________________    RADIOLOGY  CT head No acute findings  ____________________________________________   PROCEDURES  Procedures  ____________________________________________   INITIAL IMPRESSION / ASSESSMENT AND PLAN / ED COURSE  Pertinent labs & imaging results that were available during my care of the patient were reviewed by me and considered in my medical decision making (see chart for details).  Patient presents to the emergency department today because of concerns for feeling unwell this morning.  Somewhat vague symptoms.  Workup here however without any concerning findings.  At this point I doubt stroke.  I even doubt TIA given lack of other concerning symptoms.  Did however discuss this possibility with patient.  Discussed importance of primary care follow-up we did discuss return  precautions.   ____________________________________________   FINAL CLINICAL IMPRESSION(S) / ED DIAGNOSES  Final diagnoses:  Lightheadedness  Jaw pain     Note: This dictation was prepared with Dragon dictation. Any transcriptional errors that result from this process are unintentional     Phineas SemenGoodman, , MD 11/23/17 2333

## 2017-11-23 NOTE — Discharge Instructions (Signed)
Please seek medical attention for any high fevers, chest pain, shortness of breath, change in behavior, persistent vomiting, bloody stool or any other new or concerning symptoms.  

## 2017-11-23 NOTE — ED Triage Notes (Signed)
Patient reports that she awoke this morning with slurred speech, weakness, right jaw pain, right ear pain, and general malaise. Patient called her daughter at [phone] at continued to have slurred speech per daughter. Patient reports weakness, slurred speech, and malaise have since resolved. Patient reports last normal to be 2200 yesterday.

## 2017-11-29 NOTE — Progress Notes (Signed)
[facility]  [phone] [phone]  ID: Kevan NySandra L Resurreccion OB: 09/17/1936  MR#: [id number]  AOZ#:[id number]#:[id number]  Patient Care Team: Reine JustBurnette, Jennifer M, PA-C as PCP - General (Family Medicine) Hildred Laserherry, Anika, MD as Referring Physician (Obstetrics and Gynecology) Koleen DistanceBailar-Heath, Marybeth, PsyD as Consulting Physician (Psychology)  CHIEF COMPLAINT: Hereditary hemochromatosis with single gene mutation at H63D.  INTERVAL HISTORY: Patient was last evaluated in September 2017.  She returns to clinic today for further evaluation and repeat laboratory work. She currently feels well and is asymptomatic. She does not complain of weakness or fatigue today. She has no neurologic complaints. She denies any recent fevers or illnesses. She has a good appetite and denies weight loss. She denies any chest pain or shortness of breath. She denies any nausea, vomiting, constipation, or diarrhea. She has no urinary complaints. Patient offers no specific complaints today.  REVIEW OF SYSTEMS:   Review of Systems  Constitutional: Negative.  Negative for fever, malaise/fatigue and weight loss.  Respiratory: Negative.  Negative for cough and shortness of breath.   Cardiovascular: Negative.  Negative for chest pain and leg swelling.  Gastrointestinal: Negative.  Negative for abdominal pain, blood in stool and melena.  Genitourinary: Negative.  Negative for dysuria.  Musculoskeletal: Negative.   Skin: Negative.  Negative for rash.  Neurological: Negative.  Negative for weakness.  Psychiatric/Behavioral: Negative.  The patient is not nervous/anxious.     As per HPI. Otherwise, a complete review of systems is negative.  PAST MEDICAL HISTORY: Past Medical History:  Diagnosis Date  . Alzheimer disease   . Arthritis    Gout  . Colon polyp   . Diverticulosis   . GERD (gastroesophageal reflux disease)   . HOH (hard of hearing)   . Hyperglycemia   . Hyperlipidemia   . Hypertension    . Memory impairment   . Multiple contusions of trunk   . Osteopenia     PAST SURGICAL HISTORY: Past Surgical History:  Procedure Laterality Date  . ABDOMINAL HYSTERECTOMY    . CATARACT EXTRACTION W/PHACO Left 07/22/2016   Procedure: CATARACT EXTRACTION PHACO AND INTRAOCULAR LENS PLACEMENT (IOC);  Surgeon: Galen ManilaWilliam Porfilio, MD;  Location: [facility] ORS;  Service: Ophthalmology;  Laterality: Left;  US 43.1 AP% 20.2 CDE 8.71 Fluid pack lot # [id number]  . CHOLECYSTECTOMY  04/16/2004   Dr. Orlene ErmKincuss. Anterior and posterior colporrhaphy  . COLONOSCOPY WITH PROPOFOL N/A 07/01/2016   Procedure: COLONOSCOPY WITH PROPOFOL;  Surgeon: Midge Miniumarren Wohl, MD;  Location: [facility] ENDOSCOPY;  Service: Endoscopy;  Laterality: N/A;  . history of colon polyps  1999  . PARTIAL HYSTERECTOMY    . TONSILLECTOMY    . TUBAL LIGATION      FAMILY HISTORY Family History  Problem Relation Age of Onset  . Dementia Mother   . Prostate cancer Father   . Breast cancer Daughter [id number]       breast cancer       ADVANCED DIRECTIVES:    HEALTH MAINTENANCE: Social History   Tobacco Use  . Smoking status: Never Smoker  . Smokeless tobacco: Never Used  Substance Use Topics  . Alcohol use: No  . Drug use: No     Colonoscopy:  PAP:  Bone density:  Lipid panel:  Allergies  Allergen Reactions  . Enalapril Other (See Comments)    Pt. Denies this allergy Other reaction(s): Unknown  . Penicillins     Has patient had a PCN reaction causing immediate rash, facial/tongue/throat swelling, SOB or lightheadedness with hypotension:unsure Has  patient had a PCN reaction causing severe rash involving mucus membranes or skin necrosis:unsure Has patient had a PCN reaction that required hospitalization:unsure Has patient had a PCN reaction occurring within the last 10 years:unsure If all of the above answers are "NO", then may proceed with Cephalosporin use.  Other reaction(s): Unknown    Current Outpatient Medications    Medication Sig Dispense Refill  . amLODipine (NORVASC) 10 MG tablet Take 1 tablet (10 mg total) by mouth daily. 90 tablet 1  . aspirin 81 MG tablet Take 81 mg by mouth daily.    Marland Kitchen donepezil (ARICEPT) 10 MG tablet Take 1 tablet (10 mg total) by mouth at bedtime. 90 tablet 3  . hydrochlorothiazide (HYDRODIURIL) 12.5 MG tablet Take 1 tablet (12.5 mg total) by mouth daily. 30 tablet 11  . lovastatin (MEVACOR) 40 MG tablet Take 1 tablet (40 mg total) by mouth at bedtime. 90 tablet 1  . metoprolol succinate (TOPROL-XL) 25 MG 24 hr tablet Take 1 tablet (25 mg total) by mouth daily. 90 tablet 1  . OXYQUINOLONE SULFATE VAGINAL (TRIMO-SAN) 0.025 % GEL Place 1 Applicatorful vaginally once a week. (Patient not taking: Reported on 10/15/2017) 1 Tube 2   No current facility-administered medications for this visit.     OBJECTIVE: Vitals:   12/04/17 1349  BP: 132/71  Pulse: (!) 54  Resp: 18  Temp: 97.7 F (36.5 C)     Body mass index is 25.97 kg/m.    ECOG FS:0 - Asymptomatic  General: Well-developed, well-nourished, no acute distress. Eyes: Pink conjunctiva, anicteric sclera. Lungs: Clear to auscultation bilaterally. Heart: Regular rate and rhythm. No rubs, murmurs, or gallops. Abdomen: Soft, nontender, nondistended. No organomegaly noted, normoactive bowel sounds. Musculoskeletal: No edema, cyanosis, or clubbing. Neuro: Alert, answering all questions appropriately. Cranial nerves grossly intact. Skin: No rashes or petechiae noted. Psych: Normal affect.   LAB RESULTS:  Lab Results  Component Value Date   NA 137 11/23/2017   K 3.0 (L) 11/23/2017   CL 102 11/23/2017   CO2 25 11/23/2017   GLUCOSE 107 (H) 11/23/2017   BUN 20 11/23/2017   CREATININE 1.04 (H) 11/23/2017   CALCIUM 9.3 11/23/2017   PROT 7.7 11/23/2017   ALBUMIN 4.3 11/23/2017   AST 28 11/23/2017   ALT 22 11/23/2017   ALKPHOS 62 11/23/2017   BILITOT 0.8 11/23/2017   GFRNONAA 50 (L) 11/23/2017   GFRAA 58 (L) 11/23/2017     Lab Results  Component Value Date   WBC 7.9 12/04/2017   NEUTROABS 5.3 12/04/2017   HGB 15.7 12/04/2017   HCT 45.3 12/04/2017   MCV 88.8 12/04/2017   PLT 242 12/04/2017   Lab Results  Component Value Date   IRON 93 07/02/2017   TIBC 369 07/02/2017   IRONPCTSAT 25 07/02/2017   Lab Results  Component Value Date   FERRITIN 154 07/02/2017     STUDIES: Ct Head Wo Contrast  Result Date: 11/23/2017 CLINICAL DATA:  Slurred speech and weakness with right jaw pain and ear pain since this morning. EXAM: CT HEAD WITHOUT CONTRAST TECHNIQUE: Contiguous axial images were obtained from the base of the skull through the vertex without intravenous contrast. COMPARISON:  05/30/2013 FINDINGS: Brain: Redemonstration of a moderate degree of periventricular and subcortical white matter hypoattenuation compatible with chronic microvascular ischemia. Mild atrophy with ventricular sulcal prominence. No acute intracranial hemorrhage, midline shift or edema. No intra-axial mass nor extra-axial fluid collections. Midline fourth ventricle and basal cisterns. Vascular: No hyperdense vessels. Skull: Negative for  fracture or focal lesion. Sinuses/Orbits: Bilateral cataract extractions. Intact orbits and globes. Clear paranasal sinuses and mastoids. Other: None IMPRESSION: Stable atrophy with chronic moderate small vessel ischemia. No acute intracranial abnormality. Electronically Signed   By: Tollie Eth M.D.   On: 11/23/2017 19:46    ASSESSMENT: Hereditary hemochromatosis with single gene mutation at H63D  PLAN:    1. Hereditary hemochromatosis with single gene mutation at H63D: Patient's with a single gene mutation typically do not have symptoms of iron overload. Her hemoglobin and iron stores continue to be within normal limits.  Previously, the remainder of her blood work including JAK-2 mutation are either negative or within normal limits. Patient does not require phlebotomy at this time.  After lengthy  discussion with the patient and her daughter, it was agreed upon that no further follow-up is necessary.  Please refer her back if there are any questions or concerns.   2. Hypertension: Blood pressure is within normal limits today.  Continue monitoring and treatment per primary care.  Approximately 20 minutes spent in discussion of which greater than 50% was consultation.  Patient expressed understanding and was in agreement with this plan. She also understands that She can call clinic at any time with any questions, concerns, or complaints.    Jeralyn Ruths, MD   12/04/2017 2:39 PM

## 2017-12-04 ENCOUNTER — Encounter: Payer: Self-pay | Admitting: Oncology

## 2017-12-04 ENCOUNTER — Inpatient Hospital Stay (HOSPITAL_BASED_OUTPATIENT_CLINIC_OR_DEPARTMENT_OTHER): Payer: Medicare HMO | Admitting: Oncology

## 2017-12-04 ENCOUNTER — Inpatient Hospital Stay: Payer: Medicare HMO | Attending: Hematology and Oncology

## 2017-12-04 DIAGNOSIS — Z79899 Other long term (current) drug therapy: Secondary | ICD-10-CM | POA: Insufficient documentation

## 2017-12-04 DIAGNOSIS — E785 Hyperlipidemia, unspecified: Secondary | ICD-10-CM | POA: Diagnosis not present

## 2017-12-04 DIAGNOSIS — G309 Alzheimer's disease, unspecified: Secondary | ICD-10-CM | POA: Insufficient documentation

## 2017-12-04 DIAGNOSIS — I10 Essential (primary) hypertension: Secondary | ICD-10-CM

## 2017-12-04 DIAGNOSIS — M109 Gout, unspecified: Secondary | ICD-10-CM | POA: Insufficient documentation

## 2017-12-04 DIAGNOSIS — F0280 Dementia in other diseases classified elsewhere without behavioral disturbance: Secondary | ICD-10-CM | POA: Diagnosis not present

## 2017-12-04 DIAGNOSIS — K219 Gastro-esophageal reflux disease without esophagitis: Secondary | ICD-10-CM | POA: Insufficient documentation

## 2017-12-04 DIAGNOSIS — Z7982 Long term (current) use of aspirin: Secondary | ICD-10-CM | POA: Insufficient documentation

## 2017-12-04 LAB — CBC WITH DIFFERENTIAL/PLATELET
Basophils Absolute: 0.1 10*3/uL (ref 0–0.1)
Basophils Relative: 1 %
EOS ABS: 0 10*3/uL (ref 0–0.7)
Eosinophils Relative: 1 %
HCT: 45.3 % (ref 35.0–47.0)
HEMOGLOBIN: 15.7 g/dL (ref 12.0–16.0)
LYMPHS PCT: 25 %
Lymphs Abs: 2 10*3/uL (ref 1.0–3.6)
MCH: 30.8 pg (ref 26.0–34.0)
MCHC: 34.7 g/dL (ref 32.0–36.0)
MCV: 88.8 fL (ref 80.0–100.0)
Monocytes Absolute: 0.5 10*3/uL (ref 0.2–0.9)
Monocytes Relative: 6 %
NEUTROS PCT: 67 %
Neutro Abs: 5.3 10*3/uL (ref 1.4–6.5)
Platelets: 242 10*3/uL (ref 150–440)
RBC: 5.1 MIL/uL (ref 3.80–5.20)
RDW: 13.3 % (ref 11.5–14.5)
WBC: 7.9 10*3/uL (ref 3.6–11.0)

## 2017-12-04 LAB — IRON AND TIBC
Iron: 84 ug/dL (ref 28–170)
Saturation Ratios: 22 % (ref 10.4–31.8)
TIBC: 380 ug/dL (ref 250–450)
UIBC: 296 ug/dL

## 2017-12-04 LAB — FERRITIN: Ferritin: 174 ng/mL (ref 11–307)

## 2017-12-04 NOTE — Progress Notes (Signed)
The patient c/o having insomnia x several weeks

## 2017-12-08 ENCOUNTER — Ambulatory Visit: Payer: Medicare HMO | Admitting: Oncology

## 2017-12-08 ENCOUNTER — Other Ambulatory Visit: Payer: Medicare HMO

## 2017-12-12 ENCOUNTER — Other Ambulatory Visit: Payer: Self-pay | Admitting: Physician Assistant

## 2017-12-12 DIAGNOSIS — I10 Essential (primary) hypertension: Secondary | ICD-10-CM

## 2018-02-08 ENCOUNTER — Other Ambulatory Visit: Payer: Self-pay | Admitting: Physician Assistant

## 2018-02-08 DIAGNOSIS — E7800 Pure hypercholesterolemia, unspecified: Secondary | ICD-10-CM

## 2018-02-08 MED ORDER — LOVASTATIN 40 MG PO TABS: 40.0000 mg | ORAL_TABLET | Freq: Every day | ORAL | 1 refills | Status: DC

## 2018-02-08 NOTE — Telephone Encounter (Signed)
Pt requesting a refill on the following medication at [facility]. Thanks CC  lovastatin (MEVACOR) 40 MG tablet

## 2018-02-10 ENCOUNTER — Ambulatory Visit: Payer: Medicare HMO | Admitting: Obstetrics and Gynecology

## 2018-02-10 ENCOUNTER — Encounter: Payer: Self-pay | Admitting: Obstetrics and Gynecology

## 2018-02-10 VITALS — BP 137/65 | HR 55 | Ht 62.0 in | Wt 140.1 lb

## 2018-02-10 DIAGNOSIS — Z4689 Encounter for fitting and adjustment of other specified devices: Secondary | ICD-10-CM | POA: Diagnosis not present

## 2018-02-10 DIAGNOSIS — N814 Uterovaginal prolapse, unspecified: Secondary | ICD-10-CM

## 2018-02-10 NOTE — Progress Notes (Signed)
Pt stated that the pessary is helping her. Pt stated that she is doing well with no concerns.

## 2018-02-10 NOTE — Progress Notes (Signed)
    GYNECOLOGY PROGRESS NOTE  Subjective:    Patient ID: Lauren Manning, female    DOB: 11-10-1935, 82 y.o.   MRN: [id number]  HPI  Patient is an 82 y.o. G26P2002 female who presents for 3 month pessary check.  She reports no vaginal bleeding or discharge. She denies pelvic discomfort and difficulty urinating or moving her bowels.  The following portions of the patient's history were reviewed and updated as appropriate: allergies, current medications, past family history, past medical history, past social history, past surgical history and problem list.  Review of Systems A comprehensive review of systems was negative.   Objective:   Blood pressure 137/65, pulse (!) 55, height  (1.575 m), weight 140 lb 1.6 oz (63.5 kg). General appearance: alert and no distress Abdomen: soft, non-tender; bowel sounds normal; no masses,  no organomegaly Pelvic: The patient's size 2 3/4 Gelhorn pessary was removed, cleaned and replaced without complications. Speculum examination revealed normal vaginal mucosa with no lesions or lacerations.  Assessment:   Encounter for pessary maintenance Cystocele Grade 3 with cervical prolapse  Plan:   - The patient should return in 3-4 months for a pessary check.  Notes non-compliance with use of Trimo-San gel. Strongly encouraged to use once weekly.     Hildred Laser, MD [facility]

## 2018-03-05 ENCOUNTER — Telehealth: Payer: Self-pay

## 2018-03-05 NOTE — Telephone Encounter (Signed)
Called pt to schedule AWV and pt declined scheduling the apt today due to not feeling well. Advised pt I would CB next week.  -MM

## 2018-03-09 NOTE — Telephone Encounter (Signed)
Called pt again to schedule AWV and pt states they were getting ready to leave to go out of town and she did not know when they'd be back but that she would call once they return. -MM

## 2018-04-06 ENCOUNTER — Telehealth: Payer: Self-pay | Admitting: Physician Assistant

## 2018-04-06 NOTE — Telephone Encounter (Signed)
I left a message asking the pt to call me at [phone] to schedule AWV. VDM (DD) °

## 2018-04-08 ENCOUNTER — Encounter: Payer: Self-pay | Admitting: Physician Assistant

## 2018-04-08 ENCOUNTER — Ambulatory Visit
Admission: RE | Admit: 2018-04-08 | Discharge: 2018-04-08 | Disposition: A | Discharge status: Home / Self Care | Payer: Medicare HMO | Source: Ambulatory Visit | Attending: Physician Assistant | Admitting: Physician Assistant

## 2018-04-08 ENCOUNTER — Ambulatory Visit (INDEPENDENT_AMBULATORY_CARE_PROVIDER_SITE_OTHER): Payer: Medicare HMO | Admitting: Physician Assistant

## 2018-04-08 VITALS — BP 122/62 | HR 77 | Temp 98.0°F

## 2018-04-08 DIAGNOSIS — F0280 Dementia in other diseases classified elsewhere without behavioral disturbance: Secondary | ICD-10-CM

## 2018-04-08 DIAGNOSIS — M5442 Lumbago with sciatica, left side: Secondary | ICD-10-CM

## 2018-04-08 DIAGNOSIS — R197 Diarrhea, unspecified: Secondary | ICD-10-CM | POA: Diagnosis not present

## 2018-04-08 DIAGNOSIS — M545 Low back pain: Secondary | ICD-10-CM | POA: Diagnosis not present

## 2018-04-08 DIAGNOSIS — G301 Alzheimer's disease with late onset: Secondary | ICD-10-CM

## 2018-04-08 DIAGNOSIS — R2689 Other abnormalities of gait and mobility: Secondary | ICD-10-CM | POA: Insufficient documentation

## 2018-04-08 DIAGNOSIS — M47816 Spondylosis without myelopathy or radiculopathy, lumbar region: Secondary | ICD-10-CM | POA: Diagnosis not present

## 2018-04-08 MED ORDER — PREDNISONE 10 MG (21) PO TBPK: ORAL_TABLET | ORAL | 0 refills | Status: DC

## 2018-04-08 NOTE — Progress Notes (Signed)
Patient: Lauren Manning Female    DOB: Feb 09, 1936   82 y.o.   MRN: [id number] Visit Date: 04/08/2018  Today's Provider: Margaretann Loveless, PA-C   Chief Complaint  Patient presents with  . Diarrhea  . Hip Pain   Subjective:    Hip Pain   Incident onset: Sunday. There was no injury mechanism. The pain is present in the left hip. The quality of the pain is described as aching. The pain has been constant since onset. Associated symptoms include an inability to bear weight. Pertinent negatives include no numbness. The symptoms are aggravated by weight bearing. She has tried heat for the symptoms. The treatment provided no relief.   Patient is a rather poor historian due to Alzheimer's disease. Daughter accompanies patient today and gives most of the answers. Daughter does not live with patient, however.   Daughter reports on Sunday she noticed her mother was not wanting to bear weight on to her left leg. She has been found in the floor on many occasions over the last week. Patient denies any falls just states she felt more comfortable scooting around due to "hip pain." She also was found once on the floor with urinary incontinence. She is able to bear weight with assistance and will walk if she has a walker with her. She will not stand or walk unassisted. She is able to move the left leg and hip actively while sitting without complaints of pain. Unsure of cause.   Patient's daughter would also like to discuss chronic diarrhea. She is requesting treatment or a referral. [place] patient she has been having diarrhea for over a year now. It is not daily. Daughter reports that her mother tells her she has 3-4 loose BM in the morning only. Denies abdominal cramping. When discussing with the patient she states it is loose then progresses to water the more BM she has. She can then go without a BM all day and sometimes the following morning may not have diarrhea, but then can have days where she will have  diarrhea 3-4 days in a row. No fecal incontinence reported. She has not taken anything for diarrhea. Denies nausea, vomiting, fever, melena or hematochezia.     Allergies  Allergen Reactions  . Enalapril Other (See Comments)    Pt. Denies this allergy Other reaction(s): Unknown  . Penicillins     Has patient had a PCN reaction causing immediate rash, facial/tongue/throat swelling, SOB or lightheadedness with hypotension:unsure Has patient had a PCN reaction causing severe rash involving mucus membranes or skin necrosis:unsure Has patient had a PCN reaction that required hospitalization:unsure Has patient had a PCN reaction occurring within the last 10 years:unsure If all of the above answers are "NO", then may proceed with Cephalosporin use.  Other reaction(s): Unknown     Current Outpatient Medications:  .  amLODipine (NORVASC) 10 MG tablet, TAKE 1 TABLET (10 MG TOTAL) BY MOUTH DAILY., Disp: 90 tablet, Rfl: 1 .  aspirin 81 MG tablet, Take 81 mg by mouth daily., Disp: , Rfl:  .  donepezil (ARICEPT) 10 MG tablet, Take 1 tablet (10 mg total) by mouth at bedtime., Disp: 90 tablet, Rfl: 3 .  hydrochlorothiazide (HYDRODIURIL) 12.5 MG tablet, Take 1 tablet (12.5 mg total) by mouth daily., Disp: 30 tablet, Rfl: 11 .  lovastatin (MEVACOR) 40 MG tablet, Take 1 tablet (40 mg total) by mouth at bedtime., Disp: 90 tablet, Rfl: 1 .  metoprolol succinate (TOPROL-XL) 25 MG 24 hr  tablet, TAKE 1 TABLET (25 MG TOTAL) BY MOUTH DAILY., Disp: 90 tablet, Rfl: 1  Review of Systems  Constitutional: Negative.   Respiratory: Negative.   Cardiovascular: Negative.   Gastrointestinal: Positive for diarrhea. Negative for abdominal distention, abdominal pain, anal bleeding, blood in stool, constipation, nausea and vomiting.  Musculoskeletal: Positive for arthralgias, back pain, gait problem and myalgias.  Skin: Negative.   Neurological: Positive for weakness. Negative for numbness.    Social History    Tobacco Use  . Smoking status: Never Smoker  . Smokeless tobacco: Never Used  Substance Use Topics  . Alcohol use: No   Objective:   BP 122/62 (BP Location: Right Arm, Patient Position: Sitting, Cuff Size: Normal)   Pulse 77   Temp 98 F (36.7 C) (Oral)   SpO2 99%    Physical Exam  Constitutional: She appears well-developed and well-nourished. No distress.  Neck: Normal range of motion. Neck supple.  Cardiovascular: Normal rate, regular rhythm and normal heart sounds. Exam reveals no gallop and no friction rub.  No murmur heard. Pulmonary/Chest: Effort normal and breath sounds normal. No respiratory distress. She has no wheezes. She has no rales.  Abdominal: Soft. Bowel sounds are normal. She exhibits no distension. There is no tenderness. There is no guarding.  Musculoskeletal:       Left hip: She exhibits decreased strength. She exhibits normal range of motion, no tenderness and no bony tenderness.       Thoracic back: Normal.       Lumbar back: She exhibits decreased range of motion and tenderness. She exhibits no bony tenderness, no pain and no spasm.       Back:  PROM of left hip is normal. Mild pain at maximal IR of left hip passively. AROM normal but limited due to patient participation, she denied pain. Was able to stand patient with assistance on each side and walk patient. She was able to bear weight and walk with assistance only. Pain was mostly from left lower back. Exam limited of back due to patient stability and mobility.   Skin: She is not diaphoretic.  Vitals reviewed.       Assessment & Plan:     1. Acute left-sided low back pain with left-sided sciatica Will get imaging as below to check for compression fracture or displacement that may be causing radiculopathy down left leg. Will also get [facility] services to see what patient may qualify for. She definitely requires nursing and PT. Will also request medical CSW assistance as patient may be at a stage for  assisted living placement. Daughter is in agreement with [facility] services. I will give steroid taper as below for back pain. Daughter is to call in 1-2 weeks if symptoms have not improved or worsened.  - DG Lumbar Spine Complete; Future - DG Sacrum/Coccyx; Future - predniSONE (STERAPRED UNI-PAK 21 TAB) 10 MG (21) TBPK tablet; 6 day taper; take as directed on package instructions  Dispense: 21 tablet; Refill: 0 - Ambulatory referral to Home Health  2. Inability to bear weight - DG Lumbar Spine Complete; Future - DG Sacrum/Coccyx; Future - predniSONE (STERAPRED UNI-PAK 21 TAB) 10 MG (21) TBPK tablet; 6 day taper; take as directed on package instructions  Dispense: 21 tablet; Refill: 0 - Ambulatory referral to Home Health  3. Diarrhea, unspecified type Will await treatment of the back pain and left leg weakness initially. Discussed using Imodium in the meantime while we try to figure out what is going on with the  left leg and her not walking. Once more stable will get imaging to work up diarrhea. I am suspicious of functional diarrhea secondary to constipation. Daughter is in agreement.  - Ambulatory referral to Home Health  4. Late onset Alzheimer's disease without behavioral disturbance - Ambulatory referral to Home Health       Margaretann LovelessJennifer M Burnette, PA-C  [facility]

## 2018-04-09 ENCOUNTER — Telehealth: Payer: Self-pay

## 2018-04-09 NOTE — Telephone Encounter (Signed)
Left message for patient's daughter Aram BeechamCynthia to return call.

## 2018-04-09 NOTE — Telephone Encounter (Signed)
-----   Message from Margaretann LovelessJennifer M Burnette, PA-C sent at 04/09/2018 10:07 AM EDT ----- Arthritic changes noted throughout lumbar spine but no abnormal alignment or fractures noted. Sacrum and coccyx are also normal. Call daughter with results. HH should be contacting them today or Monday.

## 2018-04-09 NOTE — Telephone Encounter (Signed)
Patient's daughter Lauren Manning advised

## 2018-04-10 DIAGNOSIS — M5442 Lumbago with sciatica, left side: Secondary | ICD-10-CM | POA: Diagnosis not present

## 2018-04-10 DIAGNOSIS — G301 Alzheimer's disease with late onset: Secondary | ICD-10-CM | POA: Diagnosis not present

## 2018-04-10 DIAGNOSIS — I10 Essential (primary) hypertension: Secondary | ICD-10-CM | POA: Diagnosis not present

## 2018-04-10 DIAGNOSIS — F0280 Dementia in other diseases classified elsewhere without behavioral disturbance: Secondary | ICD-10-CM | POA: Diagnosis not present

## 2018-04-10 DIAGNOSIS — M160 Bilateral primary osteoarthritis of hip: Secondary | ICD-10-CM | POA: Diagnosis not present

## 2018-04-10 DIAGNOSIS — M479 Spondylosis, unspecified: Secondary | ICD-10-CM | POA: Diagnosis not present

## 2018-04-12 DIAGNOSIS — I10 Essential (primary) hypertension: Secondary | ICD-10-CM | POA: Diagnosis not present

## 2018-04-12 DIAGNOSIS — G301 Alzheimer's disease with late onset: Secondary | ICD-10-CM | POA: Diagnosis not present

## 2018-04-12 DIAGNOSIS — M5442 Lumbago with sciatica, left side: Secondary | ICD-10-CM | POA: Diagnosis not present

## 2018-04-12 DIAGNOSIS — F0280 Dementia in other diseases classified elsewhere without behavioral disturbance: Secondary | ICD-10-CM | POA: Diagnosis not present

## 2018-04-12 DIAGNOSIS — M160 Bilateral primary osteoarthritis of hip: Secondary | ICD-10-CM | POA: Diagnosis not present

## 2018-04-12 DIAGNOSIS — M479 Spondylosis, unspecified: Secondary | ICD-10-CM | POA: Diagnosis not present

## 2018-04-13 ENCOUNTER — Encounter: Payer: Self-pay | Admitting: Physician Assistant

## 2018-04-14 DIAGNOSIS — G301 Alzheimer's disease with late onset: Secondary | ICD-10-CM | POA: Diagnosis not present

## 2018-04-14 DIAGNOSIS — M160 Bilateral primary osteoarthritis of hip: Secondary | ICD-10-CM | POA: Diagnosis not present

## 2018-04-14 DIAGNOSIS — M5442 Lumbago with sciatica, left side: Secondary | ICD-10-CM | POA: Diagnosis not present

## 2018-04-14 DIAGNOSIS — F0280 Dementia in other diseases classified elsewhere without behavioral disturbance: Secondary | ICD-10-CM | POA: Diagnosis not present

## 2018-04-14 DIAGNOSIS — I10 Essential (primary) hypertension: Secondary | ICD-10-CM | POA: Diagnosis not present

## 2018-04-14 DIAGNOSIS — M479 Spondylosis, unspecified: Secondary | ICD-10-CM | POA: Diagnosis not present

## 2018-04-15 DIAGNOSIS — I10 Essential (primary) hypertension: Secondary | ICD-10-CM | POA: Diagnosis not present

## 2018-04-15 DIAGNOSIS — F0280 Dementia in other diseases classified elsewhere without behavioral disturbance: Secondary | ICD-10-CM | POA: Diagnosis not present

## 2018-04-15 DIAGNOSIS — G301 Alzheimer's disease with late onset: Secondary | ICD-10-CM | POA: Diagnosis not present

## 2018-04-15 DIAGNOSIS — M479 Spondylosis, unspecified: Secondary | ICD-10-CM | POA: Diagnosis not present

## 2018-04-15 DIAGNOSIS — M5442 Lumbago with sciatica, left side: Secondary | ICD-10-CM | POA: Diagnosis not present

## 2018-04-15 DIAGNOSIS — M160 Bilateral primary osteoarthritis of hip: Secondary | ICD-10-CM | POA: Diagnosis not present

## 2018-04-16 DIAGNOSIS — G301 Alzheimer's disease with late onset: Secondary | ICD-10-CM | POA: Diagnosis not present

## 2018-04-16 DIAGNOSIS — I10 Essential (primary) hypertension: Secondary | ICD-10-CM | POA: Diagnosis not present

## 2018-04-16 DIAGNOSIS — M160 Bilateral primary osteoarthritis of hip: Secondary | ICD-10-CM | POA: Diagnosis not present

## 2018-04-16 DIAGNOSIS — M5442 Lumbago with sciatica, left side: Secondary | ICD-10-CM | POA: Diagnosis not present

## 2018-04-16 DIAGNOSIS — M479 Spondylosis, unspecified: Secondary | ICD-10-CM | POA: Diagnosis not present

## 2018-04-16 DIAGNOSIS — F0280 Dementia in other diseases classified elsewhere without behavioral disturbance: Secondary | ICD-10-CM | POA: Diagnosis not present

## 2018-04-19 DIAGNOSIS — M160 Bilateral primary osteoarthritis of hip: Secondary | ICD-10-CM | POA: Diagnosis not present

## 2018-04-19 DIAGNOSIS — M479 Spondylosis, unspecified: Secondary | ICD-10-CM | POA: Diagnosis not present

## 2018-04-19 DIAGNOSIS — G301 Alzheimer's disease with late onset: Secondary | ICD-10-CM | POA: Diagnosis not present

## 2018-04-19 DIAGNOSIS — F0280 Dementia in other diseases classified elsewhere without behavioral disturbance: Secondary | ICD-10-CM | POA: Diagnosis not present

## 2018-04-19 DIAGNOSIS — M5442 Lumbago with sciatica, left side: Secondary | ICD-10-CM | POA: Diagnosis not present

## 2018-04-19 DIAGNOSIS — I10 Essential (primary) hypertension: Secondary | ICD-10-CM | POA: Diagnosis not present

## 2018-04-20 DIAGNOSIS — F0280 Dementia in other diseases classified elsewhere without behavioral disturbance: Secondary | ICD-10-CM | POA: Diagnosis not present

## 2018-04-20 DIAGNOSIS — M5442 Lumbago with sciatica, left side: Secondary | ICD-10-CM | POA: Diagnosis not present

## 2018-04-20 DIAGNOSIS — G301 Alzheimer's disease with late onset: Secondary | ICD-10-CM | POA: Diagnosis not present

## 2018-04-20 DIAGNOSIS — M160 Bilateral primary osteoarthritis of hip: Secondary | ICD-10-CM | POA: Diagnosis not present

## 2018-04-20 DIAGNOSIS — I10 Essential (primary) hypertension: Secondary | ICD-10-CM | POA: Diagnosis not present

## 2018-04-20 DIAGNOSIS — M479 Spondylosis, unspecified: Secondary | ICD-10-CM | POA: Diagnosis not present

## 2018-04-23 ENCOUNTER — Other Ambulatory Visit: Payer: Self-pay | Admitting: Physician Assistant

## 2018-04-23 DIAGNOSIS — F0280 Dementia in other diseases classified elsewhere without behavioral disturbance: Secondary | ICD-10-CM | POA: Diagnosis not present

## 2018-04-23 DIAGNOSIS — M479 Spondylosis, unspecified: Secondary | ICD-10-CM | POA: Diagnosis not present

## 2018-04-23 DIAGNOSIS — M160 Bilateral primary osteoarthritis of hip: Secondary | ICD-10-CM | POA: Diagnosis not present

## 2018-04-23 DIAGNOSIS — M5442 Lumbago with sciatica, left side: Secondary | ICD-10-CM | POA: Diagnosis not present

## 2018-04-23 DIAGNOSIS — G301 Alzheimer's disease with late onset: Secondary | ICD-10-CM | POA: Diagnosis not present

## 2018-04-23 DIAGNOSIS — I10 Essential (primary) hypertension: Secondary | ICD-10-CM | POA: Diagnosis not present

## 2018-04-23 DIAGNOSIS — M545 Low back pain, unspecified: Secondary | ICD-10-CM

## 2018-04-23 MED ORDER — MELOXICAM 7.5 MG PO TABS: 7.5000 mg | ORAL_TABLET | Freq: Every day | ORAL | 0 refills | Status: DC

## 2018-04-23 NOTE — Telephone Encounter (Signed)
Please Review.  Thanks,  - 

## 2018-04-23 NOTE — Telephone Encounter (Signed)
Pt's daughter Aram BeechamCynthia stated that pt had OV with Antony ContrasJenni on 04/08/18 for back pain was started predniSONE (STERAPRED UNI-PAK 21 TAB) 10 MG (21) TBPK tablet. Aram BeechamCynthia is requesting a refill for predniSONE (STERAPRED UNI-PAK 21 TAB) 10 MG (21) TBPK tablet or something else be [facility] for pt's back pain because she doesn't want pt's pain to get back the way it was when she came in for OV. Please advise. Thanks TNP

## 2018-04-23 NOTE — Telephone Encounter (Signed)
Sent in meloxicam for her to try

## 2018-04-23 NOTE — Addendum Note (Signed)
Addended by: Margaretann LovelessBURNETTE,  M on: 04/23/2018 12:24 PM   Modules accepted: Orders

## 2018-04-26 DIAGNOSIS — M160 Bilateral primary osteoarthritis of hip: Secondary | ICD-10-CM | POA: Diagnosis not present

## 2018-04-26 DIAGNOSIS — I10 Essential (primary) hypertension: Secondary | ICD-10-CM | POA: Diagnosis not present

## 2018-04-26 DIAGNOSIS — G301 Alzheimer's disease with late onset: Secondary | ICD-10-CM | POA: Diagnosis not present

## 2018-04-26 DIAGNOSIS — M5442 Lumbago with sciatica, left side: Secondary | ICD-10-CM | POA: Diagnosis not present

## 2018-04-26 DIAGNOSIS — F0280 Dementia in other diseases classified elsewhere without behavioral disturbance: Secondary | ICD-10-CM | POA: Diagnosis not present

## 2018-04-26 DIAGNOSIS — M479 Spondylosis, unspecified: Secondary | ICD-10-CM | POA: Diagnosis not present

## 2018-04-27 DIAGNOSIS — M160 Bilateral primary osteoarthritis of hip: Secondary | ICD-10-CM | POA: Diagnosis not present

## 2018-04-27 DIAGNOSIS — M5442 Lumbago with sciatica, left side: Secondary | ICD-10-CM | POA: Diagnosis not present

## 2018-04-27 DIAGNOSIS — G301 Alzheimer's disease with late onset: Secondary | ICD-10-CM | POA: Diagnosis not present

## 2018-04-27 DIAGNOSIS — F0280 Dementia in other diseases classified elsewhere without behavioral disturbance: Secondary | ICD-10-CM | POA: Diagnosis not present

## 2018-04-27 DIAGNOSIS — M479 Spondylosis, unspecified: Secondary | ICD-10-CM | POA: Diagnosis not present

## 2018-04-27 DIAGNOSIS — I10 Essential (primary) hypertension: Secondary | ICD-10-CM | POA: Diagnosis not present

## 2018-04-27 NOTE — Telephone Encounter (Signed)
See previous TE. Pt has been tried numerous times and spoken to about scheduling the AWV. Pt either declined or is not CB. Closing TE. -MM

## 2018-04-28 DIAGNOSIS — I10 Essential (primary) hypertension: Secondary | ICD-10-CM | POA: Diagnosis not present

## 2018-04-28 DIAGNOSIS — M160 Bilateral primary osteoarthritis of hip: Secondary | ICD-10-CM | POA: Diagnosis not present

## 2018-04-28 DIAGNOSIS — G301 Alzheimer's disease with late onset: Secondary | ICD-10-CM | POA: Diagnosis not present

## 2018-04-28 DIAGNOSIS — M5442 Lumbago with sciatica, left side: Secondary | ICD-10-CM | POA: Diagnosis not present

## 2018-04-28 DIAGNOSIS — F0280 Dementia in other diseases classified elsewhere without behavioral disturbance: Secondary | ICD-10-CM | POA: Diagnosis not present

## 2018-04-28 DIAGNOSIS — M479 Spondylosis, unspecified: Secondary | ICD-10-CM | POA: Diagnosis not present

## 2018-05-04 DIAGNOSIS — M479 Spondylosis, unspecified: Secondary | ICD-10-CM | POA: Diagnosis not present

## 2018-05-04 DIAGNOSIS — M5442 Lumbago with sciatica, left side: Secondary | ICD-10-CM | POA: Diagnosis not present

## 2018-05-04 DIAGNOSIS — F0280 Dementia in other diseases classified elsewhere without behavioral disturbance: Secondary | ICD-10-CM | POA: Diagnosis not present

## 2018-05-04 DIAGNOSIS — G301 Alzheimer's disease with late onset: Secondary | ICD-10-CM | POA: Diagnosis not present

## 2018-05-04 DIAGNOSIS — M160 Bilateral primary osteoarthritis of hip: Secondary | ICD-10-CM | POA: Diagnosis not present

## 2018-05-04 DIAGNOSIS — I10 Essential (primary) hypertension: Secondary | ICD-10-CM | POA: Diagnosis not present

## 2018-05-12 ENCOUNTER — Telehealth: Payer: Self-pay | Admitting: Physician Assistant

## 2018-05-12 DIAGNOSIS — M1990 Unspecified osteoarthritis, unspecified site: Secondary | ICD-10-CM

## 2018-05-12 MED ORDER — DICLOFENAC SODIUM 50 MG PO TBEC: 50.0000 mg | DELAYED_RELEASE_TABLET | Freq: Two times a day (BID) | ORAL | 0 refills | Status: DC

## 2018-05-12 NOTE — Telephone Encounter (Signed)
Patient advised as below.  

## 2018-05-12 NOTE — Telephone Encounter (Signed)
Stop meloxicam and change to diclofenac 50mg  BID. Sent to [facility]

## 2018-05-12 NOTE — Telephone Encounter (Signed)
pt's daughter called saying her mom thinks the meloxicam 7.5 is making her itch. She is wanting to know if there is something else she can take for her arthritis   [facility]  CB#  [phone]  Thanks Barth Kirksteri

## 2018-05-26 ENCOUNTER — Other Ambulatory Visit: Payer: Self-pay | Admitting: Physician Assistant

## 2018-05-26 DIAGNOSIS — G301 Alzheimer's disease with late onset: Principal | ICD-10-CM

## 2018-05-26 DIAGNOSIS — F0280 Dementia in other diseases classified elsewhere without behavioral disturbance: Secondary | ICD-10-CM

## 2018-06-10 ENCOUNTER — Other Ambulatory Visit: Payer: Self-pay | Admitting: Physician Assistant

## 2018-06-10 DIAGNOSIS — M1990 Unspecified osteoarthritis, unspecified site: Secondary | ICD-10-CM

## 2018-06-15 ENCOUNTER — Encounter: Payer: Medicare HMO | Admitting: Obstetrics and Gynecology

## 2018-06-16 ENCOUNTER — Telehealth: Payer: Self-pay | Admitting: Obstetrics and Gynecology

## 2018-06-16 NOTE — Telephone Encounter (Signed)
Patient came into the office for an appointment, I informed that patient that her apt was yesterday and we are more than happy to schedule her appointment. Her daughter with her was very upset and spoke over me and when I asked her what day she would like to reschedule to she stated "I don't know I work" I informed the patients daughter that she can call back at a later date to reschedule the apt if she'd like. The patients daughter turned around and walked off and the patient followed. Please advise.

## 2018-06-28 ENCOUNTER — Other Ambulatory Visit: Payer: Self-pay | Admitting: Physician Assistant

## 2018-06-28 DIAGNOSIS — I10 Essential (primary) hypertension: Secondary | ICD-10-CM

## 2018-07-27 ENCOUNTER — Other Ambulatory Visit: Payer: Self-pay | Admitting: Physician Assistant

## 2018-07-27 DIAGNOSIS — I10 Essential (primary) hypertension: Secondary | ICD-10-CM

## 2018-08-02 ENCOUNTER — Encounter: Payer: Self-pay | Admitting: Obstetrics and Gynecology

## 2018-08-02 ENCOUNTER — Ambulatory Visit: Payer: Medicare HMO | Admitting: Obstetrics and Gynecology

## 2018-08-02 VITALS — BP 147/72 | HR 43 | Ht 62.0 in | Wt 138.1 lb

## 2018-08-02 DIAGNOSIS — N952 Postmenopausal atrophic vaginitis: Secondary | ICD-10-CM

## 2018-08-02 DIAGNOSIS — N8110 Cystocele, unspecified: Secondary | ICD-10-CM

## 2018-08-02 DIAGNOSIS — Z960 Presence of urogenital implants: Secondary | ICD-10-CM

## 2018-08-02 NOTE — Progress Notes (Signed)
    GYNECOLOGY PROGRESS NOTE  Subjective:    Patient ID: Lauren Manning, female    DOB: 1936-03-14, 82 y.o.   MRN: [id number]  HPI  Patient is a 82 y.o. G17P2002 female who presents for 3 month pessary check.  She reports no vaginal bleeding or discharge. She denies pelvic discomfort and difficulty urinating or moving her bowels.  The following portions of the patient's history were reviewed and updated as appropriate: allergies, current medications, past family history, past medical history, past social history, past surgical history and problem list.  Review of Systems A comprehensive review of systems was negative.   Objective:   Blood pressure (!) 147/72, pulse (!) 43, height 5\' 2"  (1.575 m), weight 138 lb 1.6 oz (62.6 kg). General appearance: alert and no distress Abdomen: soft, non-tender; bowel sounds normal; no masses,  no organomegaly Pelvic: The patient's size 2 3/4 Gelhorn pessary was removed, cleaned and replaced without complications. Speculum examination revealed normal vaginal mucosa with no lesions or lacerations.  Assessment:   Encounter for pessary maintenance Cystocele Grade 3 with cervical prolapse Vaginal atrophy  Plan:   - The patient should return in 3-4 months for a pessary check.  Continues to note non-compliance with use of Trimo-San gel (notes that she forgets). Strongly encouraged to use at least once monthly.     Hildred Laser, MD [facility]

## 2018-08-02 NOTE — Progress Notes (Signed)
Pt is present today for pessary check. Pt stated that the pessary is working well for her. No complaints.

## 2018-08-14 ENCOUNTER — Other Ambulatory Visit: Payer: Self-pay | Admitting: Physician Assistant

## 2018-08-14 DIAGNOSIS — E7800 Pure hypercholesterolemia, unspecified: Secondary | ICD-10-CM

## 2018-12-02 ENCOUNTER — Other Ambulatory Visit: Payer: Self-pay | Admitting: Physician Assistant

## 2018-12-02 DIAGNOSIS — I10 Essential (primary) hypertension: Secondary | ICD-10-CM

## 2018-12-02 NOTE — Telephone Encounter (Signed)
Pt called asking why the pharmacy told her they could not fill her Hydrocholorothiazide 12.5  CB# [phone]  Thanks Barth Kirks

## 2018-12-30 ENCOUNTER — Other Ambulatory Visit: Payer: Self-pay | Admitting: Physician Assistant

## 2018-12-30 DIAGNOSIS — M1990 Unspecified osteoarthritis, unspecified site: Secondary | ICD-10-CM

## 2019-01-13 ENCOUNTER — Other Ambulatory Visit: Payer: Self-pay

## 2019-01-13 DIAGNOSIS — I10 Essential (primary) hypertension: Secondary | ICD-10-CM

## 2019-01-13 NOTE — Telephone Encounter (Signed)
Patients daughter [name] called requesting a refill to be sent to [facility] order pharmacy. Please advise.

## 2019-01-17 ENCOUNTER — Other Ambulatory Visit: Payer: Self-pay | Admitting: Physician Assistant

## 2019-01-17 DIAGNOSIS — E7800 Pure hypercholesterolemia, unspecified: Secondary | ICD-10-CM

## 2019-01-17 MED ORDER — METOPROLOL SUCCINATE ER 25 MG PO TB24: 25.0000 mg | ORAL_TABLET | Freq: Every day | ORAL | 1 refills | Status: DC

## 2019-01-17 NOTE — Telephone Encounter (Signed)
LOV 04/08/2018

## 2019-01-25 ENCOUNTER — Telehealth: Payer: Self-pay | Admitting: Obstetrics and Gynecology

## 2019-01-25 NOTE — Telephone Encounter (Signed)
The patients daughter called and stated that she would like to speak with a nurse in regards to the patient needing pessary cleaning and maintenance. The patient did not disclose any other information. Please advise.

## 2019-01-25 NOTE — Telephone Encounter (Signed)
Pt's daughter made an appointment for her mother to visit [facility] for pessary check/cleaning.

## 2019-03-03 ENCOUNTER — Ambulatory Visit: Payer: Self-pay | Admitting: Obstetrics and Gynecology

## 2019-03-28 ENCOUNTER — Other Ambulatory Visit: Payer: Self-pay | Admitting: Physician Assistant

## 2019-03-28 DIAGNOSIS — I10 Essential (primary) hypertension: Secondary | ICD-10-CM

## 2019-05-03 ENCOUNTER — Encounter: Payer: Self-pay | Admitting: Obstetrics and Gynecology

## 2019-05-03 ENCOUNTER — Other Ambulatory Visit: Payer: Self-pay | Admitting: Physician Assistant

## 2019-05-03 ENCOUNTER — Other Ambulatory Visit: Payer: Self-pay

## 2019-05-03 ENCOUNTER — Ambulatory Visit (INDEPENDENT_AMBULATORY_CARE_PROVIDER_SITE_OTHER): Payer: Medicare HMO | Admitting: Obstetrics and Gynecology

## 2019-05-03 VITALS — BP 166/78 | HR 54 | Ht 62.0 in | Wt 137.2 lb

## 2019-05-03 DIAGNOSIS — N8110 Cystocele, unspecified: Secondary | ICD-10-CM

## 2019-05-03 DIAGNOSIS — N952 Postmenopausal atrophic vaginitis: Secondary | ICD-10-CM

## 2019-05-03 DIAGNOSIS — Z960 Presence of urogenital implants: Secondary | ICD-10-CM

## 2019-05-03 DIAGNOSIS — I10 Essential (primary) hypertension: Secondary | ICD-10-CM

## 2019-05-03 MED ORDER — PREMARIN 0.625 MG/GM VA CREA: 1.0000 | TOPICAL_CREAM | VAGINAL | 12 refills | Status: DC

## 2019-05-03 NOTE — Progress Notes (Signed)
    GYNECOLOGY PROGRESS NOTE  Subjective:    Patient ID: Lauren Manning, female    DOB: Feb 07, 1936, 83 y.o.   MRN: [id number]  HPI  Patient is a 83 y.o. G63P2002 female who presents for a pessary check. Last seen in November 2019. She reports no vaginal bleeding or discharge. She denies pelvic discomfort and difficulty urinating or moving her bowels.  The following portions of the patient's history were reviewed and updated as appropriate: allergies, current medications, past family history, past medical history, past social history, past surgical history and problem list.  Review of Systems A comprehensive review of systems was negative.   Objective:   Blood pressure (!) 166/78, pulse (!) 54, height 5\' 2"  (1.575 m), weight 137 lb 3.2 oz (62.2 kg). General appearance: alert and no distress Abdomen: soft, non-tender; bowel sounds normal; no masses,  no organomegaly Pelvic: The patient's size 2 3/4 Gelhorn pessary was removed, cleaned and replaced without complications. Speculum examination revealed normal vaginal mucosa with no lesions or lacerations.  Assessment:   Encounter for pessary maintenance Cystocele Grade 3 with cervical prolapse Vaginal atrophy Hypertension Plan:   - The patient should return in 3-4 months for a pessary check.  Has been non-compliant with Trimo-san gel. Recommend use of Premarin cream instead. Given samples. To apply twice weekly. Also given prescription.  - Elevated BP today in setting of patient's known hypertension.  Asymptomatic.    Rubie Maid, MD [facility]

## 2019-05-03 NOTE — Progress Notes (Signed)
Pt is present for pessary check. Pt stated that she is doing well no problems.  

## 2019-05-11 ENCOUNTER — Telehealth: Payer: Self-pay | Admitting: Obstetrics and Gynecology

## 2019-05-11 NOTE — Telephone Encounter (Signed)
The patient called and stated that her medication is way to expensive and she needs to know if she is able to use a cheaper cream. The patient is requesting a call back. Please advise.

## 2019-05-18 NOTE — Telephone Encounter (Signed)
Pt called no answer LM to call the office to speak about her medication.

## 2019-06-01 ENCOUNTER — Other Ambulatory Visit: Payer: Self-pay | Admitting: Physician Assistant

## 2019-06-01 DIAGNOSIS — F0280 Dementia in other diseases classified elsewhere without behavioral disturbance: Secondary | ICD-10-CM

## 2019-08-09 ENCOUNTER — Other Ambulatory Visit: Payer: Self-pay | Admitting: Physician Assistant

## 2019-08-09 DIAGNOSIS — I10 Essential (primary) hypertension: Secondary | ICD-10-CM

## 2019-08-09 MED ORDER — AMLODIPINE BESYLATE 10 MG PO TABS: 10.0000 mg | ORAL_TABLET | Freq: Every day | ORAL | 0 refills | Status: DC

## 2019-08-09 NOTE — Telephone Encounter (Signed)
Requested medication (s) are due for refill today: yes  Requested medication (s) are on the active medication list: yes  Last refill:  03/29/2019  Future visit scheduled: no  Notes to clinic:  LOV-04/08/2018 Overdue for office visit  Review for refill    Requested Prescriptions  Pending Prescriptions Disp Refills   metoprolol succinate (TOPROL-XL) 25 MG 24 hr tablet [Pharmacy Med Name: METOPROLOL SUCCINATE ER 25 MG Tablet Extended Release 24 Hour] 90 tablet 1    Sig: TAKE 1 TABLET EVERY DAY     Cardiovascular:  Beta Blockers Failed - 08/09/2019  3:26 PM      Failed - Last BP in normal range    BP Readings from Last 1 Encounters:  05/03/19 (!) 166/78         Failed - Valid encounter within last 6 months    Recent Outpatient Visits          1 year ago Acute left-sided low back pain with left-sided sciatica   [facility] [place], [place], [place]   1 year ago Late onset Alzheimer's disease without behavioral disturbance   [facility] [place], [place], [place]   2 years ago Annual physical exam   [facility] [place], [place], [place]   2 years ago Memory loss   [place], [place], [place]   3 years ago Medicare annual wellness visit, subsequent   [place], [place], [place]             Passed - Last Heart Rate in normal range    Pulse Readings from Last 1 Encounters:  05/03/19 (!) 54

## 2019-08-09 NOTE — Telephone Encounter (Signed)
Medication Refill - Medication: amLODipine (NORVASC) 10 MG tablet   Has the patient contacted their pharmacy? Yes.   (Agent: If no, request that the patient contact the pharmacy for the refill.) (Agent: If yes, when and what did the pharmacy advise?)  Preferred Pharmacy (with phone number or street name): [facility] [place]: Please be advised that RX refills may take up to 3 business days. We ask that you follow-up with your pharmacy.

## 2019-08-10 NOTE — Telephone Encounter (Signed)
Due for CPE/AWV and labs

## 2019-08-12 NOTE — Telephone Encounter (Signed)
Scheduled patient. Lauren Manning patent's daughter advised to call a week before to have patient's labs drawn.

## 2019-09-05 ENCOUNTER — Telehealth: Payer: Self-pay

## 2019-09-05 DIAGNOSIS — I10 Essential (primary) hypertension: Secondary | ICD-10-CM

## 2019-09-05 DIAGNOSIS — E7800 Pure hypercholesterolemia, unspecified: Secondary | ICD-10-CM

## 2019-09-05 DIAGNOSIS — R739 Hyperglycemia, unspecified: Secondary | ICD-10-CM

## 2019-09-05 NOTE — Telephone Encounter (Signed)
Please Review

## 2019-09-05 NOTE — Addendum Note (Signed)
Addended by: [name] on: 09/05/2019 04:58 PM   Modules accepted: Orders

## 2019-09-05 NOTE — Telephone Encounter (Signed)
Copied from [place] [id number]. Topic: General - Inquiry >> Sep 05, 2019 12:39 PM Rayann Heman wrote: Reason for CRM: pt daughter called and stated that she would like orders for labs. She states that she discussed this with the person who scheduled AWV. Please advise. Pt daughter would like a call from the office to confirm

## 2019-09-05 NOTE — Addendum Note (Signed)
Addended by: Doristine Devoid on: 09/05/2019 04:54 PM   Modules accepted: Orders

## 2019-09-05 NOTE — Telephone Encounter (Signed)
Labs ordered for her.

## 2019-09-06 DIAGNOSIS — I10 Essential (primary) hypertension: Secondary | ICD-10-CM | POA: Diagnosis not present

## 2019-09-06 DIAGNOSIS — E7800 Pure hypercholesterolemia, unspecified: Secondary | ICD-10-CM | POA: Diagnosis not present

## 2019-09-06 DIAGNOSIS — R739 Hyperglycemia, unspecified: Secondary | ICD-10-CM | POA: Diagnosis not present

## 2019-09-07 ENCOUNTER — Ambulatory Visit (INDEPENDENT_AMBULATORY_CARE_PROVIDER_SITE_OTHER): Payer: Medicare HMO | Admitting: Physician Assistant

## 2019-09-07 ENCOUNTER — Other Ambulatory Visit: Payer: Self-pay

## 2019-09-07 ENCOUNTER — Encounter: Payer: Self-pay | Admitting: Physician Assistant

## 2019-09-07 VITALS — BP 158/75 | HR 57 | Temp 97.1°F | Resp 16 | Ht 59.75 in | Wt 139.2 lb

## 2019-09-07 DIAGNOSIS — F0280 Dementia in other diseases classified elsewhere without behavioral disturbance: Secondary | ICD-10-CM | POA: Diagnosis not present

## 2019-09-07 DIAGNOSIS — E7800 Pure hypercholesterolemia, unspecified: Secondary | ICD-10-CM | POA: Diagnosis not present

## 2019-09-07 DIAGNOSIS — Z0000 Encounter for general adult medical examination without abnormal findings: Secondary | ICD-10-CM | POA: Diagnosis not present

## 2019-09-07 DIAGNOSIS — G301 Alzheimer's disease with late onset: Secondary | ICD-10-CM

## 2019-09-07 DIAGNOSIS — M1990 Unspecified osteoarthritis, unspecified site: Secondary | ICD-10-CM

## 2019-09-07 DIAGNOSIS — H6123 Impacted cerumen, bilateral: Secondary | ICD-10-CM | POA: Diagnosis not present

## 2019-09-07 DIAGNOSIS — F5101 Primary insomnia: Secondary | ICD-10-CM | POA: Diagnosis not present

## 2019-09-07 DIAGNOSIS — I10 Essential (primary) hypertension: Secondary | ICD-10-CM

## 2019-09-07 LAB — LIPID PANEL
Chol/HDL Ratio: 4.1 ratio (ref 0.0–4.4)
Cholesterol, Total: 186 mg/dL (ref 100–199)
HDL: 45 mg/dL (ref >39)
LDL Chol Calc (NIH): 106 mg/dL — ABNORMAL HIGH (ref 0–99)
Triglycerides: 201 mg/dL — ABNORMAL HIGH (ref 0–149)
VLDL Cholesterol Cal: 35 mg/dL (ref 5–40)

## 2019-09-07 LAB — CBC WITH DIFFERENTIAL/PLATELET
Basophils Absolute: 0.1 10*3/uL (ref 0.0–0.2)
Basos: 1 %
EOS (ABSOLUTE): 0.1 10*3/uL (ref 0.0–0.4)
Eos: 2 %
Hematocrit: 49.2 % — ABNORMAL HIGH (ref 34.0–46.6)
Hemoglobin: 16.4 g/dL — ABNORMAL HIGH (ref 11.1–15.9)
Immature Grans (Abs): 0 10*3/uL (ref 0.0–0.1)
Immature Granulocytes: 0 %
Lymphocytes Absolute: 2 10*3/uL (ref 0.7–3.1)
Lymphs: 26 %
MCH: 30.5 pg (ref 26.6–33.0)
MCHC: 33.3 g/dL (ref 31.5–35.7)
MCV: 91 fL (ref 79–97)
Monocytes Absolute: 0.5 10*3/uL (ref 0.1–0.9)
Monocytes: 6 %
Neutrophils Absolute: 5.1 10*3/uL (ref 1.4–7.0)
Neutrophils: 65 %
Platelets: 272 10*3/uL (ref 150–450)
RBC: 5.38 x10E6/uL — ABNORMAL HIGH (ref 3.77–5.28)
RDW: 12.5 % (ref 11.7–15.4)
WBC: 7.8 10*3/uL (ref 3.4–10.8)

## 2019-09-07 LAB — COMPREHENSIVE METABOLIC PANEL
ALT: 23 IU/L (ref 0–32)
AST: 21 IU/L (ref 0–40)
Albumin/Globulin Ratio: 1.8 (ref 1.2–2.2)
Albumin: 4.4 g/dL (ref 3.6–4.6)
Alkaline Phosphatase: 72 IU/L (ref 39–117)
BUN/Creatinine Ratio: 13 (ref 12–28)
BUN: 16 mg/dL (ref 8–27)
Bilirubin Total: 0.5 mg/dL (ref 0.0–1.2)
CO2: 21 mmol/L (ref 20–29)
Calcium: 10 mg/dL (ref 8.7–10.3)
Chloride: 105 mmol/L (ref 96–106)
Creatinine, Ser: 1.25 mg/dL — ABNORMAL HIGH (ref 0.57–1.00)
GFR calc Af Amer: 47 mL/min/{1.73_m2} — ABNORMAL LOW (ref >59)
GFR calc non Af Amer: 40 mL/min/{1.73_m2} — ABNORMAL LOW (ref >59)
Globulin, Total: 2.5 g/dL (ref 1.5–4.5)
Glucose: 103 mg/dL — ABNORMAL HIGH (ref 65–99)
Potassium: 4.1 mmol/L (ref 3.5–5.2)
Sodium: 144 mmol/L (ref 134–144)
Total Protein: 6.9 g/dL (ref 6.0–8.5)

## 2019-09-07 LAB — HEMOGLOBIN A1C
Est. average glucose Bld gHb Est-mCnc: 114 mg/dL
Hgb A1c MFr Bld: 5.6 % (ref 4.8–5.6)

## 2019-09-07 MED ORDER — AMLODIPINE BESYLATE 10 MG PO TABS: 10.0000 mg | ORAL_TABLET | Freq: Every day | ORAL | 3 refills | Status: DC

## 2019-09-07 MED ORDER — LOVASTATIN 40 MG PO TABS: 40.0000 mg | ORAL_TABLET | Freq: Every day | ORAL | 3 refills | Status: DC

## 2019-09-07 MED ORDER — DICLOFENAC SODIUM 50 MG PO TBEC: DELAYED_RELEASE_TABLET | ORAL | 1 refills | Status: DC

## 2019-09-07 MED ORDER — HYDROCHLOROTHIAZIDE 12.5 MG PO CAPS: 12.5000 mg | ORAL_CAPSULE | Freq: Every day | ORAL | 3 refills | Status: DC

## 2019-09-07 MED ORDER — NORTRIPTYLINE HCL 25 MG PO CAPS: 25.0000 mg | ORAL_CAPSULE | Freq: Every day | ORAL | 1 refills | Status: DC

## 2019-09-07 MED ORDER — METOPROLOL SUCCINATE ER 25 MG PO TB24: 25.0000 mg | ORAL_TABLET | Freq: Every day | ORAL | 3 refills | Status: DC

## 2019-09-07 NOTE — Patient Instructions (Signed)
Nortriptyline capsules What is this medicine? NORTRIPTYLINE (nor TRIP ti leen) is used to treat depression. This medicine may be used for other purposes; ask your health care provider or pharmacist if you have questions. COMMON BRAND NAME(S): Aventyl, Pamelor What should I tell my health care provider before I take this medicine? They need to know if you have any of these conditions:  bipolar disorder  Brugada syndrome  difficulty passing urine  glaucoma  heart disease  if you drink alcohol  liver disease  schizophrenia  seizures  suicidal thoughts, plans or attempt; a previous suicide attempt by you or a family member  thyroid disease  an unusual or allergic reaction to nortriptyline, other tricyclic antidepressants, other medicines, foods, dyes, or preservatives  pregnant or trying to get pregnant  breast-feeding How should I use this medicine? Take this medicine by mouth with a glass of water. Follow the directions on the prescription label. Take your doses at regular intervals. Do not take it more often than directed. Do not stop taking this medicine suddenly except upon the advice of your doctor. Stopping this medicine too quickly may cause serious side effects or your condition may worsen. A special MedGuide will be given to you by the pharmacist with each prescription and refill. Be sure to read this information carefully each time. Talk to your pediatrician regarding the use of this medicine in children. Special care may be needed. Overdosage: If you think you have taken too much of this medicine contact a poison control center or emergency room at once. NOTE: This medicine is only for you. Do not share this medicine with others. What if I miss a dose? If you miss a dose, take it as soon as you can. If it is almost time for your next dose, take only that dose. Do not take double or extra doses. What may interact with this medicine? Do not take this medicine with  any of the following medications:  cisapride  dronedarone  linezolid  MAOIs like Carbex, Eldepryl, Marplan, Nardil, and Parnate  methylene blue (injected into a vein)  pimozide  thioridazine This medicine may also interact with the following medications:  alcohol  antihistamines for allergy, cough, and cold  atropine  certain medicines for bladder problems like oxybutynin, tolterodine  certain medicines for depression like amitriptyline, fluoxetine, sertraline  certain medicines for Parkinson's disease like benztropine, trihexyphenidyl  certain medicines for stomach problems like dicyclomine, hyoscyamine  certain medicines for travel sickness like scopolamine  chlorpropamide  cimetidine  ipratropium  other medicines that prolong the QT interval (an abnormal heart rhythm) like dofetilide  other medicines that can cause serotonin syndrome like St. John's Wort, fentanyl, lithium, tramadol, tryptophan, buspirone, and some medicines for headaches like sumatriptan or rizatriptan  quinidine  reserpine  thyroid medicine This list may not describe all possible interactions. Give your health care provider a list of all the medicines, herbs, non-prescription drugs, or dietary supplements you use. Also tell them if you smoke, drink alcohol, or use illegal drugs. Some items may interact with your medicine. What should I watch for while using this medicine? Tell your doctor if your symptoms do not get better or if they get worse. Visit your doctor or health care professional for regular checks on your progress. Because it may take several weeks to see the full effects of this medicine, it is important to continue your treatment as prescribed by your doctor. Patients and their families should watch out for new or worsening thoughts   of suicide or depression. Also watch out for sudden changes in feelings such as feeling anxious, agitated, panicky, irritable, hostile, aggressive,  impulsive, severely restless, overly excited and hyperactive, or not being able to sleep. If this happens, especially at the beginning of treatment or after a change in dose, call your health care professional. You may get drowsy or dizzy. Do not drive, use machinery, or do anything that needs mental alertness until you know how this medicine affects you. Do not stand or sit up quickly, especially if you are an older patient. This reduces the risk of dizzy or fainting spells. Alcohol may interfere with the effect of this medicine. Avoid alcoholic drinks. Do not treat yourself for coughs, colds, or allergies without asking your doctor or health care professional for advice. Some ingredients can increase possible side effects. Your mouth may get dry. Chewing sugarless gum or sucking hard candy, and drinking plenty of water may help. Contact your doctor if the problem does not go away or is severe. This medicine may cause dry eyes and blurred vision. If you wear contact lenses you may feel some discomfort. Lubricating drops may help. See your eye doctor if the problem does not go away or is severe. This medicine can cause constipation. Try to have a bowel movement at least every 2 to 3 days. If you do not have a bowel movement for 3 days, call your doctor or health care professional. This medicine can make you more sensitive to the sun. Keep out of the sun. If you cannot avoid being in the sun, wear protective clothing and use sunscreen. Do not use sun lamps or tanning beds/booths. What side effects may I notice from receiving this medicine? Side effects that you should report to your doctor or health care professional as soon as possible:  allergic reactions like skin rash, itching or hives, swelling of the face, lips, or tongue  anxious  breathing problems  changes in vision  confusion  elevated mood, decreased need for sleep, racing thoughts, impulsive behavior  eye pain  fast, irregular  heartbeat  feeling faint or lightheaded, falls  feeling agitated, angry, or irritable  fever with increased sweating  hallucination, loss of contact with reality  seizures  stiff muscles  suicidal thoughts or other mood changes  tingling, pain, or numbness in the feet or hands  trouble passing urine or change in the amount of urine  trouble sleeping  unusually weak or tired  vomiting  yellowing of the eyes or skin Side effects that usually do not require medical attention (report to your doctor or health care professional if they continue or are bothersome):  change in sex drive or performance  change in appetite or weight  constipation  dizziness  dry mouth  nausea  tired  tremors  upset stomach This list may not describe all possible side effects. Call your doctor for medical advice about side effects. You may report side effects to FDA at 1-800-FDA-1088. Where should I keep my medicine? Keep out of the reach of children. Store at room temperature between 15 and 30 degrees C (59 and 86 degrees F). Keep container tightly closed. Throw away any unused medicine after the expiration date. NOTE: This sheet is a summary. It may not cover all possible information. If you have questions about this medicine, talk to your doctor, pharmacist, or health care provider.  2020 Elsevier/Gold Standard (2018-08-31 13:24:58)  

## 2019-09-07 NOTE — Progress Notes (Signed)
Patient: Lauren Manning, Female    DOB: 03/25/1936, 83 y.o.   MRN: [id number] Visit Date: 09/07/2019  Today's Provider: [name], PA-C   Chief Complaint  Patient presents with  . Medicare Wellness   Subjective:     Annual wellness visit Lauren Manning is a 83 y.o. female. She feels well. She reports exercising . She reports she is sleeping poorly.  ----------------------------------------------------------- Wt Readings from Last 3 Encounters:  09/07/19 139 lb 3.2 oz (63.1 kg)  05/03/19 137 lb 3.2 oz (62.2 kg)  08/02/18 138 lb 1.6 oz (62.6 kg)   BP Readings from Last 3 Encounters:  09/07/19 (!) 158/75  05/03/19 (!) 166/78  08/02/18 (!) 147/72    Review of Systems  Constitutional: Negative.   HENT: Negative.   Eyes: Negative.   Respiratory: Positive for apnea.   Cardiovascular: Negative.   Gastrointestinal: Positive for diarrhea.  Endocrine: Negative.   Genitourinary: Negative.   Musculoskeletal: Negative.   Skin: Negative.   Allergic/Immunologic: Negative.   Neurological: Negative.   Hematological: Negative.   Psychiatric/Behavioral: Positive for sleep disturbance.    Social History   Socioeconomic History  . Marital status: Single    Spouse name: Not on file  . Number of children: Not on file  . Years of education: Not on file  . Highest education level: Not on file  Occupational History  . Not on file  Tobacco Use  . Smoking status: Never Smoker  . Smokeless tobacco: Never Used  Substance and Sexual Activity  . Alcohol use: No  . Drug use: No  . Sexual activity: Not Currently    Birth control/protection: None  Other Topics Concern  . Not on file  Social History Narrative  . Not on file   Social Determinants of Health   Financial Resource Strain:   . Difficulty of Paying Living Expenses: Not on file  Food Insecurity:   . Worried About Charity fundraiser in the Last Year: Not on file  . Ran Out of Food in the Last Year: Not  on file  Transportation Needs:   . Lack of Transportation (Medical): Not on file  . Lack of Transportation (Non-Medical): Not on file  Physical Activity:   . Days of Exercise per Week: Not on file  . Minutes of Exercise per Session: Not on file  Stress:   . Feeling of Stress : Not on file  Social Connections:   . Frequency of Communication with Friends and Family: Not on file  . Frequency of Social Gatherings with Friends and Family: Not on file  . Attends Religious Services: Not on file  . Active Member of Clubs or Organizations: Not on file  . Attends Archivist Meetings: Not on file  . Marital Status: Not on file  Intimate Partner Violence:   . Fear of Current or Ex-Partner: Not on file  . Emotionally Abused: Not on file  . Physically Abused: Not on file  . Sexually Abused: Not on file    Past Medical History:  Diagnosis Date  . Alzheimer disease ([place])   . Arthritis    Gout  . Colon polyp   . Diverticulosis   . GERD (gastroesophageal reflux disease)   . HOH (hard of hearing)   . Hyperglycemia   . Hyperlipidemia   . Hypertension   . Memory impairment   . Multiple contusions of trunk   . Osteopenia      Patient Active  Problem List   Diagnosis Date Noted  . Vaginal pessary in situ 07/02/2017  . Hx of colonic polyps   . Hereditary hemochromatosis (HCC) 06/09/2016  . Vaginal atrophy 05/19/2016  . Baden-Walker grade 3 cystocele 05/19/2016  . Allergic reaction 11/21/2015  . Cataract 11/21/2015  . Cardiovascular degeneration (with mention of arteriosclerosis) 11/21/2015  . DD (diverticular disease) 11/21/2015  . Essential (primary) hypertension 11/21/2015  . Alzheimer's dementia (HCC) 11/21/2015  . Acid reflux 11/21/2015  . Arthritis urica 11/21/2015  . Calcium blood increased 11/21/2015  . Hypercholesteremia 11/21/2015  . Blood glucose elevated 11/21/2015  . Cannot sleep 11/21/2015  . LBP (low back pain) 11/21/2015  . Osteopenia 11/21/2015  . Blood  in feces 11/21/2015  . Cervical prolapse 11/21/2015  . Neck pain 12/12/2014  . Convulsion (HCC) 12/12/2014  . Hematuria 12/12/2014  . Limb weakness 12/20/2008    Past Surgical History:  Procedure Laterality Date  . ABDOMINAL HYSTERECTOMY    . CATARACT EXTRACTION W/PHACO Left 07/22/2016   Procedure: CATARACT EXTRACTION PHACO AND INTRAOCULAR LENS PLACEMENT (IOC);  Surgeon: Galen Manila, MD;  Location: [facility] ORS;  Service: Ophthalmology;  Laterality: Left;  [place] 43.1 AP% 20.2 CDE 8.71 Fluid pack lot # [id number]  . CHOLECYSTECTOMY  04/16/2004   Dr. Orlene Erm. Anterior and posterior colporrhaphy  . COLONOSCOPY WITH PROPOFOL N/A 07/01/2016   Procedure: COLONOSCOPY WITH PROPOFOL;  Surgeon: Midge Minium, MD;  Location: [facility] ENDOSCOPY;  Service: Endoscopy;  Laterality: N/A;  . history of colon polyps  1999  . PARTIAL HYSTERECTOMY    . TONSILLECTOMY    . TUBAL LIGATION      Her family history includes Breast cancer (age of onset: 9) in her daughter; Dementia in her mother; Prostate cancer in her father.   Current Outpatient Medications:  .  amLODipine (NORVASC) 10 MG tablet, Take 1 tablet (10 mg total) by mouth daily., Disp: 30 tablet, Rfl: 0 .  aspirin 81 MG tablet, Take 81 mg by mouth daily., Disp: , Rfl:  .  conjugated estrogens (PREMARIN) vaginal cream, Place 1 Applicatorful vaginally 2 (two) times a week. Insert 0.5 mg of cream vaginally twice weekly, Disp: 42.5 g, Rfl: 12 .  diclofenac (VOLTAREN) 50 MG EC tablet, TAKE 1 TABLET BY MOUTH TWICE (2) DAILY, Disp: 60 tablet, Rfl: 5 .  donepezil (ARICEPT) 10 MG tablet, TAKE 1 TABLET BY MOUTH DAILY AT BEDTIME, Disp: 90 tablet, Rfl: 3 .  hydrochlorothiazide (MICROZIDE) 12.5 MG capsule, TAKE 1 CAPSULE BY MOUTH ONCE DAILY, Disp: 90 capsule, Rfl: 1 .  lovastatin (MEVACOR) 40 MG tablet, TAKE 1 TABLET BY MOUTH DAILY AT BEDTIME, Disp: 90 tablet, Rfl: 1 .  metoprolol succinate (TOPROL-XL) 25 MG 24 hr tablet, TAKE 1 TABLET EVERY DAY, Disp: 30 tablet,  Rfl: 1  Patient Care Team: Margaretann Loveless, PA-C as PCP - General (Family Medicine) Hildred Laser, MD as Referring Physician (Obstetrics and Gynecology) Koleen Distance, PsyD as Consulting Physician (Psychology)    Objective:    Vitals: BP (!) 158/75 (BP Location: Left Arm, Patient Position: Sitting, Cuff Size: Normal)   Pulse (!) 57   Temp (!) 97.1 F (36.2 C) (Temporal)   Resp 16   Ht 4' 11.75" (1.518 m)   Wt 139 lb 3.2 oz (63.1 kg)   BMI 27.41 kg/m   Physical Exam Vitals reviewed.  Constitutional:      General: She is not in acute distress.    Appearance: Normal appearance. She is well-developed and normal weight. She is not ill-appearing or  diaphoretic.  HENT:     Head: Normocephalic and atraumatic.     Right Ear: Tympanic membrane, ear canal and external ear normal. There is impacted cerumen.     Left Ear: Tympanic membrane, ear canal and external ear normal. There is impacted cerumen.  Eyes:     General: No scleral icterus.       Right eye: No discharge.        Left eye: No discharge.     Extraocular Movements: Extraocular movements intact.     Conjunctiva/sclera: Conjunctivae normal.     Pupils: Pupils are equal, round, and reactive to light.  Neck:     Thyroid: No thyromegaly.     Vascular: No carotid bruit or JVD.     Trachea: No tracheal deviation.  Cardiovascular:     Rate and Rhythm: Normal rate and regular rhythm.     Pulses: Normal pulses.     Heart sounds: Normal heart sounds. No murmur. No friction rub. No gallop.   Pulmonary:     Effort: Pulmonary effort is normal. No respiratory distress.     Breath sounds: Normal breath sounds. No wheezing or rales.  Chest:     Chest wall: No tenderness.  Abdominal:     General: Abdomen is flat. Bowel sounds are normal. There is no distension.     Palpations: Abdomen is soft. There is no mass.     Tenderness: There is no abdominal tenderness. There is no guarding or rebound.  Musculoskeletal:         General: No tenderness. Normal range of motion.     Cervical back: Normal range of motion and neck supple.     Right lower leg: No edema.     Left lower leg: No edema.  Lymphadenopathy:     Cervical: No cervical adenopathy.  Skin:    General: Skin is warm and dry.     Capillary Refill: Capillary refill takes less than 2 seconds.     Findings: No rash.  Neurological:     General: No focal deficit present.     Mental Status: She is alert and oriented to person, place, and time. Mental status is at baseline.  Psychiatric:        Mood and Affect: Mood normal.        Behavior: Behavior normal.        Thought Content: Thought content normal.        Judgment: Judgment normal.     Activities of Daily Living In your present state of health, do you have any difficulty performing the following activities: 09/07/2019  Hearing? Y  Vision? N  Difficulty concentrating or making decisions? N  Walking or climbing stairs? N  Dressing or bathing? N  Doing errands, shopping? N  Some recent data might be hidden    Fall Risk Assessment Fall Risk  09/07/2019 03/19/2017 03/10/2016  Falls in the past year? 0 No No  Number falls in past yr: 0 - -  Injury with Fall? 0 - -  Follow up Falls evaluation completed - -     Depression Screen PHQ 2/9 Scores 09/07/2019 03/19/2017 03/19/2017 03/10/2016  PHQ - 2 Score 0 0 0 0  PHQ- 9 Score - 2 - -    6CIT Screen 03/19/2017  What Year? 0 points  What month? 0 points  What time? 0 points  Count back from 20 0 points  Months in reverse 2 points  Repeat phrase 6 points  Total Score 8  Assessment & Plan:     Annual Wellness Visit  Reviewed patient's Family Medical History Reviewed and updated list of patient's medical providers Assessment of cognitive impairment was done Assessed patient's functional ability Established a written schedule for health screening services Health Risk Assessent Completed and Reviewed  Exercise Activities and  Dietary recommendations Goals    . Exercise 2x per week      Recommend to start exercising. Pt agrees to take exercise class at living facility 2 days a week for 1 hour.        Immunization History  Administered Date(s) Administered  . Influenza, High Dose Seasonal PF 07/28/2017, 06/19/2018  . Influenza-Unspecified 06/22/2014  . Td 12/25/2005    Health Maintenance  Topic Date Due  . PNA vac Low Risk Adult (1 of 2 - PCV13) 02/28/2003  . TETANUS/TDAP  12/26/2015  . INFLUENZA VACCINE  04/23/2019  . COLONOSCOPY  07/01/2021  . DEXA SCAN  Completed     Discussed health benefits of physical activity, and encouraged her to engage in regular exercise appropriate for her age and condition.    1. Medicare annual wellness visit, subsequent Normal exam. Up to date on screenings and vaccinations. Labs from 09/06/19 were reviewed with patient and daughter in the office today.  2. Essential (primary) hypertension Stable. Diagnosis pulled for medication refill. Continue current medical treatment plan.  - hydrochlorothiazide (MICROZIDE) 12.5 MG capsule; Take 1 capsule (12.5 mg total) by mouth daily.  Dispense: 90 capsule; Refill: 3 - metoprolol succinate (TOPROL-XL) 25 MG 24 hr tablet; Take 1 tablet (25 mg total) by mouth daily.  Dispense: 90 tablet; Refill: 3  3. Late onset Alzheimer's disease without behavioral disturbance (HCC) Having issues with sleep most likely secondary to dementia. Will give Nortriptyline as below. Continue Donepezil . Referral placed back to Neuropsych for her annual evaluation. She was to have annually, but last exam was done in 2018. Referral for recheck.  - Ambulatory referral to Neurology - nortriptyline (PAMELOR) 25 MG capsule; Take 1 capsule (25 mg total) by mouth at bedtime.  Dispense: 90 capsule; Refill: 1  4. Primary insomnia See above medical treatment plan. Call if not effective or if side effects develop.  - nortriptyline (PAMELOR) 25 MG capsule; Take  1 capsule (25 mg total) by mouth at bedtime.  Dispense: 90 capsule; Refill: 1  5. Essential hypertension Stable. Diagnosis pulled for medication refill. Continue current medical treatment plan. - amLODipine (NORVASC) 10 MG tablet; Take 1 tablet (10 mg total) by mouth daily.  Dispense: 90 tablet; Refill: 3  6. Arthritis Stable. Diagnosis pulled for medication refill. Continue current medical treatment plan. - diclofenac (VOLTAREN) 50 MG EC tablet; TAKE 1 TABLET BY MOUTH TWICE (2) DAILY  Dispense: 180 tablet; Refill: 1  7. Hypercholesteremia Stable. Diagnosis pulled for medication refill. Continue current medical treatment plan. - lovastatin (MEVACOR) 40 MG tablet; Take 1 tablet (40 mg total) by mouth at bedtime.  Dispense: 90 tablet; Refill: 3  8. Bilateral impacted cerumen Ear lavage successful bilaterally. TM normal.  - Ear Lavage  ------------------------------------------------------------------------------------------------------------    Margaretann Loveless, PA-C  [facility]

## 2019-09-09 ENCOUNTER — Encounter: Payer: Self-pay | Admitting: Neurology

## 2019-09-26 ENCOUNTER — Telehealth: Payer: Self-pay

## 2019-09-26 NOTE — Telephone Encounter (Signed)
Copied from CRM [id number]. Topic: General - Inquiry >> Sep 26, 2019 11:56 AM Baldo Daub L wrote: Reason for CRM:   Pt's daughter would like to get pt's most updated medication list and know how pt is supposed to be taking medication.

## 2019-09-29 NOTE — Telephone Encounter (Signed)
Tried [employer]. Left message to call back. When she returns call, please advise her that the easiest way for her to get a copy of patient's medication list is to log onto [facility] and view/ print out the list. We can resend a link (either email or text) for her to sign up for [facility]. OK for [facility] to advise of this message.

## 2019-10-04 ENCOUNTER — Telehealth: Payer: Self-pay | Admitting: Physician Assistant

## 2019-10-04 DIAGNOSIS — K591 Functional diarrhea: Secondary | ICD-10-CM

## 2019-10-04 MED ORDER — DICYCLOMINE HCL 10 MG PO CAPS: 10.0000 mg | ORAL_CAPSULE | Freq: Three times a day (TID) | ORAL | 1 refills | Status: DC

## 2019-10-04 NOTE — Telephone Encounter (Signed)
Called and spoke with the patient's daughter and she stated that the patient is agreeable to trying the Bentyl and understands the instructions and if it continues that a referral back to Dr. Servando Snare will need to be sent. She would like the prescription sent to [facility]. She gave verbal understanding.

## 2019-10-04 NOTE — Telephone Encounter (Signed)
I do not see where Antony Contras has recently addressed this in her notes. I think if the diarrhea continues to worse she should schedule a visit, which can be virtual, to reassess and figure out a plan.

## 2019-10-04 NOTE — Telephone Encounter (Signed)
Patient's daughter refused to set up virtual visit because she says that the diarrhea has been an ongoing issue for a while so she feels that their is no need to do a visit.

## 2019-10-04 NOTE — Telephone Encounter (Signed)
As this is a chronic issue and patient has declined a visit for worsening, I will defer to her PCP when she is back in the office which will be on Thursday. Her PCP may want to evaluate her with an office visit at her discretion.

## 2019-10-04 NOTE — Telephone Encounter (Signed)
Has been discussed previously and patient has always been able to treat with imodium OTC.   We can try Bentyl but it has to be taken with each meal and at bedtime. If this does not work she may need to see Dr. Servando Snare again to make sure nothing is being missed.

## 2019-10-04 NOTE — Telephone Encounter (Signed)
Pt's daughter calling.  States that pt has been seen for diarrhea by Joycelyn Man, but has only taken OTC medication.  Wants to know if there is a RX that she can have as diarrhea continues to get worse.  Pt's daughter states pt uses delivery service with  [facility] - [place], [place] - [address] Phone:  [phone]  Fax:  [phone]

## 2019-10-04 NOTE — Addendum Note (Signed)
Addended by: Margaretann Loveless on: 10/04/2019 03:05 PM   Modules accepted: Orders

## 2019-11-23 ENCOUNTER — Encounter: Payer: Self-pay | Admitting: Neurology

## 2019-11-23 ENCOUNTER — Other Ambulatory Visit: Payer: Self-pay

## 2019-11-23 ENCOUNTER — Ambulatory Visit: Payer: Medicare HMO | Admitting: Neurology

## 2019-11-23 VITALS — BP 158/87 | HR 61 | Ht 62.0 in | Wt 137.0 lb

## 2019-11-23 DIAGNOSIS — G301 Alzheimer's disease with late onset: Secondary | ICD-10-CM | POA: Diagnosis not present

## 2019-11-23 DIAGNOSIS — F0281 Dementia in other diseases classified elsewhere with behavioral disturbance: Secondary | ICD-10-CM | POA: Diagnosis not present

## 2019-11-23 DIAGNOSIS — F02818 Dementia in other diseases classified elsewhere, unspecified severity, with other behavioral disturbance: Secondary | ICD-10-CM

## 2019-11-23 NOTE — Progress Notes (Signed)
NEUROLOGY CONSULTATION NOTE  EDUARDO WURTH MRN: [id number] DOB: [birth year]  Referring provider: Fenton Malling, PA-C Primary care provider: Fenton Malling, PA-C  Reason for consult:  dementia  Thank you for your kind referral of Lauren Manning for consultation of the above symptoms. Although her history is well known to you, please allow me to reiterate it for the purpose of our medical record. The patient was accompanied to the clinic by her daughter Lauren Manning who also provides collateral information. Records and images were personally reviewed where available.   HISTORY OF PRESENT ILLNESS: This is an 84 year old right-handed woman with a history of hypertension, hyperlipidemia, Alzheimer's disease, presenting for evaluation of dementia. Her daughter Lauren Manning is present to provide additional information. Family started noticing memory changes several years ago. She underwent Neuropsychological testing with Dr. Si Raider in April 2018 with a diagnosis of Mild dementia most likely secondary to Alzheimer's disease, without behavioral disturbance, Mild depression. Cognitive profile is suggestive of medial-temporal lobe involvement. She had been on Donepezil 5mg  daily, dose was increased to 10mg  daily. It was noted that she should continue to receive assistance with complex ADLs, and would be well suited for a continuing care retirement community or assisted living where she could have increased oversight of medications and meals. She continues to live alone in a senior living apartment complex where her neighbors look out for her. She manages her own medications and denies missing any doses. Lauren Manning works close by and comes for lunch sometimes, and sees her pillbox would be empty, but states she did not take her medications this morning. One time, a neighbor called, they apparently called EMS because she went to her neighbor saying she thinks she took her medication twice, EMS checked her BP and she  seemed fine. She stopped driving in [id number]. Lauren Manning took over finances in 2014. She denies misplacing things, however Lauren Manning reports that she could not find her cane or keys this morning. She cannot tell Lauren Manning what she ate, and thinks she is eating the same things repeatedly. She has had some weight loss. She denies leaving the stove on. She is independent with dressing and bathing, she is "always cleaning," and house is well-kept. Lauren Manning has noticed that her mood has changed quite a bit, she gets irritated easily. She used to have birds and talked about strangling them because they were so noisy. They got rid of the birds this week. She cusses a lot more than before. No paranoia or hallucinations. She states her mood is okay. She has difficulties with sleep initiation and was prescribed nortriptyline, but they don't think she filled the prescription.   She denies any headaches, dizziness, diplopia, dysarthria, dysphagia, neck/back pain, focal numbness/tingling/weakness, bowel dysfunction. No anosmia, tremors. She has had diarrhea for "years" occurring 2-3 times a week. She had a bout this morning. Her daughter reports diverticulitis in the past, and that she still eats dairy because she forgets that she should avoid it. She reports a fall this morning, she lost her balance. No injuries. She usually ambulates with a cane. Her mother had Alzheimer's disease. No history of significant head injuries. No alcohol use.  I personally reviewed head CT without contrast done 11/2017 which did not show any acute changes. There was moderate degree of periventricular and subcortical white matter hypoattenuation compatible with chronic microvascular ischemia. Mild atrophy with ventricular sulcal prominence.  Laboratory Data: Lab Results  Component Value Date   TSH 2.980 11/23/2015     PAST MEDICAL  HISTORY: Past Medical History:  Diagnosis Date  . Alzheimer disease (HCC)   . Arthritis    Gout  . Colon polyp     . Diverticulosis   . GERD (gastroesophageal reflux disease)   . HOH (hard of hearing)   . Hyperglycemia   . Hyperlipidemia   . Hypertension   . Memory impairment   . Multiple contusions of trunk   . Osteopenia     PAST SURGICAL HISTORY: Past Surgical History:  Procedure Laterality Date  . ABDOMINAL HYSTERECTOMY    . CATARACT EXTRACTION W/PHACO Left 07/22/2016   Procedure: CATARACT EXTRACTION PHACO AND INTRAOCULAR LENS PLACEMENT (IOC);  Surgeon: Galen Manila, MD;  Location: [facility] ORS;  Service: Ophthalmology;  Laterality: Left;  [place] 43.1 AP% 20.2 CDE 8.71 Fluid pack lot # [id number]  . CHOLECYSTECTOMY  04/16/2004   Dr. Orlene Erm. Anterior and posterior colporrhaphy  . COLONOSCOPY WITH PROPOFOL N/A 07/01/2016   Procedure: COLONOSCOPY WITH PROPOFOL;  Surgeon: Midge Minium, MD;  Location: [facility] ENDOSCOPY;  Service: Endoscopy;  Laterality: N/A;  . history of colon polyps  1999  . PARTIAL HYSTERECTOMY    . TONSILLECTOMY    . TUBAL LIGATION      MEDICATIONS: Current Outpatient Medications on File Prior to Visit  Medication Sig Dispense Refill  . amLODipine (NORVASC) 10 MG tablet Take 1 tablet (10 mg total) by mouth daily. 90 tablet 3  . aspirin 81 MG tablet Take 81 mg by mouth daily.    Marland Kitchen conjugated estrogens (PREMARIN) vaginal cream Place 1 Applicatorful vaginally 2 (two) times a week. Insert 0.5 mg of cream vaginally twice weekly 42.5 g 12  . diclofenac (VOLTAREN) 50 MG EC tablet TAKE 1 TABLET BY MOUTH TWICE (2) DAILY 180 tablet 1  . dicyclomine (BENTYL) 10 MG capsule Take 1 capsule (10 mg total) by mouth 4 (four) times daily -  before meals and at bedtime. 120 capsule 1  . donepezil (ARICEPT) 10 MG tablet TAKE 1 TABLET BY MOUTH DAILY AT BEDTIME 90 tablet 3  . hydrochlorothiazide (MICROZIDE) 12.5 MG capsule Take 1 capsule (12.5 mg total) by mouth daily. 90 capsule 3  . lovastatin (MEVACOR) 40 MG tablet Take 1 tablet (40 mg total) by mouth at bedtime. 90 tablet 3  . metoprolol  succinate (TOPROL-XL) 25 MG 24 hr tablet Take 1 tablet (25 mg total) by mouth daily. 90 tablet 3  . nortriptyline (PAMELOR) 25 MG capsule Take 1 capsule (25 mg total) by mouth at bedtime. 90 capsule 1   No current facility-administered medications on file prior to visit.    ALLERGIES: Allergies  Allergen Reactions  . Enalapril Other (See Comments)    Pt. Denies this allergy Other reaction(s): Unknown  . Penicillins     Has patient had a PCN reaction causing immediate rash, facial/tongue/throat swelling, SOB or lightheadedness with hypotension:unsure Has patient had a PCN reaction causing severe rash involving mucus membranes or skin necrosis:unsure Has patient had a PCN reaction that required hospitalization:unsure Has patient had a PCN reaction occurring within the last 10 years:unsure If all of the above answers are "NO", then may proceed with Cephalosporin use.  Other reaction(s): Unknown    FAMILY HISTORY: Family History  Problem Relation Age of Onset  . Dementia Mother   . Prostate cancer Father   . Breast cancer Daughter 44       breast cancer    SOCIAL HISTORY: Social History   Socioeconomic History  . Marital status: Single    Spouse name: Not  on file  . Number of children: Not on file  . Years of education: Not on file  . Highest education level: Not on file  Occupational History  . Not on file  Tobacco Use  . Smoking status: Never Smoker  . Smokeless tobacco: Never Used  Substance and Sexual Activity  . Alcohol use: No  . Drug use: No  . Sexual activity: Not Currently    Birth control/protection: None  Other Topics Concern  . Not on file  Social History Narrative  . Not on file   Social Determinants of Health   Financial Resource Strain:   . Difficulty of Paying Living Expenses: Not on file  Food Insecurity:   . Worried About Programme researcher, broadcasting/film/video in the Last Year: Not on file  . Ran Out of Food in the Last Year: Not on file  Transportation Needs:    . Lack of Transportation (Medical): Not on file  . Lack of Transportation (Non-Medical): Not on file  Physical Activity:   . Days of Exercise per Week: Not on file  . Minutes of Exercise per Session: Not on file  Stress:   . Feeling of Stress : Not on file  Social Connections:   . Frequency of Communication with Friends and Family: Not on file  . Frequency of Social Gatherings with Friends and Family: Not on file  . Attends Religious Services: Not on file  . Active Member of Clubs or Organizations: Not on file  . Attends Banker Meetings: Not on file  . Marital Status: Not on file  Intimate Partner Violence:   . Fear of Current or Ex-Partner: Not on file  . Emotionally Abused: Not on file  . Physically Abused: Not on file  . Sexually Abused: Not on file    REVIEW OF SYSTEMS: Constitutional: No fevers, chills, or sweats, no generalized fatigue, change in appetite Eyes: No visual changes, double vision, eye pain Ear, nose and throat: No hearing loss, ear pain, nasal congestion, sore throat Cardiovascular: No chest pain, palpitations Respiratory:  No shortness of breath at rest or with exertion, wheezes GastrointestinaI: No nausea, vomiting, diarrhea, abdominal pain, fecal incontinence Genitourinary:  No dysuria, urinary retention or frequency Musculoskeletal:  No neck pain, back pain Integumentary: No rash, pruritus, skin lesions Neurological: as above Psychiatric: No depression, insomnia, anxiety Endocrine: No palpitations, fatigue, diaphoresis, mood swings, change in appetite, change in weight, increased thirst Hematologic/Lymphatic:  No anemia, purpura, petechiae. Allergic/Immunologic: no itchy/runny eyes, nasal congestion, recent allergic reactions, rashes  PHYSICAL EXAM: Vitals:   11/23/19 0911  BP: (!) 158/87  Pulse: 61  SpO2: 98%   General: No acute distress Head:  Normocephalic/atraumatic Skin/Extremities: No rash, no edema Neurological  Exam: Mental status: alert and oriented to person, state. Says it is Friday, Norberta Keens (it is Wed, 2021). No dysarthria or aphasia, Fund of knowledge is reduced.  Recent and remote memory are impaired. Attention and concentration are reduced. SLUMS score 14/30.  [employer] Mental Exam 11/23/2019  Weekday Correct 0  Current year 0  What state are we in? 1  Amount spent 1  Amount left 0  # of Animals 2  5 objects recall 1  Number series 0  Hour markers 1  Time correct 2  Placed X in triangle correctly 1  Largest Figure 1  Name of female 2  Date back to work 0  Type of work 2  State she lived in 0  Total score 14   Cranial  nerves: CN I: not tested CN II: pupils equal, round and reactive to light, visual fields intact CN III, IV, VI:  full range of motion, no nystagmus, no ptosis CN V: facial sensation intact CN VII: upper and lower face symmetric CN VIII: hearing intact to conversation, hard of hearing Bulk & Tone: normal, no fasciculations. Motor: 5/5 throughout with no pronator drift. Sensation: intact to light touch Deep Tendon Reflexes: brisk +2 throughout, no ankle clonus, negative Hoffman sign Cerebellar: no incoordination on finger to nose testing Gait: slow and cautious with cane, no ataxia Tremor: none  IMPRESSION: This is an 84 year old right-handed woman with a history of hypertension, hyperlipidemia, Alzheimer's disease, referred to our office for repeat Neuropsychological testing. She was evaluated in the neurology clinic today, SLUMS score 14/30. We discussed the diagnosis of Alzheimer's disease, including prognosis and needing more supervision with medications and meals, as previously recommended. She feels she is doing fine, I discussed the importance of advanced care planning. She does not want to go to assisted living. Consider increasing supervision at home with home health aide. She has been having diarrhea for several years, this may be due to Donepezil, we  discussed stopping Donepezil and monitoring for any change with diarrhea off medication. Her daughter will call our office for an update. Would opt for starting Memantine if she is having diarrhea. Repeat Neuropsychological testing will be scheduled. She does not drive. Follow-up in 6 months, they know to call for any changes.   Thank you for allowing me to participate in the care of this patient. Please do not hesitate to call for any questions or concerns.   Patrcia Dolly, M.D.  CC: Joycelyn Man, PA-C

## 2019-11-23 NOTE — Patient Instructions (Signed)
1. Stop the Donepezil tablet. Monitor if there is improvement in diarrhea and update our office in 2 weeks. We will plan to start a different memory medication at that point.  2. Schedule Neurocognitive testing  3. Follow-up in 6 months, call for any changes   FALL PRECAUTIONS: Be cautious when walking. Scan the area for obstacles that may increase the risk of trips and falls. When getting up in the mornings, sit up at the edge of the bed for a few minutes before getting out of bed. Consider elevating the bed at the head end to avoid drop of blood pressure when getting up. Walk always in a well-lit room (use night lights in the walls). Avoid area rugs or power cords from appliances in the middle of the walkways. Use a walker or a cane if necessary and consider physical therapy for balance exercise. Get your eyesight checked regularly.   HOME SAFETY: Consider the safety of the kitchen when operating appliances like stoves, microwave oven, and blender. Consider having supervision and share cooking responsibilities until no longer able to participate in those. Accidents with firearms and other hazards in the house should be identified and addressed as well.   ABILITY TO BE LEFT ALONE: If patient is unable to contact 911 operator, consider using LifeLine, or when the need is there, arrange for someone to stay with patients. Smoking is a fire hazard, consider supervision or cessation. Risk of wandering should be assessed by caregiver and if detected at any point, supervision and safe proof recommendations should be instituted.  MEDICATION SUPERVISION: Inability to self-administer medication needs to be constantly addressed. Implement a mechanism to ensure safe administration of the medications.  RECOMMENDATIONS FOR ALL PATIENTS WITH MEMORY PROBLEMS: 1. Continue to exercise (Recommend 30 minutes of walking everyday, or 3 hours every week) 2. Increase social interactions - continue going to [place] and  enjoy social gatherings with friends and family 3. Eat healthy, avoid fried foods and eat more fruits and vegetables 4. Maintain adequate blood pressure, blood sugar, and blood cholesterol level. Reducing the risk of stroke and cardiovascular disease also helps promoting better memory. 5. Avoid stressful situations. Live a simple life and avoid aggravations. Organize your time and prepare for the next day in anticipation. 6. Sleep well, avoid any interruptions of sleep and avoid any distractions in the bedroom that may interfere with adequate sleep quality 7. Avoid sugar, avoid sweets as there is a strong link between excessive sugar intake, diabetes, and cognitive impairment The Mediterranean diet has been shown to help patients reduce the risk of progressive memory disorders and reduces cardiovascular risk. This includes eating fish, eat fruits and green leafy vegetables, nuts like almonds and hazelnuts, walnuts, and also use olive oil. Avoid fast foods and fried foods as much as possible. Avoid sweets and sugar as sugar use has been linked to worsening of memory function.  There is always a concern of gradual progression of memory problems. If this is the case, then we may need to adjust level of care according to patient needs. Support, both to the patient and caregiver, should then be put into place.

## 2019-11-30 ENCOUNTER — Telehealth: Payer: Self-pay | Admitting: Physician Assistant

## 2019-11-30 ENCOUNTER — Telehealth: Payer: Self-pay

## 2019-11-30 DIAGNOSIS — G479 Sleep disorder, unspecified: Secondary | ICD-10-CM

## 2019-11-30 DIAGNOSIS — K591 Functional diarrhea: Secondary | ICD-10-CM

## 2019-11-30 DIAGNOSIS — K580 Irritable bowel syndrome with diarrhea: Secondary | ICD-10-CM

## 2019-11-30 NOTE — Telephone Encounter (Signed)
LMTCB

## 2019-11-30 NOTE — Telephone Encounter (Signed)
There is another message going around about this patient and medication.  Looks like her neurologist stopped it, but she states she is on it???

## 2019-11-30 NOTE — Telephone Encounter (Signed)
Pt.'s daughter reports her neurologist has stopped her Bentyl for now. Daughter reports she was supposed to start Nortriptyline for sleep, but this was never sent to pharmacy. Asking if pt. Could start it now for sleep. Please advise daughter.

## 2019-11-30 NOTE — Telephone Encounter (Signed)
Please advise 

## 2019-11-30 NOTE — Telephone Encounter (Signed)
Pt wants to know why medication isn't being refilled.  States that she does in fact take this medication.

## 2019-12-01 MED ORDER — NORTRIPTYLINE HCL 25 MG PO CAPS: 25.0000 mg | ORAL_CAPSULE | Freq: Every day | ORAL | 1 refills | Status: DC

## 2019-12-01 NOTE — Addendum Note (Signed)
Addended by: Margaretann Loveless on: 12/01/2019 08:14 AM   Modules accepted: Orders

## 2019-12-01 NOTE — Addendum Note (Signed)
Addended by: Marjie Skiff on: 12/01/2019 06:38 PM   Modules accepted: Orders

## 2019-12-01 NOTE — Addendum Note (Signed)
Addended by: Margaretann Loveless on: 12/01/2019 07:17 AM   Modules accepted: Orders

## 2019-12-01 NOTE — Telephone Encounter (Signed)
Can we call in nortriptyline 25mg  1 capsule q hs #90 1 refill to . It wont let me e-scribe.

## 2019-12-02 NOTE — Telephone Encounter (Signed)
Prescription called into the pharmacy.

## 2019-12-28 ENCOUNTER — Other Ambulatory Visit: Payer: Self-pay

## 2019-12-28 ENCOUNTER — Telehealth: Payer: Self-pay

## 2019-12-28 MED ORDER — RIVASTIGMINE TARTRATE 1.5 MG PO CAPS: 1.5000 mg | ORAL_CAPSULE | Freq: Two times a day (BID) | ORAL | 1 refills | Status: DC

## 2019-12-28 NOTE — Telephone Encounter (Signed)
Pt daughter called stated that after stopping the Aricept Mrs Gionfriddo's diarrhea is better asking what new medication you would like to start her on?

## 2019-12-28 NOTE — Telephone Encounter (Signed)
Rx sent for rivastigmine 1.5mg  BID, pt daughter called back no answer voice mail left for them to call back

## 2019-12-28 NOTE — Telephone Encounter (Signed)
Would start rivastigmine 1.5mg  BID, pls let her know that side effects may be similar to Aricept, but overall if patients do not tolerate Aricept, they tolerate this better, let us know if any issues. Pls send in Rx with refills until her next appt, thanks

## 2019-12-30 NOTE — Telephone Encounter (Signed)
This encounter was created in error - please disregard.

## 2019-12-30 NOTE — Telephone Encounter (Signed)
Spoke with patient daughter and advised about medication.

## 2020-03-19 ENCOUNTER — Ambulatory Visit
Admission: RE | Admit: 2020-03-19 | Discharge: 2020-03-19 | Disposition: A | Discharge status: Home / Self Care | Payer: Medicare (Managed Care) | Attending: Physician Assistant | Admitting: Physician Assistant

## 2020-03-19 ENCOUNTER — Encounter: Payer: Self-pay | Admitting: Physician Assistant

## 2020-03-19 ENCOUNTER — Ambulatory Visit
Admission: RE | Admit: 2020-03-19 | Discharge: 2020-03-19 | Disposition: A | Discharge status: Home / Self Care | Payer: Medicare (Managed Care) | Source: Ambulatory Visit | Attending: Physician Assistant | Admitting: Physician Assistant

## 2020-03-19 ENCOUNTER — Other Ambulatory Visit: Payer: Self-pay

## 2020-03-19 ENCOUNTER — Ambulatory Visit (INDEPENDENT_AMBULATORY_CARE_PROVIDER_SITE_OTHER): Payer: Medicare (Managed Care) | Admitting: Physician Assistant

## 2020-03-19 VITALS — BP 187/83 | HR 62 | Temp 97.0°F | Resp 16 | Wt 135.0 lb

## 2020-03-19 DIAGNOSIS — M79604 Pain in right leg: Secondary | ICD-10-CM | POA: Diagnosis present

## 2020-03-19 DIAGNOSIS — R29898 Other symptoms and signs involving the musculoskeletal system: Secondary | ICD-10-CM

## 2020-03-19 DIAGNOSIS — M5136 Other intervertebral disc degeneration, lumbar region: Secondary | ICD-10-CM | POA: Insufficient documentation

## 2020-03-19 DIAGNOSIS — I10 Essential (primary) hypertension: Secondary | ICD-10-CM

## 2020-03-19 DIAGNOSIS — Y929 Unspecified place or not applicable: Secondary | ICD-10-CM | POA: Diagnosis not present

## 2020-03-19 DIAGNOSIS — M1711 Unilateral primary osteoarthritis, right knee: Secondary | ICD-10-CM | POA: Diagnosis not present

## 2020-03-19 DIAGNOSIS — W19XXXA Unspecified fall, initial encounter: Secondary | ICD-10-CM | POA: Diagnosis not present

## 2020-03-19 DIAGNOSIS — G301 Alzheimer's disease with late onset: Secondary | ICD-10-CM

## 2020-03-19 DIAGNOSIS — F0280 Dementia in other diseases classified elsewhere without behavioral disturbance: Secondary | ICD-10-CM

## 2020-03-19 DIAGNOSIS — Y939 Activity, unspecified: Secondary | ICD-10-CM | POA: Insufficient documentation

## 2020-03-19 DIAGNOSIS — K591 Functional diarrhea: Secondary | ICD-10-CM

## 2020-03-19 NOTE — Patient Instructions (Signed)

## 2020-03-19 NOTE — Progress Notes (Signed)
Established patient visit   Patient: Lauren Manning   DOB: May 27, 1936   84 y.o. Female  MRN: [id number] Visit Date: 03/19/2020  Today's healthcare provider: Margaretann Loveless, PA-C   No chief complaint on file.  Subjective    HPI Patient here with right leg weakness. This has been happening for 2 months. She reports that is just an aching pain off and on. When it happens she is not able to put pressure on it without it feeling weak and sometimes makes her fall. She is ambulating with a cane currently. Daughter reports she may complain of her right leg once, sometimes twice, per week. Daughter reports that in March to April (around Easter) the patient fell backwards landing directly on her buttocks. Did not seek medical attention. Shortly after that time frame is when patient started complaining about her right leg.   Patient Active Problem List   Diagnosis Date Noted  . Vaginal pessary in situ 07/02/2017  . Hx of colonic polyps   . Hereditary hemochromatosis (HCC) 06/09/2016  . Vaginal atrophy 05/19/2016  . Baden-Walker grade 3 cystocele 05/19/2016  . Allergic reaction 11/21/2015  . Cataract 11/21/2015  . Cardiovascular degeneration (with mention of arteriosclerosis) 11/21/2015  . DD (diverticular disease) 11/21/2015  . Essential (primary) hypertension 11/21/2015  . Alzheimer's dementia (HCC) 11/21/2015  . Acid reflux 11/21/2015  . Arthritis urica 11/21/2015  . Calcium blood increased 11/21/2015  . Hypercholesteremia 11/21/2015  . Blood glucose elevated 11/21/2015  . Cannot sleep 11/21/2015  . LBP (low back pain) 11/21/2015  . Osteopenia 11/21/2015  . Blood in feces 11/21/2015  . Cervical prolapse 11/21/2015  . Neck pain 12/12/2014  . Convulsion (HCC) 12/12/2014  . Hematuria 12/12/2014  . Limb weakness 12/20/2008   Social History   Tobacco Use  . Smoking status: Never Smoker  . Smokeless tobacco: Never Used  Vaping Use  . Vaping Use: Never used  Substance Use  Topics  . Alcohol use: No  . Drug use: No       Medications: Outpatient Medications Prior to Visit  Medication Sig  . amLODipine (NORVASC) 10 MG tablet Take 1 tablet (10 mg total) by mouth daily.  Marland Kitchen aspirin 81 MG tablet Take 81 mg by mouth daily.  Marland Kitchen conjugated estrogens (PREMARIN) vaginal cream Place 1 Applicatorful vaginally 2 (two) times a week. Insert 0.5 mg of cream vaginally twice weekly  . diclofenac (VOLTAREN) 50 MG EC tablet TAKE 1 TABLET BY MOUTH TWICE (2) DAILY  . hydrochlorothiazide (MICROZIDE) 12.5 MG capsule Take 1 capsule (12.5 mg total) by mouth daily.  Marland Kitchen lovastatin (MEVACOR) 40 MG tablet Take 1 tablet (40 mg total) by mouth at bedtime.  . metoprolol succinate (TOPROL-XL) 25 MG 24 hr tablet Take 1 tablet (25 mg total) by mouth daily.  . nortriptyline (PAMELOR) 25 MG capsule Take 1 capsule (25 mg total) by mouth at bedtime.  . rivastigmine (EXELON) 1.5 MG capsule Take 1 capsule (1.5 mg total) by mouth 2 (two) times daily.   No facility-administered medications prior to visit.    Review of Systems  Eyes: Negative for visual disturbance.  Respiratory: Negative for chest tightness, shortness of breath and wheezing.   Cardiovascular: Negative for chest pain, palpitations and leg swelling.  Musculoskeletal: Positive for arthralgias and gait problem. Negative for joint swelling.  Neurological: Positive for weakness. Negative for dizziness, light-headedness and headaches.    Last CBC Lab Results  Component Value Date   WBC 7.8 09/06/2019  HGB 16.4 (H) 09/06/2019   HCT 49.2 (H) 09/06/2019   MCV 91 09/06/2019   MCH 30.5 09/06/2019   RDW 12.5 09/06/2019   PLT 272 09/06/2019      Objective    BP (!) 187/83 (BP Location: Left Arm, Patient Position: Sitting, Cuff Size: Normal)   Pulse 62   Temp (!) 97 F (36.1 C) (Temporal)   Resp 16   Wt 135 lb (61.2 kg)   BMI 24.69 kg/m  BP Readings from Last 3 Encounters:  03/19/20 (!) 187/83  11/23/19 (!) 158/87  09/07/19  (!) 158/75   Wt Readings from Last 3 Encounters:  03/19/20 135 lb (61.2 kg)  11/23/19 137 lb (62.1 kg)  09/07/19 139 lb 3.2 oz (63.1 kg)      Physical Exam Vitals reviewed.  Constitutional:      General: She is not in acute distress.    Appearance: She is well-developed. She is not diaphoretic.  Neck:     Thyroid: No thyromegaly.     Vascular: No JVD.     Trachea: No tracheal deviation.  Cardiovascular:     Rate and Rhythm: Normal rate and regular rhythm.     Pulses: Normal pulses.     Heart sounds: Normal heart sounds. No murmur heard.  No friction rub. No gallop.   Pulmonary:     Effort: Pulmonary effort is normal. No respiratory distress.     Breath sounds: Normal breath sounds. No wheezing or rales.  Musculoskeletal:     Cervical back: Normal range of motion and neck supple.     Right hip: No tenderness or bony tenderness. Decreased range of motion (IR of hip). Normal strength.     Left hip: No tenderness or bony tenderness. Normal range of motion. Normal strength.     Right upper leg: Normal.     Left upper leg: Normal.     Right knee: No swelling, effusion, erythema or bony tenderness. Normal range of motion. No tenderness. Normal alignment, normal meniscus and normal patellar mobility. Normal pulse.     Left knee: No swelling, effusion, erythema or bony tenderness. Normal range of motion. No tenderness.     Right lower leg: Edema (trace) present.     Left lower leg: Edema (trace) present.  Lymphadenopathy:     Cervical: No cervical adenopathy.  Skin:    General: Skin is warm and dry.     Capillary Refill: Capillary refill takes less than 2 seconds.       No results found for any visits on 03/19/20.  Assessment & Plan     1. Right leg pain Patient had fall in March to April and now with right leg pain that is intermittent. Suspect more OA component but will r/o compression fracture or hip fracture. I will f/u pending results. Discussed complications if OA is  severe enough that joint replacement may be required, since she does have moderate dementia. Will address in more detail if xrays are abnormal. Have also ordered [facility] for helping with mobility and strengthening, as well as nursing evaluation. Patient currently resides alone with frequent checks by family. May require more assistance as dementia progresses.  - DG Hip Unilat W OR W/O Pelvis 2-3 Views Right; Future - DG Knee Complete 4 Views Right; Future - DG Lumbar Spine Complete; Future - DG Sacrum/Coccyx; Future - Ambulatory referral to Home Health  2. Right leg weakness See above medical treatment plan. - DG Hip Unilat W OR W/O Pelvis 2-3 Views  Right; Future - DG Knee Complete 4 Views Right; Future - DG Lumbar Spine Complete; Future - DG Sacrum/Coccyx; Future - Ambulatory referral to Home Health  3. Fall, initial encounter See above medical treatment plan. - DG Hip Unilat W OR W/O Pelvis 2-3 Views Right; Future - DG Knee Complete 4 Views Right; Future - DG Lumbar Spine Complete; Future - DG Sacrum/Coccyx; Future - Ambulatory referral to [place] (primary) hypertension Elevated today but patient did not take medications.  - Ambulatory referral to Home Health  5. Late onset Alzheimer's disease without behavioral disturbance ([place]) Followed by Neurology.  - Ambulatory referral to [place]  6. Functional diarrhea Suspect from Aricept. Will stop Bentyl (dicyclomine) for now to see if diarrhea starts back. If so, may need to add Bentyl back.    No follow-ups on file.      Reynolds Bowl, PA-C, have reviewed all documentation for this visit. The documentation on 03/19/20 for the exam, diagnosis, procedures, and orders are all accurate and complete.   Rubye Beach  [facility] [phone] (phone) [phone] (fax)  [place]

## 2020-03-20 ENCOUNTER — Telehealth: Payer: Self-pay

## 2020-03-20 ENCOUNTER — Telehealth: Payer: Self-pay | Admitting: Physician Assistant

## 2020-03-20 DIAGNOSIS — M1711 Unilateral primary osteoarthritis, right knee: Secondary | ICD-10-CM

## 2020-03-20 MED ORDER — MELOXICAM 7.5 MG PO TABS: 7.5000 mg | ORAL_TABLET | Freq: Every day | ORAL | 3 refills | Status: DC

## 2020-03-20 NOTE — Telephone Encounter (Signed)
Lauren Manning patient's daughter advised of results. She is agreeable on trying the arthritis medication. Pharmacy: [facility] pharmacy

## 2020-03-20 NOTE — Telephone Encounter (Signed)
Meloxicam sent in. Just use on days when her knee bothers her.Take with food to protect stomach. If she starts complaining of abdominal pain please stop medication and let me know

## 2020-03-20 NOTE — Telephone Encounter (Signed)
-----   Message from Margaretann Loveless, [place] sent at 03/19/2020  5:51 PM EDT ----- Knee xray does show moderate arthritic changes. This is what could be bothering her. Could try to do a medication for arthritis that she would take as needed for the days it bothers her. If agreeable I will send in. Call daughter with results.

## 2020-03-20 NOTE — Telephone Encounter (Signed)
[name] advised.   Thanks,   -Vernona Rieger

## 2020-03-20 NOTE — Telephone Encounter (Signed)
Verbal ok for all requested

## 2020-03-20 NOTE — Telephone Encounter (Signed)
-----   Message from Margaretann Loveless, PA-C sent at 03/19/2020  5:53 PM EDT ----- Sacrum and coccyx are completely normal. No fractures.

## 2020-03-20 NOTE — Telephone Encounter (Signed)
-----   Message from Margaretann Loveless, [place] sent at 03/19/2020  5:50 PM EDT ----- Hip xray is normal. No fracture or arthritis. Call daughter with results please.

## 2020-03-20 NOTE — Telephone Encounter (Signed)
-----   Message from Margaretann Loveless, [place] sent at 03/19/2020  5:52 PM EDT ----- Back xray does show moderate arthritic changes as well. May contribute some, but I suspect knee is more likely the culprit. The Arthritis medicine would help this also if it does contribute some.

## 2020-03-20 NOTE — Telephone Encounter (Signed)
Copied from CRM [id number]. Topic: Quick Communication - Home Health Verbal Orders >> Mar 20, 2020  2:06 PM Darron Doom wrote: Caller/Agency: Elease Hashimoto / [facility]  Callback Number: [phone] Requesting OT/PT/Skilled Nursing/Social Work/Speech Therapy: Skilled nursing and medical Social Worker  Frequency: 2 w 2, 1w 3 nursing and 1day for eval medical social worker

## 2020-03-21 ENCOUNTER — Other Ambulatory Visit: Payer: Self-pay

## 2020-03-21 ENCOUNTER — Encounter: Payer: Self-pay | Admitting: Obstetrics and Gynecology

## 2020-03-21 ENCOUNTER — Ambulatory Visit (INDEPENDENT_AMBULATORY_CARE_PROVIDER_SITE_OTHER): Payer: Medicare (Managed Care) | Admitting: Obstetrics and Gynecology

## 2020-03-21 VITALS — BP 172/78 | HR 64 | Ht 62.0 in | Wt 136.9 lb

## 2020-03-21 DIAGNOSIS — N952 Postmenopausal atrophic vaginitis: Secondary | ICD-10-CM

## 2020-03-21 DIAGNOSIS — N8110 Cystocele, unspecified: Secondary | ICD-10-CM | POA: Diagnosis not present

## 2020-03-21 DIAGNOSIS — Z4689 Encounter for fitting and adjustment of other specified devices: Secondary | ICD-10-CM

## 2020-03-21 NOTE — Progress Notes (Signed)
Pt present for routine pessary check. Pt stated that she is doing well with the pessary.

## 2020-03-21 NOTE — Telephone Encounter (Signed)
Lauren Manning advised.   Thanks,   -  

## 2020-03-21 NOTE — Progress Notes (Signed)
° ° °  GYNECOLOGY PROGRESS NOTE  Subjective:    Patient ID: Lauren Manning, female    DOB: [birth year], 84 y.o.   MRN: [id number]  HPI  Patient is a 84 y.o. G67P2002 female who presents for a pessary check. Last pessary check was August 2020. She reports no vaginal bleeding or discharge. She denies pelvic discomfort and difficulty urinating or moving her bowels.  The following portions of the patient's history were reviewed and updated as appropriate: allergies, current medications, past family history, past medical history, past social history, past surgical history and problem list.  Review of Systems A comprehensive review of systems was negative.   Objective:   Blood pressure (!) 172/78, pulse 64, height 5\' 2"  (1.575 m), weight 136 lb 14.4 oz (62.1 kg). General appearance: alert and no distress Abdomen: soft, non-tender; bowel sounds normal; no masses,  no organomegaly Pelvic: The patient's size 2 3/4 Gelhorn pessary was removed, cleaned and replaced without complications. Speculum examination revealed vaginal mucosa with several small abrasions from pessary removal.  Moderate vaginal atrophy present.   Assessment:   Encounter for pessary maintenance Cystocele Grade 3 with cervical prolapse Vaginal atrophy  Plan:   - The patient should return in 3-4 months for a pessary check.  Given samples  Recommend use of Premarin cream to help treat abrasions. Given samples. To apply twice weekly. Also has a prescription.     , MD [facility]

## 2020-03-23 ENCOUNTER — Telehealth: Payer: Self-pay | Admitting: Physician Assistant

## 2020-03-23 NOTE — Telephone Encounter (Signed)
Verbal ok for requested treatments.

## 2020-03-23 NOTE — Telephone Encounter (Signed)
Advised as below.  

## 2020-03-23 NOTE — Telephone Encounter (Signed)
Home Health Verbal Orders - [facility]   Caller/Agency: [place]ellen [place]  [facility] Number: [phone] Requesting OT/PT/Skilled Nursing/Social Work/Speech Therapy:PT Frequency: 1x2 2x3  Line secured may leave VM if no answer

## 2020-06-05 ENCOUNTER — Other Ambulatory Visit: Payer: Self-pay

## 2020-06-05 ENCOUNTER — Ambulatory Visit (INDEPENDENT_AMBULATORY_CARE_PROVIDER_SITE_OTHER): Payer: Medicare Other | Admitting: Neurology

## 2020-06-05 ENCOUNTER — Encounter: Payer: Self-pay | Admitting: Neurology

## 2020-06-05 VITALS — BP 157/92 | HR 105 | Ht 62.0 in | Wt 146.4 lb

## 2020-06-05 DIAGNOSIS — F02818 Dementia in other diseases classified elsewhere, unspecified severity, with other behavioral disturbance: Secondary | ICD-10-CM

## 2020-06-05 DIAGNOSIS — G301 Alzheimer's disease with late onset: Secondary | ICD-10-CM

## 2020-06-05 DIAGNOSIS — F0281 Dementia in other diseases classified elsewhere with behavioral disturbance: Secondary | ICD-10-CM | POA: Diagnosis not present

## 2020-06-05 MED ORDER — RIVASTIGMINE TARTRATE 3 MG PO CAPS: 3.0000 mg | ORAL_CAPSULE | Freq: Two times a day (BID) | ORAL | 6 refills | Status: DC

## 2020-06-05 NOTE — Patient Instructions (Signed)
1. Increase Rivastigmine to 3mg  twice a day  2. Schedule repeat Neurocognitive testing  3. Continue close supervision  4. Follow-up in 6 months, call for any changes   FALL PRECAUTIONS: Be cautious when walking. Scan the area for obstacles that may increase the risk of trips and falls. When getting up in the mornings, sit up at the edge of the bed for a few minutes before getting out of bed. Consider elevating the bed at the head end to avoid drop of blood pressure when getting up. Walk always in a well-lit room (use night lights in the walls). Avoid area rugs or power cords from appliances in the middle of the walkways. Use a walker or a cane if necessary and consider physical therapy for balance exercise. Get your eyesight checked regularly.  HOME SAFETY: Consider the safety of the kitchen when operating appliances like stoves, microwave oven, and blender. Consider having supervision and share cooking responsibilities until no longer able to participate in those. Accidents with firearms and other hazards in the house should be identified and addressed as well.  ABILITY TO BE LEFT ALONE: If patient is unable to contact 911 operator, consider using LifeLine, or when the need is there, arrange for someone to stay with patients. Smoking is a fire hazard, consider supervision or cessation. Risk of wandering should be assessed by caregiver and if detected at any point, supervision and safe proof recommendations should be instituted.  MEDICATION SUPERVISION: Inability to self-administer medication needs to be constantly addressed. Implement a mechanism to ensure safe administration of the medications.  RECOMMENDATIONS FOR ALL PATIENTS WITH MEMORY PROBLEMS: 1. Continue to exercise (Recommend 30 minutes of walking everyday, or 3 hours every week) 2. Increase social interactions - continue going to [employer] and enjoy social gatherings with friends and family 3. Eat healthy, avoid fried foods and eat more  fruits and vegetables 4. Maintain adequate blood pressure, blood sugar, and blood cholesterol level. Reducing the risk of stroke and cardiovascular disease also helps promoting better memory. 5. Avoid stressful situations. Live a simple life and avoid aggravations. Organize your time and prepare for the next day in anticipation. 6. Sleep well, avoid any interruptions of sleep and avoid any distractions in the bedroom that may interfere with adequate sleep quality 7. Avoid sugar, avoid sweets as there is a strong link between excessive sugar intake, diabetes, and cognitive impairment The Mediterranean diet has been shown to help patients reduce the risk of progressive memory disorders and reduces cardiovascular risk. This includes eating fish, eat fruits and green leafy vegetables, nuts like almonds and hazelnuts, walnuts, and also use olive oil. Avoid fast foods and fried foods as much as possible. Avoid sweets and sugar as sugar use has been linked to worsening of memory function.  There is always a concern of gradual progression of memory problems. If this is the case, then we may need to adjust level of care according to patient needs. Support, both to the patient and caregiver, should then be put into place.

## 2020-06-05 NOTE — Progress Notes (Signed)
157/92 

## 2020-06-05 NOTE — Progress Notes (Signed)
NEUROLOGY FOLLOW UP OFFICE NOTE  Lauren Manning [id number] 13-Dec-1935  HISTORY OF PRESENT ILLNESS: I had the pleasure of seeing Lauren Manning in follow-up in the neurology clinic on 06/05/2020.  The patient was last seen 6 months ago for dementia. She is again accompanied by her daughter Lauren Manning who helps supplement the history today.  Records and images were personally reviewed where available.  SLUMS score 17/30 in 11/2019. She was having diarrhea which resolved with discontinuation of Donepezil. She was started on Rivastigmine 1.5mg  BID in April 2021, no side effects. She continues to live alone in a senior living apartment complex. She feels her memory is pretty good. Her daughter has not noticed significant change from last visit. She manages her own medications and says she remembers to take them, however her daughter reminds her that she forgot her medications yesterday. They tried to do pillpacks but she would still forget. A digital calendar was suggested however she forgets to look at it. Her daughter manages finances. She manages her own meals and denies leaving the stove on. She is independent with dressing and bathing. House is well-kept per daughter. She does not drive. She denies any headaches, dizziness. No paranoia or hallucinations, she gets a little aggravated with things. She fell twice in the past 6 months, she fell during Easter then 2 months after that. She says her right leg wants to give out on her.   History on Initial Assessment 11/23/2019: This is an 84 year old right-handed woman with a history of hypertension, hyperlipidemia, Alzheimer's disease, presenting for evaluation of dementia. Her daughter Lauren Manning is present to provide additional information. Family started noticing memory changes several years ago. She underwent Neuropsychological testing with Dr. Alinda Dooms in April 2018 with a diagnosis of Mild dementia most likely secondary to Alzheimer's disease, without behavioral  disturbance, Mild depression. Cognitive profile is suggestive of medial-temporal lobe involvement. She had been on Donepezil 5mg  daily, dose was increased to 10mg  daily. It was noted that she should continue to receive assistance with complex ADLs, and would be well suited for a continuing care retirement community or assisted living where she could have increased oversight of medications and meals. She continues to live alone in a senior living apartment complex where her neighbors look out for her. She manages her own medications and denies missing any doses. works close by and comes for lunch sometimes, and sees her pillbox would be empty, but states she did not take her medications this morning. One time, a neighbor called, they apparently called EMS because she went to her neighbor saying she thinks she took her medication twice, EMS checked her BP and she seemed fine. She stopped driving in . Lauren Manning took over finances in 2014. She denies misplacing things, however Lauren Manning reports that she could not find her cane or keys this morning. She cannot tell 2015 what she ate, and thinks she is eating the same things repeatedly. She has had some weight loss. She denies leaving the stove on. She is independent with dressing and bathing, she is "always cleaning," and house is well-kept. Lauren Manning has noticed that her mood has changed quite a bit, she gets irritated easily. She used to have birds and talked about strangling them because they were so noisy. They got rid of the birds this week. She cusses a lot more than before. No paranoia or hallucinations. She states her mood is okay. She has difficulties with sleep initiation and was prescribed nortriptyline, but they don't  think she filled the prescription.   She denies any headaches, dizziness, diplopia, dysarthria, dysphagia, neck/back pain, focal numbness/tingling/weakness, bowel dysfunction. No anosmia, tremors. She has had diarrhea for "years"  occurring 2-3 times a week. She had a bout this morning. Her daughter reports diverticulitis in the past, and that she still eats dairy because she forgets that she should avoid it. She reports a fall this morning, she lost her balance. No injuries. She usually ambulates with a cane. Her mother had Alzheimer's disease. No history of significant head injuries. No alcohol use.  I personally reviewed head CT without contrast done 11/2017 which did not show any acute changes. There was moderate degree of periventricular and subcortical white matter hypoattenuation compatible with chronic microvascular ischemia. Mild atrophy with ventricular sulcal prominence.  Laboratory Data: Lab Results  Component Value Date   TSH 2.980 11/23/2015     PAST MEDICAL HISTORY: Past Medical History:  Diagnosis Date  . Alzheimer disease (HCC)   . Arthritis    Gout  . Colon polyp   . Diverticulosis   . GERD (gastroesophageal reflux disease)   . HOH (hard of hearing)   . Hyperglycemia   . Hyperlipidemia   . Hypertension   . Memory impairment   . Multiple contusions of trunk   . Osteopenia     MEDICATIONS: Current Outpatient Medications on File Prior to Visit  Medication Sig Dispense Refill  . amLODipine (NORVASC) 10 MG tablet Take 1 tablet (10 mg total) by mouth daily. 90 tablet 3  . aspirin 81 MG tablet Take 81 mg by mouth daily.    Marland Kitchen conjugated estrogens (PREMARIN) vaginal cream Place 1 Applicatorful vaginally 2 (two) times a week. Insert 0.5 mg of cream vaginally twice weekly 42.5 g 12  . diclofenac (VOLTAREN) 50 MG EC tablet TAKE 1 TABLET BY MOUTH TWICE (2) DAILY 180 tablet 1  . hydrochlorothiazide (MICROZIDE) 12.5 MG capsule Take 1 capsule (12.5 mg total) by mouth daily. 90 capsule 3  . lovastatin (MEVACOR) 40 MG tablet Take 1 tablet (40 mg total) by mouth at bedtime. 90 tablet 3  . meloxicam (MOBIC) 7.5 MG tablet Take 1 tablet (7.5 mg total) by mouth daily. 30 tablet 3  . metoprolol succinate  (TOPROL-XL) 25 MG 24 hr tablet Take 1 tablet (25 mg total) by mouth daily. 90 tablet 3  . nortriptyline (PAMELOR) 25 MG capsule Take 1 capsule (25 mg total) by mouth at bedtime. 90 capsule 1  . rivastigmine (EXELON) 1.5 MG capsule Take 1 capsule (1.5 mg total) by mouth 2 (two) times daily. 180 capsule 1   No current facility-administered medications on file prior to visit.    ALLERGIES: Allergies  Allergen Reactions  . Enalapril Other (See Comments)    Pt. Denies this allergy Other reaction(s): Unknown  . Penicillins     Has patient had a PCN reaction causing immediate rash, facial/tongue/throat swelling, SOB or lightheadedness with hypotension:unsure Has patient had a PCN reaction causing severe rash involving mucus membranes or skin necrosis:unsure Has patient had a PCN reaction that required hospitalization:unsure Has patient had a PCN reaction occurring within the last 10 years:unsure If all of the above answers are "NO", then may proceed with Cephalosporin use.  Other reaction(s): Unknown    FAMILY HISTORY: Family History  Problem Relation Age of Onset  . Dementia Mother   . Prostate cancer Father   . Breast cancer Daughter 66       breast cancer    SOCIAL HISTORY: Social  History   Socioeconomic History  . Marital status: Single    Spouse name: Not on file  . Number of children: Not on file  . Years of education: Not on file  . Highest education level: Not on file  Occupational History  . Not on file  Tobacco Use  . Smoking status: Never Smoker  . Smokeless tobacco: Never Used  Vaping Use  . Vaping Use: Never used  Substance and Sexual Activity  . Alcohol use: No  . Drug use: No  . Sexual activity: Not Currently    Birth control/protection: None  Other Topics Concern  . Not on file  Social History Narrative   Right handed    Lives alone   One story home no steps    Social Determinants of Health   Financial Resource Strain:   . Difficulty of Paying  Living Expenses: Not on file  Food Insecurity:   . Worried About Programme researcher, broadcasting/film/video in the Last Year: Not on file  . Ran Out of Food in the Last Year: Not on file  Transportation Needs:   . Lack of Transportation (Medical): Not on file  . Lack of Transportation (Non-Medical): Not on file  Physical Activity:   . Days of Exercise per Week: Not on file  . Minutes of Exercise per Session: Not on file  Stress:   . Feeling of Stress : Not on file  Social Connections:   . Frequency of Communication with Friends and Family: Not on file  . Frequency of Social Gatherings with Friends and Family: Not on file  . Attends Religious Services: Not on file  . Active Member of Clubs or Organizations: Not on file  . Attends Banker Meetings: Not on file  . Marital Status: Not on file  Intimate Partner Violence:   . Fear of Current or Ex-Partner: Not on file  . Emotionally Abused: Not on file  . Physically Abused: Not on file  . Sexually Abused: Not on file    PHYSICAL EXAM: Vitals:   06/05/20 1559  BP: (!) 157/92  Pulse: (!) 105  SpO2: 95%   General: No acute distress Head:  Normocephalic/atraumatic Skin/Extremities: No rash, no edema Neurological Exam: awake and alert. No aphasia or dysarthria. Fund of knowledge is appropriate.  Recent and remote memory are impaired.  Attention and concentration are normal. Cranial nerves: Pupils equal, round. Extraocular movements intact with no nystagmus. Visual fields full. No facial asymmetry. Hard of hearing. Motor: Bulk and tone normal, muscle strength 5/5 throughout with no pronator drift. Finger to nose testing intact.  Gait slow and cautious with cane, no ataxia.   IMPRESSION: This is an 83 yo RH woman with a history of hypertension, hyperlipidemia, and mild dementia most likely secondary to Alzheimer's disease. SLUMS score 17/30 in 11/2019. She is tolerating Rivastigmine better, increase dose to 3mg  BID. She is having difficulties  managing medications independently, her daughter is hoping for Medicaid assistance. It was noted on prior Neuropsychological testing that she should continue to receive assistance with complex ADLS and would be well suited for ALF or a continuing care retirement community with increased oversight of medications and meals. Proceed with repeat Neuropsychological testing as previously discussed. Continue close supervision. She does not drive. Follow-up in 6 months, they know to call for any changes.   Thank you for allowing me to participate in her care.  Please do not hesitate to call for any questions or concerns.   ,  M.D.   CC: Joycelyn ManJennifer Burnette, PA-C

## 2020-06-19 ENCOUNTER — Other Ambulatory Visit: Payer: Self-pay | Admitting: Physician Assistant

## 2020-06-19 DIAGNOSIS — M1990 Unspecified osteoarthritis, unspecified site: Secondary | ICD-10-CM

## 2020-09-01 ENCOUNTER — Other Ambulatory Visit: Payer: Self-pay | Admitting: Physician Assistant

## 2020-09-01 DIAGNOSIS — M1990 Unspecified osteoarthritis, unspecified site: Secondary | ICD-10-CM

## 2020-09-03 ENCOUNTER — Other Ambulatory Visit: Payer: Self-pay | Admitting: Physician Assistant

## 2020-09-03 ENCOUNTER — Telehealth: Payer: Self-pay | Admitting: Physician Assistant

## 2020-09-03 DIAGNOSIS — M1990 Unspecified osteoarthritis, unspecified site: Secondary | ICD-10-CM

## 2020-09-03 NOTE — Telephone Encounter (Signed)
Copied from CRM [id number]. Topic: Medicare AWV >> Sep 03, 2020  2:27 PM Claudette Laws R wrote: Reason for CRM:   No answer unable to leave message for patient to call back and schedule Medicare Annual Wellness Visit (AWV) in office.   If not able to come in office, please offer to do virtually.   Last AWV 09/07/2019  Please schedule at anytime with [facility] Health Advisor.  If any questions, please contact me at [phone]

## 2020-09-06 ENCOUNTER — Encounter: Payer: Self-pay | Admitting: Psychology

## 2020-09-06 ENCOUNTER — Other Ambulatory Visit: Payer: Self-pay

## 2020-09-06 ENCOUNTER — Ambulatory Visit: Payer: Medicare Other | Admitting: Psychology

## 2020-09-06 ENCOUNTER — Ambulatory Visit (INDEPENDENT_AMBULATORY_CARE_PROVIDER_SITE_OTHER): Payer: Medicare Other | Admitting: Psychology

## 2020-09-06 DIAGNOSIS — R4189 Other symptoms and signs involving cognitive functions and awareness: Secondary | ICD-10-CM

## 2020-09-06 DIAGNOSIS — R739 Hyperglycemia, unspecified: Secondary | ICD-10-CM | POA: Insufficient documentation

## 2020-09-06 DIAGNOSIS — F0280 Dementia in other diseases classified elsewhere without behavioral disturbance: Secondary | ICD-10-CM

## 2020-09-06 DIAGNOSIS — K219 Gastro-esophageal reflux disease without esophagitis: Secondary | ICD-10-CM | POA: Insufficient documentation

## 2020-09-06 DIAGNOSIS — G309 Alzheimer's disease, unspecified: Secondary | ICD-10-CM

## 2020-09-06 DIAGNOSIS — E785 Hyperlipidemia, unspecified: Secondary | ICD-10-CM | POA: Insufficient documentation

## 2020-09-06 DIAGNOSIS — H9190 Unspecified hearing loss, unspecified ear: Secondary | ICD-10-CM | POA: Insufficient documentation

## 2020-09-06 NOTE — Progress Notes (Signed)
   Psychometrician Note   Cognitive testing was administered to Lauren Manning by Wallace Keller, B.S. (psychometrist) under the supervision of Dr. Newman Nickels, Ph.D., licensed psychologist on 09/06/20. [name] did not appear overtly distressed by the testing session per behavioral observation or responses across self-report questionnaires. Dr. Newman Nickels, Ph.D. checked in with [name] as needed to manage any distress related to testing procedures (if applicable). Rest breaks were offered.    The battery of tests administered was selected by Dr. Newman Nickels, Ph.D. with consideration to [name] current level of functioning, the nature of her symptoms, emotional and behavioral responses during interview, level of literacy, observed level of motivation/effort, and the nature of the referral question. This battery was communicated to the psychometrist. Communication between Dr. Newman Nickels, Ph.D. and the psychometrist was ongoing throughout the evaluation and Dr. Newman Nickels, Ph.D. was immediately accessible at all times. Dr. Newman Nickels, Ph.D. provided supervision to the psychometrist on the date of this service to the extent necessary to assure the quality of all services provided.    Lauren Manning will return within approximately 1-2 weeks for an interactive feedback session with Dr. Milbert Coulter at which time her test performances, clinical impressions, and treatment recommendations will be reviewed in detail. [name] understands she can contact our office should she require our assistance before this time.  A total of 120 minutes of billable time were spent face-to-face with [name] by the psychometrist. This includes both test administration and scoring time. Billing for these services is reflected in the clinical report generated by Dr. Newman Nickels, Ph.D..  This note reflects time spent with the psychometrician and does not include test scores or any clinical  interpretations made by Dr. Milbert Coulter. The full report will follow in a separate note.

## 2020-09-06 NOTE — Progress Notes (Signed)
NEUROPSYCHOLOGICAL EVALUATION [place]. [facility] Department of Neurology  Date of Evaluation: September 06, 2020  Reason for Referral:   ZAMANI CROCKER is a 84 y.o. right-handed Caucasian female referred by Ellouise Newer, M.D., to characterize her current cognitive functioning and assist with diagnostic clarity and treatment planning in the context of prior history of late-onset Alzheimer's disease and concern for cognitive decline.   Assessment and Plan:   Clinical Impression(s): Ms. Schriever' pattern of performance is suggestive of fairly diffuse cognitive impairment with prominent dysfunction surrounding learning and memory. Domains of processing speed, executive functioning, and receptive and expressive language also exhibited cognitive impairment. Performance variability was exhibited across complex attention and visuospatial abilities, while performance was appropriate across basic attention and a task assessing safety and judgment. While Ms. Landeck largely denied ongoing difficulties, her daughter noted Ms. Murtha having difficulties completing instrumental activities of daily living (ADLs) independently, particularly surrounding medication and financial management, as well as driving. This, coupled with evidence for significant cognitive dysfunction described above, suggests that she meets criteria for a Major Neurocognitive Disorder ("dementia") at the present time.  Relative to her previous neuropsychological evaluation on 12/18/2016, performance declines were seen across domains of processing speed, semantic fluency, and visuoconstructional abilities. Other abilities were largely stable. However, it should be noted that prominent deficits were present during the prior evaluation and reported stability in certain domains (e.g., memory, confrontation naming) are stable largely due to the inability to obtain significantly lower scores. Regarding etiology, I share Dr.  Corliss Blacker prior concerns surrounding Alzheimer's disease. Ms. Butikofer was fully amnestic across all memory tasks and largely performed poorly across recognition trials, suggesting a memory storage deficit which is the hallmark characteristic of this condition. Impairments in confrontation naming, semantic fluency, and executive functioning, as well as variability across visuospatial abilities, are further consistent with this condition. Continued medical monitoring will be important moving forward.   Recommendations: Ms. Mcmurry is already taking a medication commonly prescribed to individuals with concerns surrounding memory loss and Alzheimer's disease (rivastigmine/Exelon). She is encouraged to continue taking this medication as prescribed. It is important to highlight that while this medication may slow functional decline in some individuals, there is no current treatment which can stop or reverse cognitive decline in the face of a neurodegenerative illness.   If not already done, Ms. Moncada and her family may want to discuss her wishes regarding durable power of attorney and medical decision making, so that she can have input into these choices. Additionally, they may wish to discuss future plans for caretaking and seek out community options for in home/residential care should they become necessary.   It will be important for Ms. Rusconi to have another person with her when in situations where he may need to process information, weigh the pros and cons of different options, and make decisions, in order to ensure that she fully understands and recalls all information to be considered. The importance of this is added given that Ms. Borromeo appears to have very limited insight into the extent of her ongoing dysfunction.  Should there be further progression of her current deficits over time, Ms. Cravey is unlikely to regain any independent living skills lost. She will likely benefit from the establishment and  maintenance of a routine in order to maximize her functional abilities over time.  Studies have linked uncorrected hearing loss to the development and exacerbation of dementia symptoms. As such, treating her hearing loss would be beneficial in improving her  day-to-day functioning. However, I understand that Ms. Gaspar has been resistant to this in the past and even declined using an amplification device during testing.  Review of Records:   Ms. Daugherty completed a comprehensive neuropsychological evaluation Kandis Nab, Psy.D.) on 12/18/2016. Results of that evaluation revealed several areas of impairment, including verbal and non-verbal memory (at the level of consolidation and retrieval), confrontation naming, mental flexibility, and set-shifting. Additionally, there was evidence to suggest that cognitive deficits were interfering with her ability to manage complex tasks, such as driving and managing finances. As such, diagnostic criteria for a dementia syndrome were said to be met. Ms. Wahlberg' cognitive profile was said to be suggestive of medial-temporal lobe involvement, raising prominent concerns for Alzheimer's disease. There was no evidence of behavioral disturbances at that time and her presentation was said to be in the mild stage.  Most recently, Ms. Mcgregory was seen by [facility] Ellouise Newer, M.D.) on 06/05/2020 for follow-up. She was having diarrhea which resolved with discontinuation of donepezil. She was started on Rivastigmine 1.53m BID in April 2021 and has not reported ongoing side effects. She continues to live alone in a senior living apartment complex. She described her memory as pretty good. Her daughter has not noticed significant change from last visit. She reported managing her medications and stated remembering to take them; however her daughter reminded her that she forgot her medications the previous day. They tried to have her medications organized in pillpacks but  she would still forget. A digital calendar was suggested, however she will forget to look at it. Her daughter manages finances. Ms. DFurukawamanages her own meals and denied leaving the stove on. She is independent with dressing and bathing and her house is well-kept per her daughter. She does not drive. Ms. DAngelodenied any headaches, dizziness, paranoia, or hallucinations. She fell twice in the past 6 months; once during Easter then two months after that. She stated that her right leg wants to give out on her. Performance on a brief cognitive screening instrument (SLUMS) was 17/30 in March 2021. Ultimately, Ms. DZellarswas referred for a comprehensive neuropsychological evaluation to characterize her cognitive abilities and to assist with diagnostic clarity and treatment planning.   Head CT on 05/30/2013 revealed white matter changes consistent with chronic ischemia. Head CT on 11/23/2017 in the context of slurred speech and right jaw pain was negative for an acute intracranial abnormality. It did suggest stable atrophy with chronic moderate small vessel ischemia.   Past Medical History:  Diagnosis Date  . Arteriosclerotic cardiovascular disease 11/21/2015  . Arthritis    Gout  . Arthritis urica 11/21/2015  . Baden-Walker grade 3 cystocele 05/19/2016  . Cataract 11/21/2015  . Cervical prolapse 11/21/2015  . DD (diverticular disease) 11/21/2015  . Essential (primary) hypertension 11/21/2015  . GERD (gastroesophageal reflux disease)   . Hereditary hemochromatosis 06/09/2016  . History of colonic polyps   . HOH (hard of hearing)   . Hypercholesteremia 11/21/2015  . Hyperglycemia   . Hyperlipidemia   . Insomnia 11/21/2015  . Low back pain 11/21/2015  . Major neurocognitive disorder due to Alzheimer's disease, without behavioral disturbance 11/21/2015  . Multiple contusions of trunk   . Neck pain 12/12/2014  . Osteopenia     Past Surgical History:  Procedure Laterality Date  . ABDOMINAL HYSTERECTOMY    . CATARACT  EXTRACTION W/PHACO Left 07/22/2016   Procedure: CATARACT EXTRACTION PHACO AND INTRAOCULAR LENS PLACEMENT (IOC);  Surgeon: WBirder Robson MD;  [facility];  Service: Ophthalmology;  Laterality: Left;  [place] 43.1 AP% 20.2 CDE 8.71 Fluid pack lot # [id number]  . CHOLECYSTECTOMY  04/16/2004   Dr. Damita Dunnings. Anterior and posterior colporrhaphy  . COLONOSCOPY WITH PROPOFOL N/A 07/01/2016   Procedure: COLONOSCOPY WITH PROPOFOL;  Surgeon: Lucilla Lame, MD;  Location: [facility] ENDOSCOPY;  Service: Endoscopy;  Laterality: N/A;  . history of colon polyps  1999  . PARTIAL HYSTERECTOMY    . TONSILLECTOMY    . TUBAL LIGATION      Current Outpatient Medications:  .  amLODipine (NORVASC) 10 MG tablet, Take 1 tablet (10 mg total) by mouth daily., Disp: 90 tablet, Rfl: 3 .  aspirin 81 MG tablet, Take 81 mg by mouth daily., Disp: , Rfl:  .  conjugated estrogens (PREMARIN) vaginal cream, Place 1 Applicatorful vaginally 2 (two) times a week. Insert 0.5 mg of cream vaginally twice weekly, Disp: 42.5 g, Rfl: 12 .  diclofenac (VOLTAREN) 50 MG EC tablet, TAKE 1 TABLET BY MOUTH TWICE A DAY, Disp: 180 tablet, Rfl: 0 .  hydrochlorothiazide (MICROZIDE) 12.5 MG capsule, Take 1 capsule (12.5 mg total) by mouth daily., Disp: 90 capsule, Rfl: 3 .  lovastatin (MEVACOR) 40 MG tablet, Take 1 tablet (40 mg total) by mouth at bedtime., Disp: 90 tablet, Rfl: 3 .  meloxicam (MOBIC) 7.5 MG tablet, Take 1 tablet (7.5 mg total) by mouth daily., Disp: 30 tablet, Rfl: 3 .  metoprolol succinate (TOPROL-XL) 25 MG 24 hr tablet, Take 1 tablet (25 mg total) by mouth daily., Disp: 90 tablet, Rfl: 3 .  nortriptyline (PAMELOR) 25 MG capsule, Take 1 capsule (25 mg total) by mouth at bedtime., Disp: 90 capsule, Rfl: 1 .  rivastigmine (EXELON) 3 MG capsule, Take 1 capsule (3 mg total) by mouth 2 (two) times daily., Disp: 180 capsule, Rfl: 6  Clinical Interview:   The following information was obtained during a clinical interview with Ms. Gwinn  and her daughter prior to cognitive testing.  Cognitive Symptoms: Decreased short-term memory: Denied. However, her daughter reported prominent concerns surrounding short-term memory. These include Ms. Kretz having trouble recalling the details of previous conversations, misplacing or losing things, forgetting how to operate household devices, repeating herself frequently, and requiring repetition from those speaking to her.  Decreased long-term memory: Denied. Decreased attention/concentration: Denied. However, her daughter noted that Ms. Cervantes seems to be far more easily sidetracked or distracted than previously.  Reduced processing speed: Unclear. Ms. Allaire did not appear to comprehend the question and was unable to answer.  Difficulties with executive functions: Denied. Personality changes/concerns were further denied by her daughter.  Difficulties with emotion regulation: Denied. Difficulties with receptive language: Denied. Her daughter reported ongoing hearing loss and that her mother has but is resistant to wearing hearing aids. It is unclear if trouble with comprehension is related to hearing loss, distractibility, or memory concerns (or perhaps a combination of all three).  Difficulties with word finding: Denied. Decreased visuoperceptual ability: Denied. However, her daughter reported that her mother will often misperceive steps and has fallen several times in the past.   Trajectory of deficits: Ms. Shelden denied all cognitive concerns and expressed her belief that there have been no changes since her previous evaluation in 2018. Her daughter reported the gradual worsening of cognitive abilities, especially over the past 2-3 years.   Difficulties completing ADLs: Endorsed. Her daughter reported significant difficulties with medication management and adherence in the past due to memory concerns. Lately, medications were said to be provided in  pre-packaged pillpacks. While this has improved  the situation, her daughter noted that Ms. Ruddy will generally be unclear as to what day it is and which medications to take. This is despite her being purchased a digital clock which states the current date and time. Her daughter manages all bill paying and financial pursuits. Ms. Scali does not drive due to cognitive concerns. Her daughter did note that she lives alone and maintains a clean and well-kept house. There was no report of her leaving the stove on or engaging in other unsafe behaviors.   Additional Medical History: History of traumatic brain injury/concussion: Denied. History of stroke: Denied. History of seizure activity: Denied. History of known exposure to toxins: Denied. Symptoms of chronic pain: Denied. Experience of frequent headaches/migraines: Denied. Headaches were said to occur only "once in a while." Frequent instances of dizziness/vertigo: Denied. Rare symptoms were reported, generally when she stands up quickly from a previous sitting or prone/supine position.   Sensory changes: Ms. Reddington has been prescribed hearing aids but generally refuses to wear them per her daughter. Ms. Balik denied ongoing sensory changes/difficulties (e.g., vision, taste, or smell). Balance/coordination difficulties: Endorsed. She reported some balance instability, largely attributed to episodes of dizziness where she stands up too quickly. She also reported that her knee seems to want to give out. There is a history of falls, most recent being this past Easter where she fell down a flight of stairs. She ambulates with a cane when outside of her home as an extra precaution.  Other motor difficulties: Denied.  Sleep History: Estimated hours obtained each night: 8 hours. Difficulties falling asleep: Endorsed. However, these are improved with medication. Difficulties staying asleep: Denied. Feels rested and refreshed upon awakening: Denied. She reported still feeling somewhat tired upon awakening.    History of snoring: Denied. History of waking up gasping for air: Denied. Witnessed breath cessation while asleep: Denied.  History of vivid dreaming: Denied. Excessive movement while asleep: Denied. Instances of acting out her dreams: Denied.  Psychiatric/Behavioral Health History: Depression: Denied. She described her current mood as "all right" and denied any previous mental health concerns or diagnoses. Current or remote suicidal ideation, intent, or plan was denied.  Anxiety: Denied. Mania: Denied. Trauma History: Denied. Visual/auditory hallucinations: Denied. Delusional thoughts: Denied.  Tobacco: Denied. Alcohol: She denied current alcohol consumption, as well as a history of problematic alcohol abuse or dependence.  Recreational drugs: Denied. Caffeine: She reported consuming an occasional cappuccino or hot tea during the week.   Family History: Problem Relation Age of Onset  . Dementia Mother   . Prostate cancer Father   . Breast cancer Daughter 13       breast cancer   This information was confirmed by Ms. Lineberry.  Academic/Vocational History: Highest level of educational attainment: 12 years. Ms. Utke was born very premature, weighing 2 lbs 14 oz. She was enrolled in special classes for "slow learners" while in elementary school. However, she did eventually progress to regular classes. She graduated from high school and described herself as an average (C) student in academic settings. Math was noted as a potential relative weakness.  History of developmental delay: Denied. History of grade repetition: Denied. History of LD/ADHD: Denied.  Employment: Retired. Ms. Balderrama was unable to recall previous employment positions during interview. 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 Her daughter noted that she also worked in [employer], [employer] in the past.  Evaluation Results:   Behavioral  Observations: Ms. Lastinger was accompanied by her daughter, arrived to her appointment on time, and was appropriately dressed and groomed. She appeared alert and oriented. Observed gait and station were within normal limits. She did ambulate with a cane but did not appear to require its use. Gross motor functioning appeared intact upon informal observation and no abnormal movements (e.g., tremors) were noted. Her affect was generally relaxed and positive, but did range appropriately given the subject being discussed during the clinical interview or the task at hand during testing procedures. Spontaneous speech was fluent and word finding difficulties were not observed during the clinical interview. Thought processes were coherent, organized, and normal in content. Insight into her cognitive difficulties appeared limited in that she denied all cognitive dysfunction despite pronounced impairment across testing. An amplification device (i.e., pocket talker) was offered to Ms. Wollen prior to testing to help ameliorate hearing concerns. However, she declined to utilize this device. Hearing loss was apparent during the evaluation and instructions often had to be repeated. This was especially true across more complex tasks (TMT B, D-KEFS Color-Word) where it was apparent that hearing loss and comprehension deficits were both present. Sustained attention was generally appropriate. Brief staring spells were observed at times. Task engagement was adequate. However, she did require some encouragement from the psychometrist to persist with more challenging tasks. Overall, Ms. Kallenberger was cooperative with the clinical interview and subsequent testing procedures.   Adequacy of Effort: The validity of neuropsychological testing is limited by the extent to which the individual being tested may be assumed to have exerted adequate effort during testing. Ms. Batley expressed her intention to perform to the best of her abilities and  exhibited adequate task engagement and persistence. Scores across stand-alone and embedded performance validity measures were variable. However, below expectation performances are likely due to true cognitive dysfunction rather than poor engagement or attempts to perform poorly. As such, the results of the current evaluation are believed to be a valid representation of Ms. Paternostro' current cognitive functioning.  Test Results: Ms. Gunnarson was poorly oriented at the time of the current evaluation. She was unable to state her address. She was also unable to state the current date, day of the week, time, current location, or current city.   Intellectual abilities based upon educational and vocational attainment were estimated to be in the average range. Premorbid abilities were estimated to be within the below average range based upon a single-word reading test.   Processing speed was average across a rapid color naming task but exceptionally low across all other related tasks. Basic attention was below average to average. More complex attention (e.g., working memory) was well below average to below average. Executive functioning was exceptionally low. She performed in the below average range across a task assessing safety and judgment. Points were generally lost for vague rather than incorrect responses.   Assessed receptive language abilities were well below average. Likewise, Ms. Kilty did exhibit difficulties comprehending task instructions and questions asked of her during interview. This may have been impacted by ongoing hearing loss. Assessed expressive language (e.g., verbal fluency and confrontation naming) was well below average to below average overall.   Assessed visuospatial/visuoconstructional abilities were variable, ranging from the exceptionally low to below average normative ranges. Points were lost on her drawing of a clock due to incorrect hand placement. Points were lost on her drawing of a  complex figure due to mild visual distortions and spatial abnormalities.  Learning (i.e., encoding) of novel verbal information was exceptionally low. Spontaneous delayed recall (i.e., retrieval) of previously learned information was exceptionally low to well below average. Retention rates were 0% across a story learning task, 0% across a list learning task, and 0% across a complex figure drawing task. Performance across recognition tasks was exceptionally low to below average, suggesting minimal evidence for information consolidation.   Results of emotional screening instruments suggested that recent symptoms of generalized anxiety were in the minimal range, while symptoms of depression were within normal limits. A screening instrument assessing recent sleep quality suggested the presence of minimal sleep dysfunction.  Tables of Scores:   Note: This summary of test scores accompanies the interpretive report and should not be considered in isolation without reference to the appropriate sections in the text. Descriptors are based on appropriate normative data and may be adjusted based on clinical judgment. The terms "impaired" and "within normal limits (WNL)" are used when a more specific level of functioning cannot be determined.       Effort Testing:   DESCRIPTOR       Dot Counting Test: --- --- Within Expectation  RBANS Effort Index: --- --- Within Expectation  WAIS-IV Reliable Digit Span: --- --- Below Expectation       Orientation:      Raw Score Percentile   NAB Orientation, Form 1 19/29 --- ---       Cognitive Screening:           Raw Score Percentile   SLUMS: 10/30 --- ---       RBANS, Form A: Standard Score/ Scaled Score Percentile   Total Score 54 <1 Exceptionally Low  Immediate Memory 40 <1 Exceptionally Low    List Learning 1 <1 Exceptionally Low    Story Memory 1 <1 Exceptionally Low  Visuospatial/Constructional 64 1 Exceptionally Low    Figure Copy 6 9 Below Average     Line Orientation 5/20 <2 Exceptionally Low  Language 86 18 Below Average    Picture Naming 9/10 26-50 Average    Semantic Fluency 4 2 Well Below Average  Attention 72 3 Well Below Average    Digit Span 9 37 Average    Coding 2 <1 Exceptionally Low  Delayed Memory 52 <1 Exceptionally Low    List Recall 0/10 <2 Exceptionally Low    List Recognition 16/20 3-9 Well Below Average    Story Recall 1 <1 Exceptionally Low    Story Recognition 8/12 14-28 Below Average    Figure Recall 1 <1 Exceptionally Low    Figure Recognition 0/8 <1 Exceptionally Low       Intellectual Functioning:           Standard Score Percentile   Test of Premorbid Functioning: 82 12 Below Average       Attention/Executive Function:          Trail Making Test (TMT): Raw Score (T Score) Percentile     Part A 127 secs.,  0 errors (23) <1 Exceptionally Low    Part B Discontinued --- Impaired         Scaled Score Percentile   WAIS-IV Digit Span: 4 2 Well Below Average    Forward 6 9 Below Average    Backward 6 9 Below Average    Sequencing 5 5 Well Below Average        Scaled Score Percentile   WAIS-IV Similarities: 1 <1 Exceptionally Low       D-KEFS Color-Word Interference Test:  Raw Score (Scaled Score) Percentile     Color Naming 41 secs. (8) 25 Average    Word Reading 47 secs. (1) <1 Exceptionally Low    Inhibition 114 secs. (7) 16 Below Average      Total Errors 8 errors (6) 9 Below Average    Inhibition/Switching 151 secs. (4) 2 Well Below Average      Total Errors 16 errors (1) <1 Exceptionally Low       NAB Executive Functions Module, Form 1: T Score Percentile     Judgment 42 21 Below Average       Language:          Verbal Fluency Test: Raw Score (Scaled Score) Percentile     Phonemic Fluency (CFL) 17 (6) 9 Below Average    Category Fluency 18 (4) 2 Well Below Average  *Based on [employer] Older Normative Studies (MOANS)          NAB Language Module, Form 1: T Score Percentile     Auditory  Comprehension 31 3 Well Below Average    Naming 22/31 (32) 4 Well Below Average       Visuospatial/Visuoconstruction:      Raw Score Percentile   Clock Drawing: 8/10 --- Within Normal Limits       Mood and Personality:      Raw Score Percentile   Geriatric Depression Scale: 8 --- Within Normal Limits  Geriatric Anxiety Scale: 4 --- Minimal    Somatic 1 --- Minimal    Cognitive 1 --- Minimal    Affective 2 --- Minimal       Additional Questionnaires:      Raw Score Percentile   PROMIS Sleep Disturbance Questionnaire: 13 --- None to Slight   Informed Consent and Coding/Compliance:   Ms. Sharples was provided with a verbal description of the nature and purpose of the present neuropsychological evaluation. Also reviewed were the foreseeable risks and/or discomforts and benefits of the procedure, limits of confidentiality, and mandatory reporting requirements of this provider. The patient was given the opportunity to ask questions and receive answers about the evaluation. Oral consent to participate was provided by the patient.   This evaluation was conducted by Christia Reading, Ph.D., licensed clinical neuropsychologist. Ms. Chicas completed a 35 minute comprehensive clinical interview with Dr. Melvyn Novas, billed as one unit [phone], and 120 minutes of cognitive testing and scoring, billed as one unit [phone] and three additional units [id number]. Psychometrist Milana Kidney, B.S., assisted Dr. Melvyn Novas with test administration and scoring procedures. As a separate and discrete service, Dr. Melvyn Novas spent a total of 160 minutes in interpretation and report writing billed as one unit [phone] and two units [id number].

## 2020-09-13 ENCOUNTER — Ambulatory Visit (INDEPENDENT_AMBULATORY_CARE_PROVIDER_SITE_OTHER): Payer: Medicare Other | Admitting: Psychology

## 2020-09-13 ENCOUNTER — Other Ambulatory Visit: Payer: Self-pay

## 2020-09-13 DIAGNOSIS — F0280 Dementia in other diseases classified elsewhere without behavioral disturbance: Secondary | ICD-10-CM

## 2020-09-13 DIAGNOSIS — G309 Alzheimer's disease, unspecified: Secondary | ICD-10-CM

## 2020-09-13 NOTE — Patient Instructions (Signed)
Ms. Dauphinee is already taking a medication commonly prescribed to individuals with concerns surrounding memory loss and Alzheimer's disease (rivastigmine/Exelon). She is encouraged to continue taking this medication as prescribed. It is important to highlight that while this medication may slow functional decline in some individuals, there is no current treatment which can stop or reverse cognitive decline in the face of a neurodegenerative illness.   If not already done, Ms. Bouchillon and her family may want to discuss her wishes regarding durable power of attorney and medical decision making, so that she can have input into these choices. Additionally, they may wish to discuss future plans for caretaking and seek out community options for in home/residential care should they become necessary.   It will be important for Ms. Deangelo to have another person with her when in situations where he may need to process information, weigh the pros and cons of different options, and make decisions, in order to ensure that she fully understands and recalls all information to be considered. The importance of this is added given that Ms. Sharber appears to have very limited insight into the extent of her ongoing dysfunction.  Should there be further progression of her current deficits over time, Ms. Westcott is unlikely to regain any independent living skills lost. She will likely benefit from the establishment and maintenance of a routine in order to maximize her functional abilities over time.  Studies have linked uncorrected hearing loss to the development and exacerbation of dementia symptoms. As such, treating her hearing loss would be beneficial in improving her day-to-day functioning. However, I understand that Ms. Bosque has been resistant to this in the past and even declined using an amplification device during testing.

## 2020-09-13 NOTE — Progress Notes (Signed)
   Neuropsychology Feedback Session Lauren Manning. [facility] Department of Neurology  Reason for Referral:   Lauren Manning a 84 y.o. right-handed Caucasian female referred by Lauren Manning, M.D.,to characterize hercurrent cognitive functioning and assist with diagnostic clarity and treatment planning in the context of prior history of late-onset Alzheimer's disease and concern for cognitive decline.   Feedback:   Lauren Manning completed a comprehensive neuropsychological evaluation on 09/06/2020. Please refer to that encounter for the full report and recommendations. Briefly, results suggested fairly diffuse cognitive impairment with prominent dysfunction surrounding learning and memory. Domains of processing speed, executive functioning, and receptive and expressive language also exhibited cognitive impairment. Performance variability was exhibited across complex attention and visuospatial abilities, while performance was appropriate across basic attention and a task assessing safety and judgment. Relative to her previous neuropsychological evaluation on 12/18/2016, performance declines were seen across domains of processing speed, semantic fluency, and visuoconstructional abilities. Regarding etiology, I share Lauren Manning prior concerns surrounding Alzheimer's disease. Lauren Manning was fully amnestic across all memory tasks and largely performed poorly across recognition trials, suggesting a memory storage deficit which is the hallmark characteristic of this condition. Impairments in confrontation naming, semantic fluency, and executive functioning, as well as variability across visuospatial abilities, are further consistent with this condition.  Lauren Manning' telephone feedback was conducted with her daughter Lauren Manning. She was within her work environment while I was within my office. I discussed the limitations of evaluation and management by telemedicine and the availability of in  person appointments. Lauren Manning expressed her understanding and agreed to proceed. Content of the current session focused on the results of her mother's neuropsychological evaluation. Lauren Manning was given the opportunity to ask questions and her questions were answered. She was encouraged to reach out should additional questions arise. A copy of her report was mailed at the conclusion of the visit.      Less than 16 minutes were spent conducting the current feedback session with Lauren Manning' daughter.

## 2020-10-04 ENCOUNTER — Other Ambulatory Visit: Payer: Self-pay | Admitting: Physician Assistant

## 2020-10-04 DIAGNOSIS — G479 Sleep disorder, unspecified: Secondary | ICD-10-CM

## 2020-10-04 DIAGNOSIS — K580 Irritable bowel syndrome with diarrhea: Secondary | ICD-10-CM

## 2020-11-16 ENCOUNTER — Other Ambulatory Visit: Payer: Self-pay | Admitting: Physician Assistant

## 2020-11-16 DIAGNOSIS — E7800 Pure hypercholesterolemia, unspecified: Secondary | ICD-10-CM

## 2020-11-16 DIAGNOSIS — K580 Irritable bowel syndrome with diarrhea: Secondary | ICD-10-CM

## 2020-11-16 DIAGNOSIS — G479 Sleep disorder, unspecified: Secondary | ICD-10-CM

## 2020-11-28 ENCOUNTER — Other Ambulatory Visit: Payer: Self-pay | Admitting: Physician Assistant

## 2020-11-28 DIAGNOSIS — E7800 Pure hypercholesterolemia, unspecified: Secondary | ICD-10-CM

## 2020-11-28 NOTE — Telephone Encounter (Signed)
Requested medication (s) are due for refill today: yes  Requested medication (s) are on the active medication list: yes  Last refill:  11/16/20 #30 0 refills  Future visit scheduled: no   Notes to clinic:  please send in 90 DS per insurance     Requested Prescriptions  Pending Prescriptions Disp Refills   lovastatin (MEVACOR) 40 MG tablet [Pharmacy Med Name: LOVASTATIN 40 MG TAB] 30 tablet 0    Sig: TAKE 1 TABLET BY MOUTH EVERY NIGHT AT. BEDTIME. SCHEDULE FOLLOW UP APPOINTMENT      Cardiovascular:  Antilipid - Statins Failed - 11/28/2020  4:25 PM      Failed - Total Cholesterol in normal range and within 360 days    Cholesterol, Total  Date Value Ref Range Status  09/06/2019 186 100 - 199 mg/dL Final          Failed - LDL in normal range and within 360 days    LDL Chol Calc (NIH)  Date Value Ref Range Status  09/06/2019 106 (H) 0 - 99 mg/dL Final          Failed - HDL in normal range and within 360 days    HDL  Date Value Ref Range Status  09/06/2019 45 >39 mg/dL Final          Failed - Triglycerides in normal range and within 360 days    Triglycerides  Date Value Ref Range Status  09/06/2019 201 (H) 0 - 149 mg/dL Final          Passed - Patient is not pregnant      Passed - Valid encounter within last 12 months    Recent Outpatient Visits           8 months ago Right leg pain   [facility] [place], [place], [place]   2 years ago Acute left-sided low back pain with left-sided sciatica   [facility] [place], [place], [place]   3 years ago Late onset Alzheimer's disease without behavioral disturbance   [facility] [place], [place], [place]   3 years ago Annual physical exam   [facility] [place], [place], [place]   4 years ago Memory loss   [facility], [place], [place]

## 2020-12-17 ENCOUNTER — Other Ambulatory Visit: Payer: Self-pay | Admitting: Physician Assistant

## 2020-12-17 DIAGNOSIS — I10 Essential (primary) hypertension: Secondary | ICD-10-CM

## 2020-12-17 DIAGNOSIS — G479 Sleep disorder, unspecified: Secondary | ICD-10-CM

## 2020-12-17 DIAGNOSIS — M1990 Unspecified osteoarthritis, unspecified site: Secondary | ICD-10-CM

## 2020-12-17 DIAGNOSIS — E7800 Pure hypercholesterolemia, unspecified: Secondary | ICD-10-CM

## 2020-12-17 DIAGNOSIS — K580 Irritable bowel syndrome with diarrhea: Secondary | ICD-10-CM

## 2020-12-17 NOTE — Telephone Encounter (Signed)
Requested medication (s) are due for refill today: yes  Requested medication (s) are on the active medication list: yes  Last refill:  09/01/2020  Future visit scheduled: no  Notes to clinic: Patient due for appointment  Patient has not schedule appt yet    Requested Prescriptions  Pending Prescriptions Disp Refills   amLODipine (NORVASC) 10 MG tablet [Pharmacy Med Name: AMLODIPINE BESYLATE 10 MG TAB] 90 tablet 3    Sig: TAKE 1 TABLET BY MOUTH ONCE A DAY      Cardiovascular:  Calcium Channel Blockers Failed - 12/17/2020  2:49 PM      Failed - Last BP in normal range    BP Readings from Last 1 Encounters:  06/05/20 (!) 157/92          Failed - Valid encounter within last 6 months    Recent Outpatient Visits           9 months ago Right leg pain   [facility] [place], [place], [place]   2 years ago Acute left-sided low back pain with left-sided sciatica   [facility] [place], [place], [place]   3 years ago Late onset Alzheimer's disease without behavioral disturbance   [facility] [place], [place], [place]   3 years ago Annual physical exam   [facility] [place], [place]   4 years ago Memory loss   [facility] [place], [place]                  lovastatin (MEVACOR) 40 MG tablet [Pharmacy Med Name: LOVASTATIN 40 MG TAB] 30 tablet 0    Sig: TAKE 1 TABLET BY MOUTH EVERY NIGHT AT. BEDTIME. SCHEDULE FOLLOW UP APPOINTMENT      Cardiovascular:  Antilipid - Statins Failed - 12/17/2020  2:49 PM      Failed - Total Cholesterol in normal range and within 360 days    Cholesterol, Total  Date Value Ref Range Status  09/06/2019 186 100 - 199 mg/dL Final          Failed - LDL in normal range and within 360 days    LDL Chol Calc (NIH)  Date Value Ref Range Status  09/06/2019 106 (H) 0 - 99 mg/dL Final          Failed - HDL in normal range and within 360 days    HDL  Date  Value Ref Range Status  09/06/2019 45 >39 mg/dL Final          Failed - Triglycerides in normal range and within 360 days    Triglycerides  Date Value Ref Range Status  09/06/2019 201 (H) 0 - 149 mg/dL Final          Passed - Patient is not pregnant      Passed - Valid encounter within last 12 months    Recent Outpatient Visits           9 months ago Right leg pain   [facility] [place], [place], [place]   2 years ago Acute left-sided low back pain with left-sided sciatica   [facility] [place], [place], [place]   3 years ago Late onset Alzheimer's disease without behavioral disturbance   [facility] [place], [place], [place]   3 years ago Annual physical exam   [facility] [place], [place], [place]   4 years ago Memory loss   [facility], [place], [place]

## 2020-12-17 NOTE — Telephone Encounter (Signed)
Attempted to call patient to schedule appointment- left message on voicemail to call office. 30 day courtesy RF given for medications not given courtesy in past- 1 Rx refused-needs appointment per notes

## 2020-12-18 ENCOUNTER — Telehealth: Payer: Self-pay | Admitting: Physician Assistant

## 2020-12-18 DIAGNOSIS — I10 Essential (primary) hypertension: Secondary | ICD-10-CM

## 2020-12-18 DIAGNOSIS — E7800 Pure hypercholesterolemia, unspecified: Secondary | ICD-10-CM

## 2020-12-18 NOTE — Telephone Encounter (Signed)
Pt's daughter called requesting refill of 3 medications that were not filled at [facility], looks like Amlodipine and Lovastatin were not filled. She states there is a third as well that she does not know the name. Requesting refill to last until scheduled appt time. Please advise

## 2020-12-19 MED ORDER — LOVASTATIN 40 MG PO TABS: 40.0000 mg | ORAL_TABLET | Freq: Every day | ORAL | 0 refills | Status: DC

## 2020-12-19 MED ORDER — AMLODIPINE BESYLATE 10 MG PO TABS: 10.0000 mg | ORAL_TABLET | Freq: Every day | ORAL | 0 refills | Status: DC

## 2020-12-21 ENCOUNTER — Other Ambulatory Visit: Payer: Self-pay | Admitting: Physician Assistant

## 2020-12-21 DIAGNOSIS — G479 Sleep disorder, unspecified: Secondary | ICD-10-CM

## 2020-12-21 DIAGNOSIS — K580 Irritable bowel syndrome with diarrhea: Secondary | ICD-10-CM

## 2020-12-21 NOTE — Telephone Encounter (Signed)
   Notes to clinic:  Patient has appt on 01/14/2021 Review for refill until that time   Requested Prescriptions  Pending Prescriptions Disp Refills   nortriptyline (PAMELOR) 25 MG capsule [Pharmacy Med Name: NORTRIPTYLINE HCL 25 MG CAP] 30 capsule 0    Sig: TAKE 1 CAPSULE BY MOUTH EVERY NIGHT AT BEDTIME MUST HAVE APPT BEFORE FURTHER REFILLS.      Psychiatry:  Antidepressants - Heterocyclics (TCAs) Failed - 12/21/2020  2:35 PM      Failed - Valid encounter within last 6 months    Recent Outpatient Visits           9 months ago Right leg pain   [facility] [place], [place], [place]   2 years ago Acute left-sided low back pain with left-sided sciatica   [facility] [place], [place], [place]   3 years ago Late onset Alzheimer's disease without behavioral disturbance   [facility], [place], [place]   3 years ago Annual physical exam   [facility] [place], [place], [place]   4 years ago Memory loss   [facility], [place], [place]       Future Appointments             In 3 weeks Bacigalupo, Marzella Schlein, MD [facility], [facility]

## 2021-01-09 ENCOUNTER — Telehealth: Payer: Self-pay

## 2021-01-09 NOTE — Telephone Encounter (Signed)
FYI-Called patient to reschedule her appointment with Dr. B for this coming Monday 04/25 spoke with daughter [name] who takes care of her and is on the [facility]. [name] states that she took that day off to bring her mother and asked if another doctor had some availabilities I offered a 10:40 that day with Dr. Reece Agar she was not able to do that time. She also asked for the next day 04/26 no appointment available. Per daughter she is going to call back once she checks her calendar at work but stated her mother is almost due for refills and is hoping the refills get send in. She is going to call back once she checks with [facility] pharmacy what refills her mother needs to request them.

## 2021-01-14 ENCOUNTER — Ambulatory Visit: Payer: Medicare (Managed Care) | Admitting: Family Medicine

## 2021-01-15 ENCOUNTER — Other Ambulatory Visit: Payer: Self-pay | Admitting: Physician Assistant

## 2021-01-15 DIAGNOSIS — M1990 Unspecified osteoarthritis, unspecified site: Secondary | ICD-10-CM

## 2021-01-15 DIAGNOSIS — E7800 Pure hypercholesterolemia, unspecified: Secondary | ICD-10-CM

## 2021-01-15 DIAGNOSIS — G479 Sleep disorder, unspecified: Secondary | ICD-10-CM

## 2021-01-15 DIAGNOSIS — K580 Irritable bowel syndrome with diarrhea: Secondary | ICD-10-CM

## 2021-01-15 DIAGNOSIS — I10 Essential (primary) hypertension: Secondary | ICD-10-CM

## 2021-01-15 NOTE — Telephone Encounter (Signed)
Will wait on call back to clarify which prescriptions need to be filled. KW

## 2021-01-15 NOTE — Telephone Encounter (Signed)
We can give refills to hold her over to what ever appointment she is able to make.  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

## 2021-01-16 NOTE — Telephone Encounter (Signed)
Last refill:  12/19/2020   Notes to clinic: courtesy refill already given and no appt scheduled    Requested Prescriptions  Pending Prescriptions Disp Refills   diclofenac (VOLTAREN) 50 MG EC tablet [Pharmacy Med Name: DICLOFENAC SODIUM 50 MG DR TAB] 60 tablet 0    Sig: TAKE 1 TABLET BY MOUTH TWICE A DAY      Analgesics:  NSAIDS Failed - 01/15/2021  4:42 PM      Failed - Cr in normal range and within 360 days    Creatinine  Date Value Ref Range Status  08/18/2013 0.97 0.60 - 1.30 mg/dL Final   Creatinine, Ser  Date Value Ref Range Status  09/06/2019 1.25 (H) 0.57 - 1.00 mg/dL Final          Failed - HGB in normal range and within 360 days    Hemoglobin  Date Value Ref Range Status  09/06/2019 16.4 (H) 11.1 - 15.9 g/dL Final          Passed - Patient is not pregnant      Passed - Valid encounter within last 12 months    Recent Outpatient Visits           10 months ago Right leg pain   [facility] [place], [place], [place]   2 years ago Acute left-sided low back pain with left-sided sciatica   [facility] [place], [place], [place]   3 years ago Late onset Alzheimer's disease without behavioral disturbance   [employer], [place], [place]   3 years ago Annual physical exam   [facility] [place], [place]   4 years ago Memory loss   [facility] [place], [facility]                  amLODipine (NORVASC) 10 MG tablet [Pharmacy Med Name: AMLODIPINE BESYLATE 10 MG TAB] 30 tablet 0    Sig: TAKE 1 TABLET BY MOUTH ONCE A DAY - PLEASE KEEP APPOINTMENT IN APRIL      Cardiovascular:  Calcium Channel Blockers Failed - 01/15/2021  4:42 PM      Failed - Last BP in normal range    BP Readings from Last 1 Encounters:  06/05/20 (!) 157/92          Failed - Valid encounter within last 6 months    Recent Outpatient Visits           10 months ago Right leg pain    [facility] [place], [place], [place]   2 years ago Acute left-sided low back pain with left-sided sciatica   [facility] [place], [place], [place]   3 years ago Late onset Alzheimer's disease without behavioral disturbance   [facility] [place], [place], [place]   3 years ago Annual physical exam   [facility] [place], [place], [place]   4 years ago Memory loss   [facility] [place], [place]                  nortriptyline (PAMELOR) 25 MG capsule [Pharmacy Med Name: NORTRIPTYLINE HCL 25 MG CAP] 30 capsule 0    Sig: TAKE 1 CAPSULE BY MOUTH EVERY NIGHT AT BEDTIME MUST HAVE APPT BEFORE FURTHER REFILLS.      Psychiatry:  Antidepressants - Heterocyclics (TCAs) Failed - 01/15/2021  4:42 PM      Failed - Valid encounter within last 6 months  Recent Outpatient Visits           10 months ago Right leg pain   [facility] [place], [place], [place]   2 years ago Acute left-sided low back pain with left-sided sciatica    [facility], [place], [place]   3 years ago Late onset Alzheimer's disease without behavioral disturbance   [facility] [place], [place], [place]   3 years ago Annual physical exam   [facility] [place], [place]   4 years ago Memory loss   [facility] [place], [place]                  lovastatin (MEVACOR) 40 MG tablet [Pharmacy Med Name: LOVASTATIN 40 MG TAB] 30 tablet 0    Sig: TAKE 1 TABLET BY MOUTH EVERY NIGHT AT. BEDTIME. PLEASE KEEP APPOINTMENT IN APRIL      Cardiovascular:  Antilipid - Statins Failed - 01/15/2021  4:42 PM      Failed - Total Cholesterol in normal range and within 360 days    Cholesterol, Total  Date Value Ref Range Status  09/06/2019 186 100 - 199 mg/dL Final          Failed - LDL in normal range and within 360 days    LDL Chol Calc (NIH)   Date Value Ref Range Status  09/06/2019 106 (H) 0 - 99 mg/dL Final          Failed - HDL in normal range and within 360 days    HDL  Date Value Ref Range Status  09/06/2019 45 >39 mg/dL Final          Failed - Triglycerides in normal range and within 360 days    Triglycerides  Date Value Ref Range Status  09/06/2019 201 (H) 0 - 149 mg/dL Final          Passed - Patient is not pregnant      Passed - Valid encounter within last 12 months    Recent Outpatient Visits           10 months ago Right leg pain   [facility] [place], [place], [place]   2 years ago Acute left-sided low back pain with left-sided sciatica   [facility] [place], [place], [place]   3 years ago Late onset Alzheimer's disease without behavioral disturbance   [employer], [place], [place]   3 years ago Annual physical exam   [facility] [place], [place]   4 years ago Memory loss   [facility] [place], [place]                  hydrochlorothiazide (MICROZIDE) 12.5 MG capsule [Pharmacy Med Name: HYDROCHLOROTHIAZIDE 12.5 MG CAP] 30 capsule 0    Sig: TAKE 1 CAPSULE BY MOUTH ONCE DAILY      Cardiovascular: Diuretics - Thiazide Failed - 01/15/2021  4:42 PM      Failed - Ca in normal range and within 360 days    Calcium  Date Value Ref Range Status  09/06/2019 10.0 8.7 - 10.3 mg/dL Final   Calcium, Total  Date Value Ref Range Status  08/18/2013 9.9 8.5 - 10.1 mg/dL Final          Failed - Cr in normal range and within 360 days    Creatinine  Date Value Ref Range Status  08/18/2013 0.97 0.60 - 1.30 mg/dL  Final   Creatinine, Ser  Date Value Ref Range Status  09/06/2019 1.25 (H) 0.57 - 1.00 mg/dL Final          Failed - K in normal range and within 360 days    Potassium  Date Value Ref Range Status  09/06/2019 4.1 3.5 - 5.2 mmol/L Final  08/18/2013 3.2 (L) 3.5 - 5.1 mmol/L Final           Failed - Na in normal range and within 360 days    Sodium  Date Value Ref Range Status  09/06/2019 144 134 - 144 mmol/L Final  08/18/2013 137 136 - 145 mmol/L Final          Failed - Last BP in normal range    BP Readings from Last 1 Encounters:  06/05/20 (!) 157/92          Failed - Valid encounter within last 6 months    Recent Outpatient Visits           10 months ago Right leg pain   [facility] [place], [place], [place]   2 years ago Acute left-sided low back pain with left-sided sciatica   [facility] [place], [place], [place]   3 years ago Late onset Alzheimer's disease without behavioral disturbance   [facility] [place], [place], [place]   3 years ago Annual physical exam   [facility] [place], [place], [place]   4 years ago Memory loss   [facility], [place], [place]

## 2021-01-18 NOTE — Telephone Encounter (Signed)
Prescriptions have been sent in. Renette Butters

## 2021-01-22 ENCOUNTER — Ambulatory Visit: Payer: Medicare Other | Admitting: Neurology

## 2021-01-22 ENCOUNTER — Other Ambulatory Visit: Payer: Self-pay

## 2021-01-22 ENCOUNTER — Encounter: Payer: Self-pay | Admitting: Neurology

## 2021-01-22 VITALS — BP 146/80 | HR 83 | Ht 62.0 in | Wt 137.6 lb

## 2021-01-22 DIAGNOSIS — G301 Alzheimer's disease with late onset: Secondary | ICD-10-CM | POA: Diagnosis not present

## 2021-01-22 DIAGNOSIS — F0280 Dementia in other diseases classified elsewhere without behavioral disturbance: Secondary | ICD-10-CM

## 2021-01-22 MED ORDER — RIVASTIGMINE TARTRATE 3 MG PO CAPS: 3.0000 mg | ORAL_CAPSULE | Freq: Two times a day (BID) | ORAL | 3 refills | Status: DC

## 2021-01-22 NOTE — Patient Instructions (Signed)
Good to see you! Continue Rivastigmine 3mg twice a day.   Follow-up in 6 months, call for any changes   FALL PRECAUTIONS: Be cautious when walking. Scan the area for obstacles that may increase the risk of trips and falls. When getting up in the mornings, sit up at the edge of the bed for a few minutes before getting out of bed. Consider elevating the bed at the head end to avoid drop of blood pressure when getting up. Walk always in a well-lit room (use night lights in the walls). Avoid area rugs or power cords from appliances in the middle of the walkways. Use a walker or a cane if necessary and consider physical therapy for balance exercise. Get your eyesight checked regularly.   HOME SAFETY: Consider the safety of the kitchen when operating appliances like stoves, microwave oven, and blender. Consider having supervision and share cooking responsibilities until no longer able to participate in those. Accidents with firearms and other hazards in the house should be identified and addressed as well.  ABILITY TO BE LEFT ALONE: If patient is unable to contact 911 operator, consider using LifeLine, or when the need is there, arrange for someone to stay with patients. Smoking is a fire hazard, consider supervision or cessation. Risk of wandering should be assessed by caregiver and if detected at any point, supervision and safe proof recommendations should be instituted.  MEDICATION SUPERVISION: Inability to self-administer medication needs to be constantly addressed. Implement a mechanism to ensure safe administration of the medications.  RECOMMENDATIONS FOR ALL PATIENTS WITH MEMORY PROBLEMS: 1. Continue to exercise (Recommend 30 minutes of walking everyday, or 3 hours every week) 2. Increase social interactions - continue going to Church and enjoy social gatherings with friends and family 3. Eat healthy, avoid fried foods and eat more fruits and vegetables 4. Maintain adequate blood pressure, blood  sugar, and blood cholesterol level. Reducing the risk of stroke and cardiovascular disease also helps promoting better memory. 5. Avoid stressful situations. Live a simple life and avoid aggravations. Organize your time and prepare for the next day in anticipation. 6. Sleep well, avoid any interruptions of sleep and avoid any distractions in the bedroom that may interfere with adequate sleep quality 7. Avoid sugar, avoid sweets as there is a strong link between excessive sugar intake, diabetes, and cognitive impairment The Mediterranean diet has been shown to help patients reduce the risk of progressive memory disorders and reduces cardiovascular risk. This includes eating fish, eat fruits and green leafy vegetables, nuts like almonds and hazelnuts, walnuts, and also use olive oil. Avoid fast foods and fried foods as much as possible. Avoid sweets and sugar as sugar use has been linked to worsening of memory function.  There is always a concern of gradual progression of memory problems. If this is the case, then we may need to adjust level of care according to patient needs. Support, both to the patient and caregiver, should then be put into place.  

## 2021-01-22 NOTE — Progress Notes (Signed)
NEUROLOGY FOLLOW UP OFFICE NOTE  Lauren Manning [id number] September 16, 1936  HISTORY OF PRESENT ILLNESS: I had the pleasure of seeing Lauren Manning in follow-up in the neurology clinic on 01/22/2021.  The patient was last seen 8 months ago for dementia. She is again accompanied by her daughter Lauren Manning who helps supplement the history today.  Records and images were personally reviewed where available.  She underwent repeat Neuropsychological testing in 08/2020 which showed fairly diffuse cognitive impairment with prominent dysfunction surrounding learning and memory, diagnosis of Major Neurocognitive Disorder, etiology likely Alzheimer's disease. There were performance declines in processing speed, semantic fluency, and visuoconstructional abilities compared to 2018 testing. She had difficulties with memory tasks and largely performed poorly across recognition trials, suggesting a memory storage deficit which is the hallmark characteristic of AD.   Since her last visit, they report she is pretty much the same. She does better with pillpacks, Lauren Manning checks once a week and she misses a dose once or twice. She does not cook as much, there is still a lot of food in the freezer. She does not drive. [employer]. She is independent with dressing and bathing, no hygiene concerns. She messes up the TV and phone frequently, calling for help every 2-3 days. Sleep is good, no wandering behaviors. She gets 8 hours of sleep. No personality changes, paranoia, or hallucinations. She denies any headaches, dizziness, vision changes, focal numbness/tingling/weakness, no falls. No side effects on Rivastigmine 3mg  BID.    History on Initial Assessment 11/23/2019: This is an 85 year old right-handed woman with a history of hypertension, hyperlipidemia, Alzheimer's disease, presenting for evaluation of dementia. Her daughter 94 is present to provide additional information. Family started noticing memory changes  several years ago. She underwent Neuropsychological testing with Dr. Aram Manning in April 2018 with a diagnosis of Mild dementia most likely secondary to Alzheimer's disease, without behavioral disturbance, Mild depression. Cognitive profile is suggestive of medial-temporal lobe involvement. She had been on Donepezil 5mg  daily, dose was increased to 10mg  daily. It was noted that she should continue to receive assistance with complex ADLs, and would be well suited for a continuing care retirement community or assisted living where she could have increased oversight of medications and meals. She continues to live alone in a senior living apartment complex where her neighbors look out for her. She manages her own medications and denies missing any doses. May 2018 works close by and comes for lunch sometimes, and sees her pillbox would be empty, but states she did not take her medications this morning. One time, a neighbor called, they apparently called EMS because she went to her neighbor saying she thinks she took her medication twice, EMS checked her BP and she seemed fine. She stopped driving in . took over finances in 2014. She denies misplacing things, however 3007 reports that she could not find her cane or keys this morning. She cannot tell Lauren Manning what she ate, and thinks she is eating the same things repeatedly. She has had some weight loss. She denies leaving the stove on. She is independent with dressing and bathing, she is "always cleaning," and house is well-kept. 2015 has noticed that her mood has changed quite a bit, she gets irritated easily. She used to have birds and talked about strangling them because they were so noisy. They got rid of the birds this week. She cusses a lot more than before. No paranoia or hallucinations. She states her mood is okay. She has difficulties  with sleep initiation and was prescribed nortriptyline, but they don't think she filled the prescription.   She  denies any headaches, dizziness, diplopia, dysarthria, dysphagia, neck/back pain, focal numbness/tingling/weakness, bowel dysfunction. No anosmia, tremors. She has had diarrhea for "years" occurring 2-3 times a week. She had a bout this morning. Her daughter reports diverticulitis in the past, and that she still eats dairy because she forgets that she should avoid it. She reports a fall this morning, she lost her balance. No injuries. She usually ambulates with a cane. Her mother had Alzheimer's disease. No history of significant head injuries. No alcohol use.  I personally reviewed head CT without contrast done 11/2017 which did not show any acute changes. There was moderate degree of periventricular and subcortical white matter hypoattenuation compatible with chronic microvascular ischemia. Mild atrophy with ventricular sulcal prominence.  Laboratory Data: Lab Results  Component Value Date   TSH 2.980 11/23/2015    PAST MEDICAL HISTORY: Past Medical History:  Diagnosis Date  . Arteriosclerotic cardiovascular disease 11/21/2015  . Arthritis    Gout  . Arthritis urica 11/21/2015  . Baden-Walker grade 3 cystocele 05/19/2016  . Cataract 11/21/2015  . Cervical prolapse 11/21/2015  . DD (diverticular disease) 11/21/2015  . Essential (primary) hypertension 11/21/2015  . GERD (gastroesophageal reflux disease)   . Hereditary hemochromatosis 06/09/2016  . History of colonic polyps   . HOH (hard of hearing)   . Hypercholesteremia 11/21/2015  . Hyperglycemia   . Hyperlipidemia   . Insomnia 11/21/2015  . Low back pain 11/21/2015  . Major neurocognitive disorder due to Alzheimer's disease, without behavioral disturbance 11/21/2015  . Multiple contusions of trunk   . Neck pain 12/12/2014  . Osteopenia     MEDICATIONS: Current Outpatient Medications on File Prior to Visit  Medication Sig Dispense Refill  . amLODipine (NORVASC) 10 MG tablet Take 1 tablet (10 mg total) by mouth daily. 30 tablet 2  . aspirin 81 MG  tablet Take 81 mg by mouth daily.    Marland Kitchen conjugated estrogens (PREMARIN) vaginal cream Place 1 Applicatorful vaginally 2 (two) times a week. Insert 0.5 mg of cream vaginally twice weekly 42.5 g 12  . diclofenac (VOLTAREN) 50 MG EC tablet Take 1 tablet (50 mg total) by mouth 2 (two) times daily as needed. 60 tablet 1  . hydrochlorothiazide (MICROZIDE) 12.5 MG capsule Take 1 capsule (12.5 mg total) by mouth daily. 30 capsule 2  . lovastatin (MEVACOR) 40 MG tablet Take 1 tablet (40 mg total) by mouth every evening. 30 tablet 1  . meloxicam (MOBIC) 7.5 MG tablet Take 1 tablet (7.5 mg total) by mouth daily. 30 tablet 3  . metoprolol succinate (TOPROL-XL) 25 MG 24 hr tablet TAKE 1 TABLET BY MOUTH ONCE A DAY 90 tablet 3  . nortriptyline (PAMELOR) 25 MG capsule Take 1 capsule (25 mg total) by mouth at bedtime. 30 capsule 0  . rivastigmine (EXELON) 3 MG capsule Take 1 capsule (3 mg total) by mouth 2 (two) times daily. 180 capsule 6   No current facility-administered medications on file prior to visit.    ALLERGIES: Allergies  Allergen Reactions  . Enalapril Other (See Comments)    Pt. Denies this allergy Other reaction(s): Unknown  . Penicillins     Has patient had a PCN reaction causing immediate rash, facial/tongue/throat swelling, SOB or lightheadedness with hypotension:unsure Has patient had a PCN reaction causing severe rash involving mucus membranes or skin necrosis:unsure Has patient had a PCN reaction that required hospitalization:unsure Has  patient had a PCN reaction occurring within the last 10 years:unsure If all of the above answers are "NO", then may proceed with Cephalosporin use.  Other reaction(s): Unknown    FAMILY HISTORY: Family History  Problem Relation Age of Onset  . Dementia Mother   . Prostate cancer Father   . Breast cancer Daughter 91       breast cancer    SOCIAL HISTORY: Social History   Socioeconomic History  . Marital status: Single    Spouse name: Not on  file  . Number of children: Not on file  . Years of education: 51  . Highest education level: High school graduate  Occupational History  . Occupation: Retired  Tobacco Use  . Smoking status: Never Smoker  . Smokeless tobacco: Never Used  Vaping Use  . Vaping Use: Never used  Substance and Sexual Activity  . Alcohol use: No  . Drug use: No  . Sexual activity: Not Currently    Birth control/protection: None  Other Topics Concern  . Not on file  Social History Narrative   Right handed    Lives alone   One story home no steps    Social Determinants of Health   Financial Resource Strain: Not on file  Food Insecurity: Not on file  Transportation Needs: Not on file  Physical Activity: Not on file  Stress: Not on file  Social Connections: Not on file  Intimate Partner Violence: Not on file     PHYSICAL EXAM: Vitals:   01/22/21 1551  BP: (!) 146/80  Pulse: 83  SpO2: 97%   General: No acute distress Head:  Normocephalic/atraumatic Skin/Extremities: No rash, no edema Neurological Exam: alert and oriented to person, month. Year is 2002, city is [place]. No aphasia or dysarthria. Fund of knowledge is appropriate.  Recent and remote memory are impaired. Attention and concentration are reduced, 0/5 WORLD backwards. Cranial nerves: Pupils equal, round. Extraocular movements intact with no nystagmus. Visual fields full.  No facial asymmetry.  Motor: Bulk and tone normal, muscle strength 5/5 throughout with no pronator drift.   Finger to nose testing intact.  Gait narrow-based and steady, no ataxia   IMPRESSION: This is an 85 yo RH woman with a history of hypertension, hyperlipidemia, and mild dementia secondary to Alzheimer's disease. Repeat Neuropsychological evaluation showed decline from 2018. Symptoms overall stable, continue Rivastigmine 3mg  BID. Continue to monitor medication compliance. Continue close supervision. She does not drive. We again discussed the importance of  control of vascular risk factors, physical exercise, and brain stimulation exercises for brain health. Follow-up in 6 months, call for any changes.    Thank you for allowing me to participate in her care.  Please do not hesitate to call for any questions or concerns.   , M.D.   CC: Patrcia Dolly, PA-C

## 2021-02-13 ENCOUNTER — Other Ambulatory Visit: Payer: Self-pay | Admitting: Family Medicine

## 2021-02-13 DIAGNOSIS — K580 Irritable bowel syndrome with diarrhea: Secondary | ICD-10-CM

## 2021-02-13 DIAGNOSIS — G479 Sleep disorder, unspecified: Secondary | ICD-10-CM

## 2021-02-13 NOTE — Telephone Encounter (Signed)
Requested medication (s) are due for refill today - yes  Requested medication (s) are on the active medication list -yes  Future visit scheduled -no  Last refill: 01/17/21  Notes to clinic: Duplicate request for denied refill- per last RF notes. Attempted to call patient to schedule- left message on VM to call office- request sent for review.  Requested Prescriptions  Pending Prescriptions Disp Refills   nortriptyline (PAMELOR) 25 MG capsule [Pharmacy Med Name: NORTRIPTYLINE HCL 25 MG CAP] 30 capsule 0    Sig: TAKE 1 CAPSULE BY MOUTH EVERY NIGHT AT BEDTIME MUST HAVE APPT BEFORE FURTHER REFILLS.      Psychiatry:  Antidepressants - Heterocyclics (TCAs) Failed - 02/13/2021  2:27 PM      Failed - Valid encounter within last 6 months    Recent Outpatient Visits           11 months ago Right leg pain   [facility] [place], [place], [place]   2 years ago Acute left-sided low back pain with left-sided sciatica   [facility] [place], [place], [place]   3 years ago Late onset Alzheimer's disease without behavioral disturbance   [facility], [place], [place]   3 years ago Annual physical exam   [facility] [place], [place], [place]   4 years ago Memory loss   [facility] [place], [place]                    Requested Prescriptions  Pending Prescriptions Disp Refills   nortriptyline (PAMELOR) 25 MG capsule [Pharmacy Med Name: NORTRIPTYLINE HCL 25 MG CAP] 30 capsule 0    Sig: TAKE 1 CAPSULE BY MOUTH EVERY NIGHT AT BEDTIME MUST HAVE APPT BEFORE FURTHER REFILLS.      Psychiatry:  Antidepressants - Heterocyclics (TCAs) Failed - 02/13/2021  2:27 PM      Failed - Valid encounter within last 6 months    Recent Outpatient Visits           11 months ago Right leg pain   [facility] [place], [place], [place]   2 years ago Acute left-sided low back pain with left-sided  sciatica   [facility] [place], [place], [place]   3 years ago Late onset Alzheimer's disease without behavioral disturbance   [facility], [place], [place]   3 years ago Annual physical exam   [facility] [place], [place], [place]   4 years ago Memory loss   [facility], [place], [place]

## 2021-03-11 ENCOUNTER — Other Ambulatory Visit: Payer: Self-pay

## 2021-03-11 ENCOUNTER — Encounter: Payer: Self-pay | Admitting: Family Medicine

## 2021-03-11 ENCOUNTER — Ambulatory Visit (INDEPENDENT_AMBULATORY_CARE_PROVIDER_SITE_OTHER): Payer: Medicare Other | Admitting: Family Medicine

## 2021-03-11 VITALS — BP 155/76 | HR 79 | Wt 137.0 lb

## 2021-03-11 DIAGNOSIS — E7800 Pure hypercholesterolemia, unspecified: Secondary | ICD-10-CM | POA: Diagnosis not present

## 2021-03-11 DIAGNOSIS — I10 Essential (primary) hypertension: Secondary | ICD-10-CM | POA: Diagnosis not present

## 2021-03-11 DIAGNOSIS — G301 Alzheimer's disease with late onset: Secondary | ICD-10-CM

## 2021-03-11 DIAGNOSIS — F0280 Dementia in other diseases classified elsewhere without behavioral disturbance: Secondary | ICD-10-CM

## 2021-03-11 NOTE — Progress Notes (Signed)
Established patient visit   Patient: Lauren Manning   DOB: 03-30-1936   85 y.o. Female  MRN: [id number] Visit Date: 03/11/2021  Today's healthcare provider: Dortha Kern, PA-C   No chief complaint on file.  Subjective    HPI  Patient is an 85 year old female who presents for follow up of chronic health and to get refills on medications.  She is a former patient of Daiva Nakayama, [place] and was last seen in June 2021.   Hypertension, follow-up  BP Readings from Last 3 Encounters:  03/11/21 (!) 155/76  01/22/21 (!) 146/80  06/05/20 (!) 157/92   Wt Readings from Last 3 Encounters:  03/11/21 137 lb (62.1 kg)  01/22/21 137 lb 9.6 oz (62.4 kg)  06/05/20 146 lb 6.4 oz (66.4 kg)     She was last seen for hypertension 2 months ago.  BP at that visit was as above. Management since that visit includes none.  She reports good compliance with treatment. She is not having side effects.  She is following a Regular diet. She is not exercising. She does not smoke.  Use of agents associated with hypertension: none.   Outside blood pressures are no being checked. Symptoms: No chest pain No chest pressure  No palpitations No syncope  No dyspnea No orthopnea  No paroxysmal nocturnal dyspnea No lower extremity edema   Pertinent labs: Lab Results  Component Value Date   CHOL 186 09/06/2019   HDL 45 09/06/2019   LDLCALC 106 (H) 09/06/2019   TRIG 201 (H) 09/06/2019   CHOLHDL 4.1 09/06/2019   Lab Results  Component Value Date   NA 144 09/06/2019   K 4.1 09/06/2019   CREATININE 1.25 (H) 09/06/2019   GFRNONAA 40 (L) 09/06/2019   GFRAA 47 (L) 09/06/2019   GLUCOSE 103 (H) 09/06/2019     The ASCVD Risk score Denman George DC Jr., et al., 2013) failed to calculate for the following reasons:   The 2013 ASCVD risk score is only valid for ages 25 to 4   --------------------------------------------------------------------------------------------------- Lipid/Cholesterol,  Follow-up  Last lipid panel Other pertinent labs  Lab Results  Component Value Date   CHOL 186 09/06/2019   HDL 45 09/06/2019   LDLCALC 106 (H) 09/06/2019   TRIG 201 (H) 09/06/2019   CHOLHDL 4.1 09/06/2019   Lab Results  Component Value Date   ALT 23 09/06/2019   AST 21 09/06/2019   PLT 272 09/06/2019   TSH 2.980 11/23/2015     She was last seen for this 12 months ago.  Management since that visit includes none. She reports good compliance with treatment. She is not having side effects.   Symptoms: No chest pain No chest pressure/discomfort  No dyspnea No lower extremity edema  No numbness or tingling of extremity No orthopnea  No palpitations No paroxysmal nocturnal dyspnea  No speech difficulty No syncope     The ASCVD Risk score Denman George DC Jr., et al., 2013) failed to calculate for the following reasons:   The 2013 ASCVD risk score is only valid for ages 1 to 62  ---------------------------------------------------------------------------------------------------  Past Medical History:  Diagnosis Date   Arteriosclerotic cardiovascular disease 11/21/2015   Arthritis    Gout   Arthritis urica 11/21/2015   Baden-Walker grade 3 cystocele 05/19/2016   Cataract 11/21/2015   Cervical prolapse 11/21/2015   DD (diverticular disease) 11/21/2015   Essential (primary) hypertension 11/21/2015   GERD (gastroesophageal reflux disease)    Hereditary hemochromatosis  06/09/2016   History of colonic polyps    HOH (hard of hearing)    Hypercholesteremia 11/21/2015   Hyperglycemia    Hyperlipidemia    Insomnia 11/21/2015   Low back pain 11/21/2015   Major neurocognitive disorder due to Alzheimer's disease, without behavioral disturbance 11/21/2015   Multiple contusions of trunk    Neck pain 12/12/2014   Osteopenia    Past Surgical History:  Procedure Laterality Date   ABDOMINAL HYSTERECTOMY     CATARACT EXTRACTION W/PHACO Left 07/22/2016   Procedure: CATARACT EXTRACTION PHACO AND INTRAOCULAR  LENS PLACEMENT (IOC);  Surgeon: Galen Manila, MD;  Location: [facility] ORS;  Service: Ophthalmology;  Laterality: Left;  [place] 43.1 AP% 20.2 CDE 8.71 Fluid pack lot # [id number]   CHOLECYSTECTOMY  04/16/2004   Dr. Orlene Erm. Anterior and posterior colporrhaphy   COLONOSCOPY WITH PROPOFOL N/A 07/01/2016   Procedure: COLONOSCOPY WITH PROPOFOL;  Surgeon: Midge Minium, MD;  Location: [facility] ENDOSCOPY;  Service: Endoscopy;  Laterality: N/A;   history of colon polyps  1999   PARTIAL HYSTERECTOMY     TONSILLECTOMY     TUBAL LIGATION     Social History   Tobacco Use   Smoking status: Never   Smokeless tobacco: Never  Vaping Use   Vaping Use: Never used  Substance Use Topics   Alcohol use: No   Drug use: No   Family Status  Relation Name Status   Mother  Deceased       Alzheimer's disease   Father  Deceased at age 21       died from congestive heart failure, Myocardial infarction, prostate cancer   Sister  Alive   Daughter  Alive   Allergies  Allergen Reactions   Enalapril Other (See Comments)    Pt. Denies this allergy Other reaction(s): Unknown   Penicillins     Has patient had a PCN reaction causing immediate rash, facial/tongue/throat swelling, SOB or lightheadedness with hypotension:unsure Has patient had a PCN reaction causing severe rash involving mucus membranes or skin necrosis:unsure Has patient had a PCN reaction that required hospitalization:unsure Has patient had a PCN reaction occurring within the last 10 years:unsure If all of the above answers are "NO", then may proceed with Cephalosporin use.  Other reaction(s): Unknown       Medications: Outpatient Medications Prior to Visit  Medication Sig   amLODipine (NORVASC) 10 MG tablet Take 1 tablet (10 mg total) by mouth daily.   aspirin 81 MG tablet Take 81 mg by mouth daily.   diclofenac (VOLTAREN) 50 MG EC tablet Take 1 tablet (50 mg total) by mouth 2 (two) times daily as needed.   hydrochlorothiazide (MICROZIDE) 12.5  MG capsule Take 1 capsule (12.5 mg total) by mouth daily.   lovastatin (MEVACOR) 40 MG tablet Take 1 tablet (40 mg total) by mouth every evening.   metoprolol succinate (TOPROL-XL) 25 MG 24 hr tablet TAKE 1 TABLET BY MOUTH ONCE A DAY   nortriptyline (PAMELOR) 25 MG capsule Take 1 capsule (25 mg total) by mouth at bedtime.   rivastigmine (EXELON) 3 MG capsule Take 1 capsule (3 mg total) by mouth 2 (two) times daily.   conjugated estrogens (PREMARIN) vaginal cream Place 1 Applicatorful vaginally 2 (two) times a week. Insert 0.5 mg of cream vaginally twice weekly (Patient not taking: Reported on 03/11/2021)   No facility-administered medications prior to visit.    Review of Systems     Objective    BP (!) 155/76 (BP Location: Right Arm, Patient  Position: Sitting, Cuff Size: Normal)   Pulse 79   Wt 137 lb (62.1 kg)   SpO2 96%   BMI 25.06 kg/m  BP Readings from Last 3 Encounters:  03/11/21 (!) 155/76  01/22/21 (!) 146/80  06/05/20 (!) 157/92   Wt Readings from Last 3 Encounters:  03/11/21 137 lb (62.1 kg)  01/22/21 137 lb 9.6 oz (62.4 kg)  06/05/20 146 lb 6.4 oz (66.4 kg)       Physical Exam Constitutional:      General: She is not in acute distress.    Appearance: She is well-developed.  HENT:     Head: Normocephalic and atraumatic.     Right Ear: Hearing and tympanic membrane normal.     Left Ear: Hearing and tympanic membrane normal.     Nose: Nose normal.  Eyes:     General: Lids are normal. No scleral icterus.       Right eye: No discharge.        Left eye: No discharge.     Conjunctiva/sclera: Conjunctivae normal.  Cardiovascular:     Rate and Rhythm: Normal rate and regular rhythm.     Pulses: Normal pulses.     Heart sounds: Normal heart sounds.  Pulmonary:     Effort: Pulmonary effort is normal. No respiratory distress.     Breath sounds: Normal breath sounds.  Abdominal:     General: Bowel sounds are normal.     Palpations: Abdomen is soft.   Musculoskeletal:        General: Normal range of motion.     Cervical back: Normal range of motion and neck supple.  Skin:    Findings: No lesion or rash.  Neurological:     Mental Status: She is alert and oriented to person, place, and time.  Psychiatric:        Speech: Speech normal.        Behavior: Behavior normal.        Thought Content: Thought content normal.     No results found for any visits on 03/11/21.  Assessment & Plan     1. Late onset Alzheimer's disease without behavioral disturbance (HCC) Followed by Dr. Karel Jarvis (neurologist) and taking Exelon 3 mg BID. Recheck labs. Good remote memory recall. No irritability today. Very cooperative. - CBC with Differential/Platelet - Comprehensive metabolic panel - Lipid panel  2. Hypercholesteremia Tolerating Lovastatin 40 mg qd with low fat diet. Recheck labs. - Comprehensive metabolic panel - Lipid panel  3. Essential (primary) hypertension Well controlled on the Amlodipine 10 mg qd, HCTZ 12.5 mg qd and Metoprolol Succinate 25 mg qd. Recheck routine labs. - CBC with Differential/Platelet - Comprehensive metabolic panel - Lipid panel   No follow-ups on file.      I,  , PA-C, have reviewed all documentation for this visit. The documentation on 03/11/21 for the exam, diagnosis, procedures, and orders are all accurate and complete.    Dortha Kern, PA-C  [facility] [phone] (phone) [phone] (fax)  [facility]

## 2021-03-14 ENCOUNTER — Other Ambulatory Visit: Payer: Self-pay | Admitting: Family Medicine

## 2021-03-14 DIAGNOSIS — M1990 Unspecified osteoarthritis, unspecified site: Secondary | ICD-10-CM

## 2021-03-14 DIAGNOSIS — G479 Sleep disorder, unspecified: Secondary | ICD-10-CM

## 2021-03-14 DIAGNOSIS — E7800 Pure hypercholesterolemia, unspecified: Secondary | ICD-10-CM

## 2021-03-14 DIAGNOSIS — K580 Irritable bowel syndrome with diarrhea: Secondary | ICD-10-CM

## 2021-04-02 ENCOUNTER — Ambulatory Visit (INDEPENDENT_AMBULATORY_CARE_PROVIDER_SITE_OTHER): Payer: Medicare Other | Admitting: Obstetrics and Gynecology

## 2021-04-02 ENCOUNTER — Other Ambulatory Visit: Payer: Self-pay

## 2021-04-02 ENCOUNTER — Encounter: Payer: Self-pay | Admitting: Obstetrics and Gynecology

## 2021-04-02 VITALS — BP 135/73 | HR 53 | Ht 62.0 in | Wt 136.5 lb

## 2021-04-02 DIAGNOSIS — N8110 Cystocele, unspecified: Secondary | ICD-10-CM | POA: Diagnosis not present

## 2021-04-02 DIAGNOSIS — Z4689 Encounter for fitting and adjustment of other specified devices: Secondary | ICD-10-CM

## 2021-04-02 DIAGNOSIS — N952 Postmenopausal atrophic vaginitis: Secondary | ICD-10-CM | POA: Diagnosis not present

## 2021-04-02 NOTE — Progress Notes (Signed)
GYNECOLOGY PROGRESS NOTE  Subjective:    Patient ID: Lauren Manning, female    DOB: 09-27-1935, 85 y.o.   MRN: [id number]  HPI  Patient is a 85 y.o. G54P2002 female who presents for a pessary check. Last pessary check was June 2021. She reports no vaginal bleeding or discharge. She denies pelvic discomfort and difficulty urinating or moving her bowels.  Notes that she does not use recommended Premarin cream.   The following portions of the patient's history were reviewed and updated as appropriate:  She  has a past medical history of Arteriosclerotic cardiovascular disease (11/21/2015), Arthritis, Arthritis urica (11/21/2015), Baden-Walker grade 3 cystocele (05/19/2016), Cataract (11/21/2015), Cervical prolapse (11/21/2015), DD (diverticular disease) (11/21/2015), Essential (primary) hypertension (11/21/2015), GERD (gastroesophageal reflux disease), Hereditary hemochromatosis (06/09/2016), History of colonic polyps, HOH (hard of hearing), Hypercholesteremia (11/21/2015), Hyperglycemia, Hyperlipidemia, Insomnia (11/21/2015), Low back pain (11/21/2015), Major neurocognitive disorder due to Alzheimer's disease, without behavioral disturbance (11/21/2015), Multiple contusions of trunk, Neck pain (12/12/2014), and Osteopenia.  She  has a past surgical history that includes Tubal ligation; Cholecystectomy (04/16/2004); history of colon polyps (1999); Partial hysterectomy; Colonoscopy with propofol (N/A, 07/01/2016); Abdominal hysterectomy; Tonsillectomy; and Cataract extraction w/PHACO (Left, 07/22/2016).  Her family history includes Breast cancer (age of onset: 77) in her daughter; Dementia in her mother; Prostate cancer in her father.  She  reports that she has never smoked. She has never used smokeless tobacco. She reports that she does not drink alcohol and does not use drugs.  Current Outpatient Medications on File Prior to Visit  Medication Sig Dispense Refill   amLODipine (NORVASC) 10 MG tablet Take 1 tablet (10 mg  total) by mouth daily. 30 tablet 2   aspirin 81 MG tablet Take 81 mg by mouth daily.     conjugated estrogens (PREMARIN) vaginal cream Place 1 Applicatorful vaginally 2 (two) times a week. Insert 0.5 mg of cream vaginally twice weekly 42.5 g 12   diclofenac (VOLTAREN) 50 MG EC tablet TAKE 1 TABLET BY MOUTH TWICE A DAY AS NEEDED 60 tablet 1   hydrochlorothiazide (MICROZIDE) 12.5 MG capsule Take 1 capsule (12.5 mg total) by mouth daily. 30 capsule 2   lovastatin (MEVACOR) 40 MG tablet TAKE 1 TABLET BY MOUTH EVERY NIGHT AT. BEDTIME. PLEASE KEEP APPOINTMENT IN APRIL 30 tablet 1   metoprolol succinate (TOPROL-XL) 25 MG 24 hr tablet TAKE 1 TABLET BY MOUTH ONCE A DAY 90 tablet 3   nortriptyline (PAMELOR) 25 MG capsule TAKE 1 CAPSULE BY MOUTH EVERY NIGHT AT BEDTIME MUST HAVE APPT BEFORE FURTHER REFILLS. 30 capsule 0   rivastigmine (EXELON) 3 MG capsule Take 1 capsule (3 mg total) by mouth 2 (two) times daily. 180 capsule 3   No current facility-administered medications on file prior to visit.   She is allergic to enalapril and penicillins..  Review of Systems A comprehensive review of systems was negative.   Objective:   Blood pressure 135/73, pulse (!) 53, height 5\' 2"  (1.575 m), weight 136 lb 8 oz (61.9 kg). General appearance: alert and no distress Abdomen: soft, non-tender; bowel sounds normal; no masses,  no organomegaly Pelvic: The patient's size 2 3/4 Gelhorn pessary was removed, cleaned and replaced without complications. Speculum examination revealed vaginal mucosa with moderate vaginal atrophy present. Small amount of bleeding with pessary removal.   Assessment:   Encounter for pessary maintenance Cystocele Grade 3 with cervical prolapse Vaginal atrophy  Plan:   - The patient should return in 3-4 months for a pessary  check.    - Strongly encouraged to utilize Premarin cream for atrophy and greater ease of pessary removal.    Hildred Laser, MD [facility]

## 2021-04-02 NOTE — Progress Notes (Signed)
Pt present for pessary check. 

## 2021-04-03 LAB — CBC WITH DIFFERENTIAL/PLATELET
Basophils Absolute: 0.1 10*3/uL (ref 0.0–0.2)
Basos: 1 %
EOS (ABSOLUTE): 0.2 10*3/uL (ref 0.0–0.4)
Eos: 3 %
Hematocrit: 48.7 % — ABNORMAL HIGH (ref 34.0–46.6)
Hemoglobin: 16 g/dL — ABNORMAL HIGH (ref 11.1–15.9)
Immature Grans (Abs): 0 10*3/uL (ref 0.0–0.1)
Immature Granulocytes: 0 %
Lymphocytes Absolute: 1.9 10*3/uL (ref 0.7–3.1)
Lymphs: 34 %
MCH: 29.8 pg (ref 26.6–33.0)
MCHC: 32.9 g/dL (ref 31.5–35.7)
MCV: 91 fL (ref 79–97)
Monocytes Absolute: 0.5 10*3/uL (ref 0.1–0.9)
Monocytes: 9 %
Neutrophils Absolute: 2.9 10*3/uL (ref 1.4–7.0)
Neutrophils: 53 %
Platelets: 279 10*3/uL (ref 150–450)
RBC: 5.37 x10E6/uL — ABNORMAL HIGH (ref 3.77–5.28)
RDW: 12.4 % (ref 11.7–15.4)
WBC: 5.5 10*3/uL (ref 3.4–10.8)

## 2021-04-03 LAB — COMPREHENSIVE METABOLIC PANEL
ALT: 18 IU/L (ref 0–32)
AST: 19 IU/L (ref 0–40)
Albumin/Globulin Ratio: 1.8 (ref 1.2–2.2)
Albumin: 4.4 g/dL (ref 3.6–4.6)
Alkaline Phosphatase: 66 IU/L (ref 44–121)
BUN/Creatinine Ratio: 12 (ref 12–28)
BUN: 15 mg/dL (ref 8–27)
Bilirubin Total: 0.6 mg/dL (ref 0.0–1.2)
CO2: 24 mmol/L (ref 20–29)
Calcium: 9.2 mg/dL (ref 8.7–10.3)
Chloride: 102 mmol/L (ref 96–106)
Creatinine, Ser: 1.24 mg/dL — ABNORMAL HIGH (ref 0.57–1.00)
Globulin, Total: 2.4 g/dL (ref 1.5–4.5)
Glucose: 98 mg/dL (ref 65–99)
Potassium: 3.9 mmol/L (ref 3.5–5.2)
Sodium: 140 mmol/L (ref 134–144)
Total Protein: 6.8 g/dL (ref 6.0–8.5)
eGFR: 43 mL/min/{1.73_m2} — ABNORMAL LOW (ref >59)

## 2021-04-03 LAB — LIPID PANEL
Chol/HDL Ratio: 3.9 ratio (ref 0.0–4.4)
Cholesterol, Total: 159 mg/dL (ref 100–199)
HDL: 41 mg/dL (ref >39)
LDL Chol Calc (NIH): 91 mg/dL (ref 0–99)
Triglycerides: 152 mg/dL — ABNORMAL HIGH (ref 0–149)
VLDL Cholesterol Cal: 27 mg/dL (ref 5–40)

## 2021-04-04 ENCOUNTER — Other Ambulatory Visit: Payer: Self-pay | Admitting: Family Medicine

## 2021-04-04 DIAGNOSIS — K580 Irritable bowel syndrome with diarrhea: Secondary | ICD-10-CM

## 2021-04-04 DIAGNOSIS — I10 Essential (primary) hypertension: Secondary | ICD-10-CM

## 2021-04-04 DIAGNOSIS — G479 Sleep disorder, unspecified: Secondary | ICD-10-CM

## 2021-05-16 ENCOUNTER — Other Ambulatory Visit: Payer: Self-pay | Admitting: Family Medicine

## 2021-05-16 DIAGNOSIS — E7800 Pure hypercholesterolemia, unspecified: Secondary | ICD-10-CM

## 2021-05-16 DIAGNOSIS — M1990 Unspecified osteoarthritis, unspecified site: Secondary | ICD-10-CM

## 2021-05-16 DIAGNOSIS — G479 Sleep disorder, unspecified: Secondary | ICD-10-CM

## 2021-05-16 DIAGNOSIS — K580 Irritable bowel syndrome with diarrhea: Secondary | ICD-10-CM

## 2021-06-25 ENCOUNTER — Other Ambulatory Visit: Payer: Self-pay

## 2021-06-25 ENCOUNTER — Telehealth: Payer: Self-pay | Admitting: Physician Assistant

## 2021-06-25 MED ORDER — RIVASTIGMINE TARTRATE 3 MG PO CAPS: 3.0000 mg | ORAL_CAPSULE | Freq: Two times a day (BID) | ORAL | 0 refills | Status: DC

## 2021-06-25 NOTE — Telephone Encounter (Signed)
Morrie Sheldon from [facility] called in, said pt is out of her exelon. Said she has been trying to reach out a few times and had no luck. Pt has an appt 07/31/21

## 2021-06-25 NOTE — Telephone Encounter (Signed)
Sent refill enough until follow up for exelon till 07/31/2021

## 2021-07-10 ENCOUNTER — Other Ambulatory Visit: Payer: Self-pay | Admitting: Family Medicine

## 2021-07-10 DIAGNOSIS — I10 Essential (primary) hypertension: Secondary | ICD-10-CM

## 2021-07-31 ENCOUNTER — Other Ambulatory Visit: Payer: Self-pay

## 2021-07-31 ENCOUNTER — Ambulatory Visit: Payer: Medicare Other | Admitting: Physician Assistant

## 2021-07-31 ENCOUNTER — Encounter: Payer: Self-pay | Admitting: Physician Assistant

## 2021-07-31 VITALS — BP 151/77 | HR 67 | Resp 18 | Ht 62.0 in | Wt 134.0 lb

## 2021-07-31 DIAGNOSIS — G301 Alzheimer's disease with late onset: Secondary | ICD-10-CM | POA: Diagnosis not present

## 2021-07-31 DIAGNOSIS — F0280 Dementia in other diseases classified elsewhere without behavioral disturbance: Secondary | ICD-10-CM | POA: Diagnosis not present

## 2021-07-31 MED ORDER — RIVASTIGMINE TARTRATE 3 MG PO CAPS: 3.0000 mg | ORAL_CAPSULE | Freq: Two times a day (BID) | ORAL | 11 refills | Status: DC

## 2021-07-31 NOTE — Patient Instructions (Signed)
Good to see you! Continue Rivastigmine 3mg  twice a day.   Follow-up in 6 months, call for any changes   FALL PRECAUTIONS: Be cautious when walking. Scan the area for obstacles that may increase the risk of trips and falls. When getting up in the mornings, sit up at the edge of the bed for a few minutes before getting out of bed. Consider elevating the bed at the head end to avoid drop of blood pressure when getting up. Walk always in a well-lit room (use night lights in the walls). Avoid area rugs or power cords from appliances in the middle of the walkways. Use a walker or a cane if necessary and consider physical therapy for balance exercise. Get your eyesight checked regularly.   HOME SAFETY: Consider the safety of the kitchen when operating appliances like stoves, microwave oven, and blender. Consider having supervision and share cooking responsibilities until no longer able to participate in those. Accidents with firearms and other hazards in the house should be identified and addressed as well.  ABILITY TO BE LEFT ALONE: If patient is unable to contact 911 operator, consider using LifeLine, or when the need is there, arrange for someone to stay with patients. Smoking is a fire hazard, consider supervision or cessation. Risk of wandering should be assessed by caregiver and if detected at any point, supervision and safe proof recommendations should be instituted.  MEDICATION SUPERVISION: Inability to self-administer medication needs to be constantly addressed. Implement a mechanism to ensure safe administration of the medications.  RECOMMENDATIONS FOR ALL PATIENTS WITH MEMORY PROBLEMS: 1. Continue to exercise (Recommend 30 minutes of walking everyday, or 3 hours every week) 2. Increase social interactions - continue going to [place] and enjoy social gatherings with friends and family 3. Eat healthy, avoid fried foods and eat more fruits and vegetables 4. Maintain adequate blood pressure, blood  sugar, and blood cholesterol level. Reducing the risk of stroke and cardiovascular disease also helps promoting better memory. 5. Avoid stressful situations. Live a simple life and avoid aggravations. Organize your time and prepare for the next day in anticipation. 6. Sleep well, avoid any interruptions of sleep and avoid any distractions in the bedroom that may interfere with adequate sleep quality 7. Avoid sugar, avoid sweets as there is a strong link between excessive sugar intake, diabetes, and cognitive impairment The Mediterranean diet has been shown to help patients reduce the risk of progressive memory disorders and reduces cardiovascular risk. This includes eating fish, eat fruits and green leafy vegetables, nuts like almonds and hazelnuts, walnuts, and also use olive oil. Avoid fast foods and fried foods as much as possible. Avoid sweets and sugar as sugar use has been linked to worsening of memory function.  There is always a concern of gradual progression of memory problems. If this is the case, then we may need to adjust level of care according to patient needs. Support, both to the patient and caregiver, should then be put into place.

## 2021-07-31 NOTE — Progress Notes (Signed)
Assessment/Plan:    Late onset Alzheimer's disease without behavioral disturbance   Recommendations:  Discussed safety both in and out of the home.  Discussed the importance of regular daily schedule with inclusion of crossword puzzles to maintain brain function.  Continue to monitor mood by PCP Stay active at least 30 minutes at least 3 times a week.  Naps should be scheduled and should be no longer than 60 minutes and should not occur after 2 PM.  Mediterranean diet is recommended  Continue rivastigmine 3 mg twice daily Side effects were discussed Follow up in 6  months.   Case discussed with Dr. Karel Jarvis who agrees with the plan     Subjective:   ED visits since last seen: none  Hospital admissions: none  Lauren Manning is a 85 y.o. RH female  history of hypertension, hyperlipidemia, Alzheimer's disease  seen today in follow up for memory loss. This patient is accompanied in the office by her who supplements the history. She was last seen on 01/22/2021.repeat Neuropsychological testing in 08/2020 which showed fairly diffuse cognitive impairment with prominent dysfunction surrounding learning and memory, diagnosis of Major Neurocognitive Disorder, etiology likely Alzheimer's disease. There were performance declines in processing speed, semantic fluency, and visuoconstructional abilities compared to 2018 testing. She had difficulties with memory tasks and largely performed poorly across recognition trials, suggesting a memory storage deficit which is the hallmark characteristic of AD.  Previous records as well as any outside records available were reviewed prior to todays visit.  Patient is currently on Rivastigmine 3 mg twice daily, tolerating well. Since her last visit, the patient reports that she is "pretty much the same ".  She has not do any of activities outside of the house,  especially when getting at the [facility].her daughter is going to start bringing her to church where  she can do some activities.  Her medications are given in pill packs, and her daughter checks once a week if she misses any doses.  She does not cook as much, denies leaving the stove on.  Her daughter brings her most of the meals, although she is "very picky eater ".  Sometimes she forgets to eat, has to be reminded.  She admits to not wanting to drink enough water.  She does not drive, her daughter manages the finances.  She is independent with dressing and bathing.  She continues to "mess the remote control, or forgetting to charge the phone ".  She continues to plug and unplug the appliances, especially the TV, and then she has to call her daughter for help to fix it.  She gets fixated on things, and will asked the same question regarding that subject.  She sleeps well, no wandering behaviors, no hallucinations or paranoia.  Denies headaches, double vision, dizziness, focal numbness or tingling, unilateral weakness or tremors or anosmia. No history of seizures. Denies urine incontinence, retention, constipation or diarrhea.     History on Initial Assessment 11/23/2019: This is an 85 year old right-handed woman with a history of hypertension, hyperlipidemia, Alzheimer's disease, presenting for evaluation of dementia. Her daughter Lauren Manning is present to provide additional information. Family started noticing memory changes several years ago. She underwent Neuropsychological testing with Dr. 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 Cognitive profile is suggestive of medial-temporal lobe involvement. She had been on Donepezil 5mg  daily, dose was increased to 10mg  daily. It was noted  that she should continue to receive assistance with complex ADLs, and would be well suited for a continuing care retirement community or assisted living where she could have increased oversight of medications and meals. She  continues to live alone in a senior living apartment complex where her neighbors look out for her. She manages her own medications and denies missing any doses. Lauren Manning works close by and comes for lunch sometimes, and sees her pillbox would be empty, but states she did not take her medications this morning. One time, a neighbor called, they apparently called EMS because she went to her neighbor saying she thinks she took her medication twice, EMS checked her BP and she seemed fine. She stopped driving in 1696. Lauren Manning took over finances in 2014. She denies misplacing things, however Lauren Manning reports that she could not find her cane or keys this morning. She cannot tell Lauren Manning what she ate, and thinks she is eating the same things repeatedly. She has had some weight loss. She denies leaving the stove on. She is independent with dressing and bathing, she is "always cleaning," and house is well-kept. Lauren Manning has noticed that her mood has changed quite a bit, she gets irritated easily. She used to have birds and talked about strangling them because they were so noisy. They got rid of the birds this week. She cusses a lot more than before. No paranoia or hallucinations. She states her mood is okay. She has difficulties with sleep initiation and was prescribed nortriptyline, but they don't think she filled the prescription.    She denies any headaches, dizziness, diplopia, dysarthria, dysphagia, neck/back pain, focal numbness/tingling/weakness, bowel dysfunction. No anosmia, tremors. She has had diarrhea for "years" occurring 2-3 times a week. She had a bout this morning. Her daughter reports diverticulitis in the past, and that she still eats dairy because she forgets that she should avoid it. She reports a fall this morning, she lost her balance. No injuries. She usually ambulates with a cane. Her mother had Alzheimer's disease. No history of significant head injuries. No alcohol use.   I personally reviewed head  CT without contrast done 11/2017 which did not show any acute changes. There was moderate degree of periventricular and subcortical white matter hypoattenuation compatible with chronic microvascular ischemia. Mild atrophy with ventricular sulcal prominence  PREVIOUS MEDICATIONS:   CURRENT MEDICATIONS:  Outpatient Encounter Medications as of 07/31/2021  Medication Sig   amLODipine (NORVASC) 10 MG tablet Take 1 tablet (10 mg total) by mouth daily.   aspirin 81 MG tablet Take 81 mg by mouth daily.   conjugated estrogens (PREMARIN) vaginal cream Place 1 Applicatorful vaginally 2 (two) times a week. Insert 0.5 mg of cream vaginally twice weekly   diclofenac (VOLTAREN) 50 MG EC tablet TAKE 1 TABLET BY MOUTH TWICE A DAY AS NEEDED   hydrochlorothiazide (MICROZIDE) 12.5 MG capsule TAKE 1 CAPSULE BY MOUTH ONCE DAILY   lovastatin (MEVACOR) 40 MG tablet Take 1 tablet (40 mg total) by mouth at bedtime.   metoprolol succinate (TOPROL-XL) 25 MG 24 hr tablet TAKE 1 TABLET BY MOUTH ONCE A DAY   nortriptyline (PAMELOR) 25 MG capsule Take 1 capsule (25 mg total) by mouth at bedtime.   [DISCONTINUED] rivastigmine (EXELON) 3 MG capsule Take 1 capsule (3 mg total) by mouth 2 (two) times daily.   rivastigmine (EXELON) 3 MG capsule Take 1 capsule (3 mg total) by mouth 2 (two) times daily.   No facility-administered encounter medications on file as of 07/31/2021.  Objective:     PHYSICAL EXAMINATION:    VITALS:   Vitals:   07/31/21 1532  BP: (!) 151/77  Pulse: 67  Resp: 18  SpO2: 97%  Weight: 134 lb (60.8 kg)  Height: 5\' 2"  (1.575 m)    GEN:  The patient appears stated age and is in NAD. HEENT:  Normocephalic, atraumatic.   Neurological examination:  General: NAD, well-groomed, appears stated age. Orientation: The patient is alert. Oriented to person, place and date Cranial nerves: There is good facial symmetry.The speech is fluent and clear. No aphasia or dysarthria. Fund of knowledge is  appropriate. Recent and remote memory are impaired. Attention and concentration are reduced.  Able to name objects and repeat phrases.  Hearing is intact to conversational tone.    Sensation: Sensation is intact to light touch throughout Motor: Strength is at least antigravity x4. Tremors: none  DTR's 2/4 in UE/LE    No flowsheet data found. MMSE - Mini Mental State Exam 09/10/2017 09/09/2017  Orientation to time 2 2  Orientation to Place 3 3  Registration 2 2  Attention/ Calculation 5 5  Recall 1 1  Language- name 2 objects 2 2  Language- repeat 1 1  Language- follow 3 step command 3 3  Language- read & follow direction 1 1  Write a sentence 1 1  Copy design 1 1  Total score 22 22    [employer] Mental Exam 11/23/2019  Weekday Correct 0  Current year 0  What state are we in? 1  Amount spent 1  Amount left 0  # of Animals 2  5 objects recall 1  Number series 0  Hour markers 1  Time correct 2  Placed X in triangle correctly 1  Largest Figure 1  Name of female 2  Date back to work 0  Type of work 2  State she lived in 0  Total score 14       Movement examination: Tone: There is normal tone in the UE/LE Abnormal movements:  no tremor.  No myoclonus.  No asterixis.   Coordination:  There is no decremation with RAM's. Normal finger to nose  Gait and Station: The patient has no difficulty arising out of a deep-seated chair without the use of the hands. The patient's stride length is good.  Gait is cautious and narrow.        Total time spent on today's visit was 30  minutes, including both face-to-face time and nonface-to-face time. Time included that spent on review of records (prior notes available to me/labs/imaging if pertinent), discussing treatment and goals, answering patient's questions and coordinating care.  Cc:  03-05-1996, PA-C Margaretann Loveless, PA-C

## 2021-08-05 NOTE — Progress Notes (Signed)
GYNECOLOGY PROGRESS NOTE  Subjective:    Patient ID: Lauren Manning, female    DOB: August 11, 1936, 85 y.o.   MRN: [id number]  HPI  Patient is a 85 y.o. G66P2002 female who presents for a pessary check. Last pessary check was 04/02/2021. She reports no vaginal bleeding or discharge. She denies pelvic discomfort and difficulty urinating or moving her bowels.  Notes that she does not use recommended Premarin cream.   The following portions of the patient's history were reviewed and updated as appropriate:  She  has a past medical history of Arteriosclerotic cardiovascular disease (11/21/2015), Arthritis, Arthritis urica (11/21/2015), Baden-Walker grade 3 cystocele (05/19/2016), Cataract (11/21/2015), Cervical prolapse (11/21/2015), DD (diverticular disease) (11/21/2015), Essential (primary) hypertension (11/21/2015), GERD (gastroesophageal reflux disease), Hereditary hemochromatosis (06/09/2016), History of colonic polyps, HOH (hard of hearing), Hypercholesteremia (11/21/2015), Hyperglycemia, Hyperlipidemia, Insomnia (11/21/2015), Low back pain (11/21/2015), Major neurocognitive disorder due to Alzheimer's disease, without behavioral disturbance (11/21/2015), Multiple contusions of trunk, Neck pain (12/12/2014), and Osteopenia.  She  has a past surgical history that includes Tubal ligation; Cholecystectomy (04/16/2004); history of colon polyps (1999); Partial hysterectomy; Colonoscopy with propofol (N/A, 07/01/2016); Abdominal hysterectomy; Tonsillectomy; and Cataract extraction w/PHACO (Left, 07/22/2016).  Her family history includes Breast cancer (age of onset: 68) in her daughter; Dementia in her mother; Prostate cancer in her father.  She  reports that she has never smoked. She has never used smokeless tobacco. She reports that she does not drink alcohol and does not use drugs.  Current Outpatient Medications on File Prior to Visit  Medication Sig Dispense Refill   amLODipine (NORVASC) 10 MG tablet Take 1 tablet (10 mg  total) by mouth daily. 30 tablet 11   aspirin 81 MG tablet Take 81 mg by mouth daily.     conjugated estrogens (PREMARIN) vaginal cream Place 1 Applicatorful vaginally 2 (two) times a week. Insert 0.5 mg of cream vaginally twice weekly 42.5 g 12   diclofenac (VOLTAREN) 50 MG EC tablet TAKE 1 TABLET BY MOUTH TWICE A DAY AS NEEDED 180 tablet 1   hydrochlorothiazide (MICROZIDE) 12.5 MG capsule TAKE 1 CAPSULE BY MOUTH ONCE DAILY 30 capsule 2   lovastatin (MEVACOR) 40 MG tablet Take 1 tablet (40 mg total) by mouth at bedtime. 60 tablet 1   metoprolol succinate (TOPROL-XL) 25 MG 24 hr tablet TAKE 1 TABLET BY MOUTH ONCE A DAY 90 tablet 3   nortriptyline (PAMELOR) 25 MG capsule Take 1 capsule (25 mg total) by mouth at bedtime. 90 capsule 1   rivastigmine (EXELON) 3 MG capsule Take 1 capsule (3 mg total) by mouth 2 (two) times daily. 60 capsule 11   No current facility-administered medications on file prior to visit.   She is allergic to enalapril and penicillins..  Review of Systems A comprehensive review of systems was negative.   Objective:   Ht 5\' 2"  (1.575 m)   Wt 132 lb 12.8 oz (60.2 kg)   BMI 24.29 kg/m   General appearance: alert and no distress Abdomen: soft, non-tender; bowel sounds normal; no masses,  no organomegaly Pelvic: The patient's size 2 3/4 Gelhorn pessary was removed, cleaned and replaced without complications. Speculum examination revealed vaginal mucosa with moderate vaginal atrophy present. Small amount of bleeding with pessary removal. Small abrasion present on left vaginal wall.   Assessment:   Encounter for pessary maintenance Cystocele Grade 3 with cervical prolapse Vaginal atrophy  Plan:   - The patient should return in 3-4 months for a pessary check.    -  Strongly encouraged to utilize Premarin cream for atrophy and greater ease of pessary removal.    Hildred Laser, MD [facility]

## 2021-08-06 ENCOUNTER — Encounter: Payer: Self-pay | Admitting: Obstetrics and Gynecology

## 2021-08-06 ENCOUNTER — Other Ambulatory Visit: Payer: Self-pay

## 2021-08-06 ENCOUNTER — Ambulatory Visit: Payer: Medicare Other | Admitting: Obstetrics and Gynecology

## 2021-08-06 VITALS — BP 150/77 | HR 75 | Ht 62.0 in | Wt 132.8 lb

## 2021-08-06 DIAGNOSIS — Z4689 Encounter for fitting and adjustment of other specified devices: Secondary | ICD-10-CM | POA: Diagnosis not present

## 2021-08-06 DIAGNOSIS — N952 Postmenopausal atrophic vaginitis: Secondary | ICD-10-CM

## 2021-08-06 MED ORDER — ESTRADIOL 0.1 MG/GM VA CREA
1.0000 | TOPICAL_CREAM | VAGINAL | 4 refills | Status: DC
Start: 2021-08-08 — End: 2021-12-27

## 2021-09-09 ENCOUNTER — Telehealth: Payer: Self-pay | Admitting: Physician Assistant

## 2021-09-09 ENCOUNTER — Other Ambulatory Visit: Payer: Self-pay

## 2021-09-09 DIAGNOSIS — E7800 Pure hypercholesterolemia, unspecified: Secondary | ICD-10-CM

## 2021-09-09 NOTE — Telephone Encounter (Signed)
[facility] faxed refill request for the following medications:  lovastatin (MEVACOR) 40 MG tablet   Please advise.

## 2021-09-10 MED ORDER — LOVASTATIN 40 MG PO TABS: 40.0000 mg | ORAL_TABLET | Freq: Every day | ORAL | 0 refills | Status: DC

## 2021-10-15 ENCOUNTER — Telehealth: Payer: Self-pay | Admitting: Family Medicine

## 2021-10-15 ENCOUNTER — Other Ambulatory Visit: Payer: Self-pay | Admitting: Physician Assistant

## 2021-10-15 DIAGNOSIS — I10 Essential (primary) hypertension: Secondary | ICD-10-CM

## 2021-10-15 DIAGNOSIS — E7800 Pure hypercholesterolemia, unspecified: Secondary | ICD-10-CM

## 2021-10-15 NOTE — Telephone Encounter (Signed)
[facility] faxed refill request for the following medications:  nortriptyline (PAMELOR) 25 MG capsule   Please advise.

## 2021-10-16 ENCOUNTER — Other Ambulatory Visit: Payer: Self-pay

## 2021-10-16 DIAGNOSIS — G479 Sleep disorder, unspecified: Secondary | ICD-10-CM

## 2021-10-16 DIAGNOSIS — K580 Irritable bowel syndrome with diarrhea: Secondary | ICD-10-CM

## 2021-10-16 MED ORDER — NORTRIPTYLINE HCL 25 MG PO CAPS: 25.0000 mg | ORAL_CAPSULE | Freq: Every day | ORAL | 1 refills | Status: DC

## 2021-11-07 ENCOUNTER — Other Ambulatory Visit: Payer: Self-pay | Admitting: Physician Assistant

## 2021-11-07 DIAGNOSIS — E7800 Pure hypercholesterolemia, unspecified: Secondary | ICD-10-CM

## 2021-11-20 ENCOUNTER — Other Ambulatory Visit: Payer: Self-pay | Admitting: Family Medicine

## 2021-11-20 ENCOUNTER — Telehealth: Payer: Self-pay | Admitting: Physician Assistant

## 2021-11-20 DIAGNOSIS — I10 Essential (primary) hypertension: Secondary | ICD-10-CM

## 2021-11-20 DIAGNOSIS — M1990 Unspecified osteoarthritis, unspecified site: Secondary | ICD-10-CM

## 2021-11-20 MED ORDER — DICLOFENAC SODIUM 50 MG PO TBEC: 50.0000 mg | DELAYED_RELEASE_TABLET | Freq: Two times a day (BID) | ORAL | 0 refills | Status: DC | PRN

## 2021-11-20 NOTE — Telephone Encounter (Signed)
[facility] faxed refill request for the following medications: ? ?diclofenac (VOLTAREN) 50 MG EC tablet  ? ?Please advise. ? ?

## 2021-12-04 ENCOUNTER — Other Ambulatory Visit: Payer: Self-pay

## 2021-12-04 ENCOUNTER — Ambulatory Visit: Payer: Medicare Other | Admitting: Obstetrics and Gynecology

## 2021-12-04 ENCOUNTER — Encounter: Payer: Self-pay | Admitting: Obstetrics and Gynecology

## 2021-12-04 VITALS — BP 140/94 | HR 88 | Resp 16 | Ht 62.0 in | Wt 139.4 lb

## 2021-12-04 DIAGNOSIS — N8110 Cystocele, unspecified: Secondary | ICD-10-CM

## 2021-12-04 DIAGNOSIS — N952 Postmenopausal atrophic vaginitis: Secondary | ICD-10-CM

## 2021-12-04 DIAGNOSIS — Z4689 Encounter for fitting and adjustment of other specified devices: Secondary | ICD-10-CM | POA: Diagnosis not present

## 2021-12-04 NOTE — Progress Notes (Signed)
? ? ?  GYNECOLOGY PROGRESS NOTE ? ?Subjective:  ? ? Patient ID: Lauren Manning, female    DOB: 09-11-1936, 86 y.o.   MRN: [id number] ? ?HPI ? Patient is a 86 y.o. G57P2002 female who presents for a pessary check. Last pessary check was 04/02/2021. She reports no vaginal bleeding or discharge. She denies pelvic discomfort and difficulty urinating or moving her bowels.   ? ?The following portions of the patient's history were reviewed and updated as appropriate: allergies, current medications, past family history, past medical history, past social history, past surgical history, and problem list. ? ? ?Review of Systems ?A comprehensive review of systems was negative.  ? ?Objective:  ? BP (!) 140/94   Pulse 88   Resp 16   Ht 5\' 2"  (1.575 m)   Wt 139 lb 6.4 oz (63.2 kg)   BMI 25.50 kg/m?  ? ?General appearance: alert and no distress ?Abdomen: soft, non-tender; bowel sounds normal; no masses,  no organomegaly ?Pelvic: The patient's size 2 3/4 Gelhorn pessary was removed, cleaned and replaced without complications. Speculum examination revealed vaginal mucosa with moderate vaginal atrophy present. Small amount of bleeding with pessary removal.  ? ?Assessment:  ? ?Encounter for pessary maintenance ?Cystocele Grade 3 with cervical prolapse ?Vaginal atrophy ? ?Plan:  ? ?- The patient should return in 4-6 months for a pessary check.    ?- Strongly encouraged to utilize Premarin cream for atrophy and greater ease of pessary removal.  ?- Reviewed patient's medication list, notes she is only taking the statin and her baby aspirin.  ? ?  ? , MD ?[facility] ? ? ? ? ?

## 2021-12-12 ENCOUNTER — Other Ambulatory Visit: Payer: Self-pay | Admitting: Physician Assistant

## 2021-12-12 DIAGNOSIS — I10 Essential (primary) hypertension: Secondary | ICD-10-CM

## 2021-12-12 DIAGNOSIS — M1990 Unspecified osteoarthritis, unspecified site: Secondary | ICD-10-CM

## 2021-12-27 ENCOUNTER — Ambulatory Visit (INDEPENDENT_AMBULATORY_CARE_PROVIDER_SITE_OTHER): Payer: Medicare Other | Admitting: Family Medicine

## 2021-12-27 ENCOUNTER — Encounter: Payer: Self-pay | Admitting: Family Medicine

## 2021-12-27 VITALS — BP 148/78 | HR 80 | Temp 97.7°F | Resp 16 | Wt 136.5 lb

## 2021-12-27 DIAGNOSIS — I10 Essential (primary) hypertension: Secondary | ICD-10-CM | POA: Diagnosis not present

## 2021-12-27 DIAGNOSIS — E782 Mixed hyperlipidemia: Secondary | ICD-10-CM

## 2021-12-27 DIAGNOSIS — R739 Hyperglycemia, unspecified: Secondary | ICD-10-CM | POA: Insufficient documentation

## 2021-12-27 NOTE — Progress Notes (Signed)
?  ?[employer] as a Education administrator for [name], FNP.,have documented all relevant documentation on the behalf of [name], FNP,as directed by  [name], FNP while in the presence of [name], FNP.  ? ?Established patient visit ? ? ?Patient: Lauren Manning   DOB: 1935/12/26   86 y.o. Female  MRN: [id number] ?Visit Date: 12/27/2021 ? ?Today's healthcare provider: [name], FNP  ? ?Introduced to Designer, jewellery role and practice setting.  All questions answered.  Discussed provider/patient relationship and expectations. ? ? ?Chief Complaint  ?Patient presents with  ? Hypertension  ? Hyperlipidemia  ? ?Subjective  ?  ?HPI  ?Hypertension, follow-up ? ?BP Readings from Last 3 Encounters:  ?12/27/21 (!) 148/78  ?12/04/21 (!) 140/94  ?08/06/21 (!) 150/77  ? Wt Readings from Last 3 Encounters:  ?12/27/21 136 lb 8 oz (61.9 kg)  ?12/04/21 139 lb 6.4 oz (63.2 kg)  ?08/06/21 132 lb 12.8 oz (60.2 kg)  ?  ? ?She was last seen for hypertension 10 months ago.  ?BP at that visit was 155/76. Management since that visit includes none. ? ?She reports excellent compliance with treatment. ?She is not having side effects.  ?She is following a Regular diet. ?She is not exercising. ?She does not smoke. ? ?Use of agents associated with hypertension: none.  ? ?Outside blood pressures are not checked. ?Symptoms: ?No chest pain No chest pressure  ?No palpitations No syncope  ?No dyspnea No orthopnea  ?No paroxysmal nocturnal dyspnea No lower extremity edema  ? ?Pertinent labs ?Lab Results  ?Component Value Date  ? CHOL 159 04/02/2021  ? HDL 41 04/02/2021  ? [place] 91 04/02/2021  ? TRIG 152 (H) 04/02/2021  ? CHOLHDL 3.9 04/02/2021  ? Lab Results  ?Component Value Date  ? NA 140 04/02/2021  ? K 3.9 04/02/2021  ? CREATININE 1.24 (H) 04/02/2021  ? EGFR 43 (L) 04/02/2021  ? GLUCOSE 98 04/02/2021  ? TSH 2.980 11/23/2015  ?  ? ?The ASCVD Risk score (Arnett DK, et al., 2019) failed to calculate for the following  reasons: ?  The 2019 ASCVD risk score is only valid for ages 38 to 56 ? ?---------------------------------------------------------------------------------------------------  ?Lipid/Cholesterol, Follow-up ? ?Last lipid panel Other pertinent labs  ?Lab Results  ?Component Value Date  ? CHOL 159 04/02/2021  ? HDL 41 04/02/2021  ? [place] 91 04/02/2021  ? TRIG 152 (H) 04/02/2021  ? CHOLHDL 3.9 04/02/2021  ? Lab Results  ?Component Value Date  ? ALT 18 04/02/2021  ? AST 19 04/02/2021  ? PLT 279 04/02/2021  ? TSH 2.980 11/23/2015  ?  ? ?She was last seen for this 10 months ago.  ?Management since that visit includes none. ? ?She reports excellent compliance with treatment. ?She is not having side effects.  ? ?Symptoms: ?No chest pain No chest pressure/discomfort  ?No dyspnea No lower extremity edema  ?No numbness or tingling of extremity No orthopnea  ?No palpitations No paroxysmal nocturnal dyspnea  ?No speech difficulty No syncope  ? ?Current diet: well balanced ?Current exercise: no regular exercise ? ?The ASCVD Risk score (Arnett DK, et al., 2019) failed to calculate for the following reasons: ?  The 2019 ASCVD risk score is only valid for ages 87 to 71 ? ?---------------------------------------------------------------------------------------------------  ? ?Medications: ?Outpatient Medications Prior to Visit  ?Medication Sig  ? amLODipine (NORVASC) 10 MG tablet Take 1 tablet (10 mg total) by mouth daily.  ? aspirin 81 MG tablet  Take 81 mg by mouth daily.  ? hydrochlorothiazide (MICROZIDE) 12.5 MG capsule Take 1 capsule (12.5 mg total) by mouth daily. Take 1 Capsule by mouth daily  ? lovastatin (MEVACOR) 40 MG tablet TAKE 1 TABLET BY MOUTH EVERY NIGHT AT BEDTIME (NEED OFFICE VISIT FOR FUTURE REFILLS)  ? metoprolol succinate (TOPROL-XL) 25 MG 24 hr tablet TAKE 1 TABLET BY MOUTH ONCE A DAY  ? nortriptyline (PAMELOR) 25 MG capsule Take 1 capsule (25 mg total) by mouth at bedtime.  ? rivastigmine (EXELON) 3 MG capsule  Take 1 capsule (3 mg total) by mouth 2 (two) times daily.  ? [DISCONTINUED] diclofenac (VOLTAREN) 50 MG EC tablet Take 1 tablet (50 mg total) by mouth 2 (two) times daily as needed. (Patient not taking: Reported on 12/04/2021)  ? [DISCONTINUED] estradiol (ESTRACE VAGINAL) 0.1 MG/GM vaginal cream Place 1 Applicatorful vaginally 2 (two) times a week. (Patient not taking: Reported on 12/04/2021)  ? ?No facility-administered medications prior to visit.  ? ? ?Review of Systems ? ? ?  Objective  ?  ?BP (!) 148/78   Pulse 80   Temp 97.7 ?F (36.5 ?C) (Temporal)   Resp 16   Wt 136 lb 8 oz (61.9 kg)   SpO2 100%   BMI 24.97 kg/m?  ? ? ?Physical Exam ?Vitals and nursing note reviewed.  ?Constitutional:   ?   General: She is not in acute distress. ?   Appearance: Normal appearance. She is normal weight. She is not ill-appearing, toxic-appearing or diaphoretic.  ?HENT:  ?   Head: Normocephalic and atraumatic.  ?Cardiovascular:  ?   Rate and Rhythm: Normal rate and regular rhythm.  ?   Pulses: Normal pulses.  ?   Heart sounds: Normal heart sounds. No murmur heard. ?  No friction rub. No gallop.  ?Pulmonary:  ?   Effort: Pulmonary effort is normal. No respiratory distress.  ?   Breath sounds: Normal breath sounds. No stridor. No wheezing, rhonchi or rales.  ?Chest:  ?   Chest wall: No tenderness.  ?Abdominal:  ?   General: Bowel sounds are normal.  ?   Palpations: Abdomen is soft.  ?Musculoskeletal:     ?   General: No swelling, tenderness, deformity or signs of injury. Normal range of motion.  ?   Right lower leg: No edema.  ?   Left lower leg: No edema.  ?Skin: ?   General: Skin is warm and dry.  ?   Capillary Refill: Capillary refill takes less than 2 seconds.  ?   Coloration: Skin is not jaundiced or pale.  ?   Findings: No bruising, erythema, lesion or rash.  ?Neurological:  ?   General: No focal deficit present.  ?   Mental Status: She is alert and oriented to person, place, and time. Mental status is at baseline.  ?    Cranial Nerves: No cranial nerve deficit.  ?   Sensory: No sensory deficit.  ?   Motor: No weakness.  ?   Coordination: Coordination normal.  ?Psychiatric:     ?   Attention and Perception: Attention and perception normal.     ?   Mood and Affect: Mood and affect normal.     ?   Speech: Speech normal.     ?   Behavior: Behavior normal.     ?   Thought Content: Thought content normal.     ?   Cognition and Memory: Cognition is impaired. Memory is impaired.     ?  Judgment: Judgment normal.  ? ? ?No results found for any visits on 12/27/21. ? Assessment & Plan  ?  ? ?Problem List Items Addressed This Visit   ? ?  ? Cardiovascular and Mediastinum  ? Essential (primary) hypertension - Primary  ?  Chronic, elevated, recommend use of home monitoring and increase of HCTZ to 25 mg- due to use of bubble packing, will increase dose 01/20/22 ?Denies CP ?Denies SOB/ DOE ?Denies low blood pressure/hypotension ?Denies vision changes ?No LE Edema noted on exam ?Continue medication: Toprol XL 25 mg and Norvasc 10 mg ?Seek emergent care if you develop chest pain or chest pressure ? ?  ?  ? Relevant Orders  ? Comprehensive metabolic panel  ? CBC with Differential/Platelet  ?  ? Other  ? Elevated serum glucose  ?  Check A1c; increase in weight of 7# noted in past 6 months ?  ?  ? Relevant Orders  ? Hemoglobin A1c  ? Elevated triglycerides with high cholesterol  ?  Repeat NFLP; LDL has been stable on lovastatin 40 mg ? ?  ?  ? Relevant Orders  ? Lipid panel  ? ? ? ?Return in about 3 months (around 03/28/2022) for HTN management.  ?   ? ?I, [name], FNP, have reviewed all documentation for this visit. The documentation on 12/27/21 for the exam, diagnosis, procedures, and orders are all accurate and complete. ? ?[name], FNP  ?[place] ?[phone] (phone) ?[phone] (fax) ? ?[facility] ?

## 2021-12-27 NOTE — Assessment & Plan Note (Signed)
Chronic, elevated, recommend use of home monitoring and increase of HCTZ to 25 mg- due to use of bubble packing, will increase dose 01/20/22 ?Denies CP ?Denies SOB/ DOE ?Denies low blood pressure/hypotension ?Denies vision changes ?No LE Edema noted on exam ?Continue medication: Toprol XL 25 mg and Norvasc 10 mg ?Seek emergent care if you develop chest pain or chest pressure ? ?

## 2021-12-27 NOTE — Assessment & Plan Note (Signed)
Check A1c; increase in weight of 7# noted in past 6 months ?

## 2021-12-27 NOTE — Assessment & Plan Note (Signed)
Repeat NFLP; LDL has been stable on lovastatin 40 mg ? ?

## 2021-12-28 LAB — COMPREHENSIVE METABOLIC PANEL
ALT: 18 IU/L (ref 0–32)
AST: 21 IU/L (ref 0–40)
Albumin/Globulin Ratio: 1.9 (ref 1.2–2.2)
Albumin: 4.5 g/dL (ref 3.6–4.6)
Alkaline Phosphatase: 71 IU/L (ref 44–121)
BUN/Creatinine Ratio: 15 (ref 12–28)
BUN: 16 mg/dL (ref 8–27)
Bilirubin Total: 0.4 mg/dL (ref 0.0–1.2)
CO2: 25 mmol/L (ref 20–29)
Calcium: 10 mg/dL (ref 8.7–10.3)
Chloride: 103 mmol/L (ref 96–106)
Creatinine, Ser: 1.1 mg/dL — ABNORMAL HIGH (ref 0.57–1.00)
Globulin, Total: 2.4 g/dL (ref 1.5–4.5)
Glucose: 82 mg/dL (ref 70–99)
Potassium: 3.6 mmol/L (ref 3.5–5.2)
Sodium: 143 mmol/L (ref 134–144)
Total Protein: 6.9 g/dL (ref 6.0–8.5)
eGFR: 50 mL/min/{1.73_m2} — ABNORMAL LOW (ref >59)

## 2021-12-28 LAB — CBC WITH DIFFERENTIAL/PLATELET
Basophils Absolute: 0.1 10*3/uL (ref 0.0–0.2)
Basos: 1 %
EOS (ABSOLUTE): 0.1 10*3/uL (ref 0.0–0.4)
Eos: 1 %
Hematocrit: 46.4 % (ref 34.0–46.6)
Hemoglobin: 15.7 g/dL (ref 11.1–15.9)
Immature Grans (Abs): 0 10*3/uL (ref 0.0–0.1)
Immature Granulocytes: 0 %
Lymphocytes Absolute: 1.4 10*3/uL (ref 0.7–3.1)
Lymphs: 27 %
MCH: 30.9 pg (ref 26.6–33.0)
MCHC: 33.8 g/dL (ref 31.5–35.7)
MCV: 91 fL (ref 79–97)
Monocytes Absolute: 0.5 10*3/uL (ref 0.1–0.9)
Monocytes: 9 %
Neutrophils Absolute: 3.1 10*3/uL (ref 1.4–7.0)
Neutrophils: 62 %
Platelets: 270 10*3/uL (ref 150–450)
RBC: 5.08 x10E6/uL (ref 3.77–5.28)
RDW: 12.2 % (ref 11.7–15.4)
WBC: 5.1 10*3/uL (ref 3.4–10.8)

## 2021-12-28 LAB — LIPID PANEL
Chol/HDL Ratio: 3.7 ratio (ref 0.0–4.4)
Cholesterol, Total: 157 mg/dL (ref 100–199)
HDL: 42 mg/dL (ref >39)
LDL Chol Calc (NIH): 85 mg/dL (ref 0–99)
Triglycerides: 175 mg/dL — ABNORMAL HIGH (ref 0–149)
VLDL Cholesterol Cal: 30 mg/dL (ref 5–40)

## 2021-12-28 LAB — HEMOGLOBIN A1C
Est. average glucose Bld gHb Est-mCnc: 120 mg/dL
Hgb A1c MFr Bld: 5.8 % — ABNORMAL HIGH (ref 4.8–5.6)

## 2022-01-08 ENCOUNTER — Other Ambulatory Visit: Payer: Self-pay | Admitting: Family Medicine

## 2022-01-08 ENCOUNTER — Other Ambulatory Visit: Payer: Self-pay | Admitting: Physician Assistant

## 2022-01-08 DIAGNOSIS — M1990 Unspecified osteoarthritis, unspecified site: Secondary | ICD-10-CM

## 2022-01-08 DIAGNOSIS — I10 Essential (primary) hypertension: Secondary | ICD-10-CM

## 2022-01-08 MED ORDER — HYDROCHLOROTHIAZIDE 25 MG PO TABS: 25.0000 mg | ORAL_TABLET | Freq: Every day | ORAL | 11 refills | Status: AC

## 2022-01-10 ENCOUNTER — Telehealth: Payer: Self-pay

## 2022-01-10 MED ORDER — METFORMIN HCL ER 750 MG PO TB24: 750.0000 mg | ORAL_TABLET | Freq: Every day | ORAL | 11 refills | Status: DC

## 2022-01-10 MED ORDER — DAPAGLIFLOZIN PROPANEDIOL 10 MG PO TABS: 10.0000 mg | ORAL_TABLET | Freq: Every day | ORAL | 11 refills | Status: DC

## 2022-01-10 NOTE — Telephone Encounter (Signed)
Blood sugar shows pre-diabetes. Recommend Metformin XR 750 and low dose lisinopril at 10 mg to assist. Will order for May start if Ok'd.  ? Elliot Cousin, RN  ?01/09/2022 11:15 AM EDT Back to Top  ?  ?Result note read to pt's daughter, verbalizes understanding. Agreeable to meds, please ensure RX sent to [facility] for pill pack process.  ? ? Lisinopril changed to farxiga 10mg  daily due to allergy/contraindications. Per Tally Joe. ?

## 2022-01-10 NOTE — Telephone Encounter (Signed)
-----   Message from Fonda Kinder, CMA sent at 12/31/2021  4:21 PM EDT ----- ?Lmtcb okay for [facility] nurse triage to advise. KW ?

## 2022-01-23 ENCOUNTER — Ambulatory Visit (INDEPENDENT_AMBULATORY_CARE_PROVIDER_SITE_OTHER): Payer: Medicare Other

## 2022-01-23 VITALS — Ht 62.0 in | Wt 136.0 lb

## 2022-01-23 DIAGNOSIS — Z0000 Encounter for general adult medical examination without abnormal findings: Secondary | ICD-10-CM | POA: Diagnosis not present

## 2022-01-23 NOTE — Patient Instructions (Signed)
[name] , ?Thank you for taking time to come for your Medicare Wellness Visit. I appreciate your ongoing commitment to your health goals. Please review the following plan we discussed and let me know if I can assist you in the future.  ? ?Screening recommendations/referrals: ?Colonoscopy: aged out ?Mammogram: aged out ?Bone Density: declined ?Recommended yearly ophthalmology/optometry visit for glaucoma screening and checkup ?Recommended yearly dental visit for hygiene and checkup ? ?Vaccinations: ?Influenza vaccine: n/d ?Pneumococcal vaccine: n/d ?Tdap vaccine: 12/25/05, due ?Shingles vaccine: n/d   ?Covid-19:n/d ? ?Advanced directives: no ? ?Conditions/risks identified: none ? ?Next appointment: Follow up in one year for your annual wellness visit - declined ? ? ?Preventive Care 42 Years and Older, Female ?Preventive care refers to lifestyle choices and visits with your health care provider that can promote health and wellness. ?What does preventive care include? ?A yearly physical exam. This is also called an annual well check. ?Dental exams once or twice a year. ?Routine eye exams. Ask your health care provider how often you should have your eyes checked. ?Personal lifestyle choices, including: ?Daily care of your teeth and gums. ?Regular physical activity. ?Eating a healthy diet. ?Avoiding tobacco and drug use. ?Limiting alcohol use. ?Practicing safe sex. ?Taking low-dose aspirin every day. ?Taking vitamin and mineral supplements as recommended by your health care provider. ?What happens during an annual well check? ?The services and screenings done by your health care provider during your annual well check will depend on your age, overall health, lifestyle risk factors, and family history of disease. ?Counseling  ?Your health care provider may ask you questions about your: ?Alcohol use. ?Tobacco use. ?Drug use. ?Emotional well-being. ?Home and relationship well-being. ?Sexual activity. ?Eating habits. ?History  of falls. ?Memory and ability to understand (cognition). ?Work and work Statistician. ?Reproductive health. ?Screening  ?You may have the following tests or measurements: ?Height, weight, and BMI. ?Blood pressure. ?Lipid and cholesterol levels. These may be checked every 5 years, or more frequently if you are over 34 years old. ?Skin check. ?Lung cancer screening. You may have this screening every year starting at age 77 if you have a 30-pack-year history of smoking and currently smoke or have quit within the past 15 years. ?Fecal occult blood test (FOBT) of the stool. You may have this test every year starting at age 55. ?Flexible sigmoidoscopy or colonoscopy. You may have a sigmoidoscopy every 5 years or a colonoscopy every 10 years starting at age 52. ?Hepatitis C blood test. ?Hepatitis B blood test. ?Sexually transmitted disease (STD) testing. ?Diabetes screening. This is done by checking your blood sugar (glucose) after you have not eaten for a while (fasting). You may have this done every 1-3 years. ?Bone density scan. This is done to screen for osteoporosis. You may have this done starting at age 33. ?Mammogram. This may be done every 1-2 years. Talk to your health care provider about how often you should have regular mammograms. ?Talk with your health care provider about your test results, treatment options, and if necessary, the need for more tests. ?Vaccines  ?Your health care provider may recommend certain vaccines, such as: ?Influenza vaccine. This is recommended every year. ?Tetanus, diphtheria, and acellular pertussis (Tdap, Td) vaccine. You may need a Td booster every 10 years. ?Zoster vaccine. You may need this after age 61. ?Pneumococcal 13-valent conjugate (PCV13) vaccine. One dose is recommended after age 84. ?Pneumococcal polysaccharide (PPSV23) vaccine. One dose is recommended after age 17. ?Talk to your health care provider about which  screenings and vaccines you need and how often you need  them. ?This information is not intended to replace advice given to you by your health care provider. Make sure you discuss any questions you have with your health care provider. ?Document Released: 10/05/2015 Document Revised: 05/28/2016 Document Reviewed: 07/10/2015 ?Elsevier Interactive Patient Education ? 2017 [place]. ? ?Fall Prevention in the Home ?Falls can cause injuries. They can happen to people of all ages. There are many things you can do to make your home safe and to help prevent falls. ?What can I do on the outside of my home? ?Regularly fix the edges of walkways and driveways and fix any cracks. ?Remove anything that might make you trip as you walk through a door, such as a raised step or threshold. ?Trim any bushes or trees on the path to your home. ?Use bright outdoor lighting. ?Clear any walking paths of anything that might make someone trip, such as rocks or tools. ?Regularly check to see if handrails are loose or broken. Make sure that both sides of any steps have handrails. ?Any raised decks and porches should have guardrails on the edges. ?Have any leaves, snow, or ice cleared regularly. ?Use sand or salt on walking paths during winter. ?Clean up any spills in your garage right away. This includes oil or grease spills. ?What can I do in the bathroom? ?Use night lights. ?Install grab bars by the toilet and in the tub and shower. Do not use towel bars as grab bars. ?Use non-skid mats or decals in the tub or shower. ?If you need to sit down in the shower, use a plastic, non-slip stool. ?Keep the floor dry. Clean up any water that spills on the floor as soon as it happens. ?Remove soap buildup in the tub or shower regularly. ?Attach bath mats securely with double-sided non-slip rug tape. ?Do not have throw rugs and other things on the floor that can make you trip. ?What can I do in the bedroom? ?Use night lights. ?Make sure that you have a light by your bed that is easy to reach. ?Do not use  any sheets or blankets that are too big for your bed. They should not hang down onto the floor. ?Have a firm chair that has side arms. You can use this for support while you get dressed. ?Do not have throw rugs and other things on the floor that can make you trip. ?What can I do in the kitchen? ?Clean up any spills right away. ?Avoid walking on wet floors. ?Keep items that you use a lot in easy-to-reach places. ?If you need to reach something above you, use a strong step stool that has a grab bar. ?Keep electrical cords out of the way. ?Do not use floor polish or wax that makes floors slippery. If you must use wax, use non-skid floor wax. ?Do not have throw rugs and other things on the floor that can make you trip. ?What can I do with my stairs? ?Do not leave any items on the stairs. ?Make sure that there are handrails on both sides of the stairs and use them. Fix handrails that are broken or loose. Make sure that handrails are as long as the stairways. ?Check any carpeting to make sure that it is firmly attached to the stairs. Fix any carpet that is loose or worn. ?Avoid having throw rugs at the top or bottom of the stairs. If you do have throw rugs, attach them to the floor with carpet  tape. ?Make sure that you have a light switch at the top of the stairs and the bottom of the stairs. If you do not have them, ask someone to add them for you. ?What else can I do to help prevent falls? ?Wear shoes that: ?Do not have high heels. ?Have rubber bottoms. ?Are comfortable and fit you well. ?Are closed at the toe. Do not wear sandals. ?If you use a stepladder: ?Make sure that it is fully opened. Do not climb a closed stepladder. ?Make sure that both sides of the stepladder are locked into place. ?Ask someone to hold it for you, if possible. ?Clearly mark and make sure that you can see: ?Any grab bars or handrails. ?First and last steps. ?Where the edge of each step is. ?Use tools that help you move around (mobility aids)  if they are needed. These include: ?Canes. ?Walkers. ?Scooters. ?Crutches. ?Turn on the lights when you go into a dark area. Replace any light bulbs as soon as they burn out. ?Set up your furniture so you h

## 2022-01-23 NOTE — Progress Notes (Signed)
?Virtual Visit via Telephone Note ? ?I connected with  Lauren NySandra L Messina on 01/23/22 at  1:45 PM EDT by telephone and verified that I am speaking with the correct person using two identifiers. ? ?Location: ?Patient: home ?Provider: [facility] ?Persons participating in the virtual visit: patient/Nurse Health Advisor ?  ?I discussed the limitations, risks, security and privacy concerns of performing an evaluation and management service by telephone and the availability of in person appointments. The patient expressed understanding and agreed to proceed. ? ?Interactive audio and video telecommunications were attempted between this nurse and patient, however failed, due to patient having technical difficulties OR patient did not have access to video capability.  We continued and completed visit with audio only. ? ?Some vital signs may be absent or patient reported.  ? ?Hal HopeLorrie S , LPN ? ?Subjective:  ? Lauren Manning is a 86 y.o. female who presents for Medicare Annual (Subsequent) preventive examination. ? ?Review of Systems    ? ?  ? ?   ?Objective:  ?  ?There were no vitals filed for this visit. ?There is no height or weight on file to calculate BMI. ? ? ?  07/31/2021  ?  3:33 PM 01/22/2021  ?  3:52 PM 06/05/2020  ?  4:04 PM 11/23/2019  ?  9:13 AM 12/04/2017  ?  1:42 PM 11/23/2017  ?  7:13 PM 03/19/2017  ?  2:20 PM  ?Advanced Directives  ?Does Patient Have a Medical Advance Directive? No No No Yes No Yes No  ?Type of Ambulance personAdvance Directive    Healthcare Power of Attorney  Healthcare Power of Attorney   ?Does patient want to make changes to medical advance directive?     No - Patient declined No - Patient declined   ?Copy of Healthcare Power of Attorney in Chart?      No - copy requested   ?Would patient like information on creating a medical advance directive?     No - Patient declined  Yes (ED - Information included in AVS)  ? ? ?Current Medications (verified) ?Outpatient Encounter Medications as of 01/23/2022  ?Medication Sig  ?  amLODipine (NORVASC) 10 MG tablet Take 1 tablet (10 mg total) by mouth daily.  ? aspirin 81 MG tablet Take 81 mg by mouth daily.  ? dapagliflozin propanediol (FARXIGA) 10 MG TABS tablet Take 1 tablet (10 mg total) by mouth daily before breakfast.  ? diclofenac (VOLTAREN) 50 MG EC tablet Take 1 tablet (50 mg total) by mouth 2 (two) times daily.  ? hydrochlorothiazide (HYDRODIURIL) 25 MG tablet Take 1 tablet (25 mg total) by mouth daily.  ? lovastatin (MEVACOR) 40 MG tablet TAKE 1 TABLET BY MOUTH EVERY NIGHT AT BEDTIME (NEED OFFICE VISIT FOR FUTURE REFILLS)  ? metFORMIN (GLUCOPHAGE-XR) 750 MG 24 hr tablet Take 1 tablet (750 mg total) by mouth daily with breakfast.  ? metoprolol succinate (TOPROL-XL) 25 MG 24 hr tablet TAKE 1 TABLET BY MOUTH ONCE A DAY  ? nortriptyline (PAMELOR) 25 MG capsule Take 1 capsule (25 mg total) by mouth at bedtime.  ? rivastigmine (EXELON) 3 MG capsule Take 1 capsule (3 mg total) by mouth 2 (two) times daily.  ? ?No facility-administered encounter medications on file as of 01/23/2022.  ? ? ?Allergies (verified) ?Enalapril and Penicillins  ? ?History: ?Past Medical History:  ?Diagnosis Date  ? Arteriosclerotic cardiovascular disease 11/21/2015  ? Arthritis   ? Gout  ? Arthritis urica 11/21/2015  ? Baden-Walker grade 3 cystocele 05/19/2016  ?  Cataract 11/21/2015  ? Cervical prolapse 11/21/2015  ? DD (diverticular disease) 11/21/2015  ? Essential (primary) hypertension 11/21/2015  ? GERD (gastroesophageal reflux disease)   ? Hereditary hemochromatosis 06/09/2016  ? History of colonic polyps   ? HOH (hard of hearing)   ? Hypercholesteremia 11/21/2015  ? Hyperglycemia   ? Hyperlipidemia   ? Insomnia 11/21/2015  ? Low back pain 11/21/2015  ? Major neurocognitive disorder due to Alzheimer's disease, without behavioral disturbance 11/21/2015  ? Multiple contusions of trunk   ? Neck pain 12/12/2014  ? Osteopenia   ? ?Past Surgical History:  ?Procedure Laterality Date  ? ABDOMINAL HYSTERECTOMY    ? CATARACT EXTRACTION W/PHACO  Left 07/22/2016  ? Procedure: CATARACT EXTRACTION PHACO AND INTRAOCULAR LENS PLACEMENT (IOC);  Surgeon: Galen Manila, MD;  Location: [facility] ORS;  Service: Ophthalmology;  Laterality: Left;  [place] 43.1 ?AP% 20.2 ?CDE 8.71 ?Fluid pack lot # [id number]  ? CHOLECYSTECTOMY  04/16/2004  ? Dr. Orlene Erm. Anterior and posterior colporrhaphy  ? COLONOSCOPY WITH PROPOFOL N/A 07/01/2016  ? Procedure: COLONOSCOPY WITH PROPOFOL;  Surgeon: Midge Minium, MD;  Location: [facility] ENDOSCOPY;  Service: Endoscopy;  Laterality: N/A;  ? history of colon polyps  1999  ? PARTIAL HYSTERECTOMY    ? TONSILLECTOMY    ? TUBAL LIGATION    ? ?Family History  ?Problem Relation Age of Onset  ? Dementia Mother   ? Prostate cancer Father   ? Breast cancer Daughter 68  ?     breast cancer  ? ?Social History  ? ?Socioeconomic History  ? Marital status: Single  ?  Spouse name: Not on file  ? Number of children: Not on file  ? Years of education: 64  ? Highest education level: High school graduate  ?Occupational History  ? Occupation: Retired  ?Tobacco Use  ? Smoking status: Never  ? Smokeless tobacco: Never  ?Vaping Use  ? Vaping Use: Never used  ?Substance and Sexual Activity  ? Alcohol use: No  ? Drug use: No  ? Sexual activity: Not Currently  ?  Birth control/protection: None  ?Other Topics Concern  ? Not on file  ?Social History Narrative  ? Right handed   ? Lives alone  ? One story home no steps   ? ?Social Determinants of Health  ? ?Financial Resource Strain: Not on file  ?Food Insecurity: Not on file  ?Transportation Needs: Not on file  ?Physical Activity: Not on file  ?Stress: Not on file  ?Social Connections: Not on file  ? ? ?Tobacco Counseling ?Counseling given: Not Answered ? ? ?Clinical Intake: ? ?Pre-visit preparation completed: Yes ? ?Pain : No/denies pain ? ?  ? ?Nutritional Risks: None ?Diabetes: No ? ?How often do you need to have someone help you when you read instructions, pamphlets, or other written materials from your doctor or pharmacy?: 1  - Never ? ?Diabetic?yes ?Nutrition Risk Assessment: ? ?Has the patient had any N/V/D within the last 2 months?  No  ?Does the patient have any non-healing wounds?  No  ?Has the patient had any unintentional weight loss or weight gain?  No  ? ?Diabetes: ? ?Is the patient diabetic?  Yes  ?If diabetic, was a CBG obtained today?  No  ?Did the patient bring in their glucometer from home?  No  ?How often do you monitor your CBG's? never.  ? ?Financial Strains and Diabetes Management: ? ?Are you having any financial strains with the device, your supplies or your medication? No .  ?  Does the patient want to be seen by Chronic Care Management for management of their diabetes?  No  ?Would the patient like to be referred to a Nutritionist or for Diabetic Management?  No  ? ?Diabetic Exams: ? ?Diabetic Eye Exam: Completed no. Overdue for diabetic eye exam. Pt has been advised about the importance in completing this exam.  ? ?Diabetic Foot Exam: Completed no. Pt has been advised about the importance in completing this exam.  ? ? ?Interpreter Needed?: No ? ?Information entered by :: Kennedy Bucker, LPN ? ? ?Activities of Daily Living ? ?  12/27/2021  ?  1:18 PM 03/11/2021  ?  3:53 PM  ?In your present state of health, do you have any difficulty performing the following activities:  ?Hearing? 1 1  ?Vision? 0 0  ?Difficulty concentrating or making decisions? 1 1  ?Walking or climbing stairs? 1 0  ?Dressing or bathing? 0 0  ?Doing errands, shopping? 0 0  ? ? ?Patient Care Team: ?Jacky Kindle, FNP as PCP - General (Family Medicine) ?Hildred Laser, MD as Referring Physician (Obstetrics and Gynecology) ?Koleen Distance, PsyD as Consulting Physician (Psychology) ?Van Clines, MD as Consulting Physician (Neurology) ? ?Indicate any recent Medical Services you may have received from other than [facility] providers in the past year (date may be approximate). ? ?   ?Assessment:  ? This is a routine wellness examination for  Ytzel. ? ?Hearing/Vision screen ?No results found. ? ?Dietary issues and exercise activities discussed: ?  ? ? Goals Addressed   ?None ?  ? ?Depression Screen ? ?  12/27/2021  ?  1:18 PM 03/11/2021  ?  3:53 PM 09/07/2019  ?

## 2022-01-28 ENCOUNTER — Other Ambulatory Visit: Payer: Self-pay | Admitting: Family Medicine

## 2022-01-31 ENCOUNTER — Ambulatory Visit: Payer: Medicare Other | Admitting: Physician Assistant

## 2022-02-25 ENCOUNTER — Other Ambulatory Visit: Payer: Self-pay | Admitting: Physician Assistant

## 2022-02-25 DIAGNOSIS — E7800 Pure hypercholesterolemia, unspecified: Secondary | ICD-10-CM

## 2022-02-26 NOTE — Telephone Encounter (Signed)
Requested Prescriptions  Pending Prescriptions Disp Refills  . lovastatin (MEVACOR) 40 MG tablet [Pharmacy Med Name: LOVASTATIN 40 MG TAB] 100 tablet     Sig: TAKE 1 TABLET BY MOUTH EVERY NIGHT AT BEDTIME (NEED OFFICE VISIT FOR FUTURE REFILLS)     Cardiovascular:  Antilipid - Statins 2 Failed - 02/25/2022  3:41 PM      Failed - Cr in normal range and within 360 days    Creatinine  Date Value Ref Range Status  08/18/2013 0.97 0.60 - 1.30 mg/dL Final   Creatinine, Ser  Date Value Ref Range Status  12/27/2021 1.10 (H) 0.57 - 1.00 mg/dL Final         Failed - Lipid Panel in normal range within the last 12 months    Cholesterol, Total  Date Value Ref Range Status  12/27/2021 157 100 - 199 mg/dL Final   LDL Chol Calc (NIH)  Date Value Ref Range Status  12/27/2021 85 0 - 99 mg/dL Final   HDL  Date Value Ref Range Status  12/27/2021 42 >39 mg/dL Final   Triglycerides  Date Value Ref Range Status  12/27/2021 175 (H) 0 - 149 mg/dL Final         Passed - Patient is not pregnant      Passed - Valid encounter within last 12 months    Recent Outpatient Visits          2 months ago Essential (primary) hypertension   [facility] Merita Norton T, FNP   11 months ago Late onset Alzheimer's disease without behavioral disturbance [facility])   [employer], Jodell Cipro, PA-C   1 year ago Right leg pain   [facility] [place], [place], [place]   3 years ago Acute left-sided low back pain with left-sided sciatica   [facility] [place], [place] Dike M, [place]   4 years ago Late onset Alzheimer's disease without behavioral disturbance   [facility] [place], [place], [place]

## 2022-03-10 ENCOUNTER — Ambulatory Visit: Payer: Medicare Other | Admitting: Physician Assistant

## 2022-03-10 ENCOUNTER — Encounter: Payer: Self-pay | Admitting: Physician Assistant

## 2022-03-10 VITALS — BP 126/58 | HR 74 | Resp 18 | Ht 62.0 in | Wt 128.0 lb

## 2022-03-10 DIAGNOSIS — F0280 Dementia in other diseases classified elsewhere without behavioral disturbance: Secondary | ICD-10-CM

## 2022-03-10 DIAGNOSIS — G309 Alzheimer's disease, unspecified: Secondary | ICD-10-CM

## 2022-03-10 NOTE — Patient Instructions (Signed)
It was a pleasure to see you today at our office.   Recommendations:  Follow up in 6  months Continue exelon 3 mg twice a day  Continue increasing you activitiy level   Whom to call:  Memory  decline, memory medications: Call our office [phone]   For psychiatric meds, mood meds: Please have your primary care physician manage these medications.   Counseling regarding caregiver distress, including caregiver depression, anxiety and issues regarding community resources, adult day care programs, adult living facilities, or memory care questions:   Feel free to contact Misty Lisabeth Register, Social Worker at [phone]   For assessment of decision of mental capacity and competency:  Call Dr. Erick Blinks, geriatric psychiatrist at [phone]  For guidance in geriatric dementia issues please call [facility] [phone]  For guidance regarding [facility] and if placement were needed at the facility, contact Sidney Ace, Social Worker tel: [phone]  If you have any severe symptoms of a stroke, or other severe issues such as confusion,severe chills or fever, etc call 911 or go to the ER as you may need to be evaluated further   Feel free to visit Facebook page " [employer]" for tips of how to care for people with memory problems.      RECOMMENDATIONS FOR ALL PATIENTS WITH MEMORY PROBLEMS: 1. Continue to exercise (Recommend 30 minutes of walking everyday, or 3 hours every week) 2. Increase social interactions - continue going to [place] and enjoy social gatherings with friends and family 3. Eat healthy, avoid fried foods and eat more fruits and vegetables 4. Maintain adequate blood pressure, blood sugar, and blood cholesterol level. Reducing the risk of stroke and cardiovascular disease also helps promoting better memory. 5. Avoid stressful situations. Live a simple life and avoid aggravations. Organize your time and prepare for the next day in  anticipation. 6. Sleep well, avoid any interruptions of sleep and avoid any distractions in the bedroom that may interfere with adequate sleep quality 7. Avoid sugar, avoid sweets as there is a strong link between excessive sugar intake, diabetes, and cognitive impairment We discussed the Mediterranean diet, which has been shown to help patients reduce the risk of progressive memory disorders and reduces cardiovascular risk. This includes eating fish, eat fruits and green leafy vegetables, nuts like almonds and hazelnuts, walnuts, and also use olive oil. Avoid fast foods and fried foods as much as possible. Avoid sweets and sugar as sugar use has been linked to worsening of memory function.  There is always a concern of gradual progression of memory problems. If this is the case, then we may need to adjust level of care according to patient needs. Support, both to the patient and caregiver, should then be put into place.    FALL PRECAUTIONS: Be cautious when walking. Scan the area for obstacles that may increase the risk of trips and falls. When getting up in the mornings, sit up at the edge of the bed for a few minutes before getting out of bed. Consider elevating the bed at the head end to avoid drop of blood pressure when getting up. Walk always in a well-lit room (use night lights in the walls). Avoid area rugs or power cords from appliances in the middle of the walkways. Use a walker or a cane if necessary and consider physical therapy for balance exercise. Get your eyesight checked regularly.  FINANCIAL OVERSIGHT: Supervision, especially oversight when making financial decisions or transactions is also recommended.  HOME  SAFETY: Consider the safety of the kitchen when operating appliances like stoves, microwave oven, and blender. Consider having supervision and share cooking responsibilities until no longer able to participate in those. Accidents with firearms and other hazards in the house should  be identified and addressed as well.   ABILITY TO BE LEFT ALONE: If patient is unable to contact 911 operator, consider using LifeLine, or when the need is there, arrange for someone to stay with patients. Smoking is a fire hazard, consider supervision or cessation. Risk of wandering should be assessed by caregiver and if detected at any point, supervision and safe proof recommendations should be instituted.  MEDICATION SUPERVISION: Inability to self-administer medication needs to be constantly addressed. Implement a mechanism to ensure safe administration of the medications.   DRIVING: Regarding driving, in patients with progressive memory problems, driving will be impaired. We advise to have someone else do the driving if trouble finding directions or if minor accidents are reported. Independent driving assessment is available to determine safety of driving.   If you are interested in the driving assessment, you can contact the following:  [employer] in [place] [phone]  [facility] [phone]  [facility] [phone]  [facility] [phone] or [phone]

## 2022-03-10 NOTE — Progress Notes (Unsigned)
Assessment/Plan:   Dementia likely due to    Recommendations:    Continue  10 mg   Side effects were discussed Follow up in   months.   Case discussed with Dr. Karel Jarvis who agrees with the plan     Subjective:    Lauren Manning is a very pleasant 86 y.o. RH female  seen today in follow up for memory loss. This patient is accompanied in the office by her who supplements the history.  Previous records as well as any outside records available were reviewed prior to todays visit.  Patient was last seen at our office on  at which time her  Patient is currently on   Any changes in memory since last visit? Patient lives with: Spouse who noticed changes as well.  Patient lives alone repeats oneself?  Disoriented when walking into a room?  Patient denies   Leaving objects in unusual places?  Patient denies   Ambulates  with difficulty?   Patient denies   Recent falls?  Patient denies   Any head injuries?  Patient denies   History of seizures?   Patient denies   Wandering behavior?  Patient denies   Patient drives?   Patient no longer drives  Patient drives with assistance  Patient uses [employer] to drive   Any mood changes such irritability agitation?  Patient denies   Any history of depression?:  Patient denies   Hallucinations?  Patient denies   Paranoia?  Patient denies   Patient reports that he sleeps well without vivid dreams, REM behavior or sleepwalking   Patient reports vivid dreams   History of sleep apnea?  Patient denies   Any hygiene concerns?  Patient denies   Independent of bathing and dressing?  Endorsed  Does the patient needs help with medications?  pillbox pill pack  Patient denies   Who is in charge of the finances?  Patient is in charge   is in charge   assists the patient  and denies missing any bills   occasionally misses a payment. Any changes in appetite?  Patient denies   Patient have trouble swallowing? Patient denies   Does the patient cook?  Patient denies    Any kitchen accidents such as leaving the stove on? Patient denies   Any headaches?  Patient denies   The double vision? Patient denies   Any focal numbness or tingling?  Patient denies   Chronic back pain Patient denies   Unilateral weakness?  Patient denies   Any tremors?  Patient denies   Any history of anosmia?  Patient denies   Any incontinence of urine?  Patient denies   Any bowel dysfunction?   Patient denies     Constipation     diarrhea Lauren Manning is a 86 y.o. RH female  history of hypertension, hyperlipidemia, Alzheimer's disease  seen today in follow up for memory loss. This patient is accompanied in the office by her who supplements the history. She was last seen on 01/22/2021.repeat Neuropsychological testing in 08/2020 which showed fairly diffuse cognitive impairment with prominent dysfunction surrounding learning and memory, diagnosis of Major Neurocognitive Disorder, etiology likely Alzheimer's disease. There were performance declines in processing speed, semantic fluency, and visuoconstructional abilities compared to 2018 testing. She had difficulties with memory tasks and largely performed poorly across recognition trials, suggesting a memory storage deficit which is the hallmark characteristic of AD.  Previous records as well as any outside records available were reviewed prior to  todays visit.  Patient is currently on Rivastigmine 3 mg twice daily, tolerating well. Since her last visit, the patient reports that she is "pretty much the same ".   She has not do any of activities outside of the house,  especially when getting at the [facility]. her daughter is going to start bringing her to church where she can do some activities.  Her medications are given in pill packs, and her daughter checks once a week if she misses any doses.  She does not cook as much, denies leaving the stove on.  Her daughter brings her most of the meals, although she is "very picky eater ".  Sometimes  she forgets to eat, has to be reminded.  She admits to not wanting to drink enough water.  She does not drive, her daughter manages the finances.  She is independent with dressing and bathing.  She continues to "mess the remote control, or forgetting to charge the phone ".  She continues to plug and unplug the appliances, especially the TV, and then she has to call her daughter for help to fix it.  She gets fixated on things, and will asked the same question regarding that subject.  She sleeps well, no wandering behaviors, no hallucinations or paranoia.  Denies headaches, double vision, dizziness, focal numbness or tingling, unilateral weakness or tremors or anosmia. No history of seizures. Denies urine incontinence, retention, constipation or diarrhea.      History on Initial Assessment 11/23/2019: This is an 86 year old right-handed woman with a history of hypertension, hyperlipidemia, Alzheimer's disease, presenting for evaluation of dementia. Her daughter [name] is present to provide additional information. Family started noticing memory changes several years ago. She underwent Neuropsychological testing with Dr. 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 Cognitive profile is suggestive of medial-temporal lobe involvement. She had been on Donepezil 5mg  daily, dose was increased to 10mg  daily. It was noted that she should continue to receive assistance with complex ADLs, and would be well suited for a continuing care retirement community or assisted living where she could have increased oversight of medications and meals. She continues to live alone in a senior living apartment complex where her neighbors look out for her. She manages her own medications and denies missing any doses. works close by and comes for lunch sometimes, and sees her pillbox would be empty, but states she did not take her  medications this morning. One time, a neighbor called, they apparently called EMS because she went to her neighbor saying she thinks she took her medication twice, EMS checked her BP and she seemed fine. She stopped driving in . [name] took over finances in 2014. She denies misplacing things, however [name] reports that she could not find her cane or keys this morning. She cannot tell 2015 what she ate, and thinks she is eating the same things repeatedly. She has had some weight loss. She denies leaving the stove on. She is independent with dressing and bathing, she is "always cleaning," and house is well-kept. [name] has noticed that her mood has changed quite a bit, she gets irritated easily. She used to have birds and talked about strangling them because they were so noisy. They got rid of the birds this week. She cusses a lot more than before. No paranoia or hallucinations. She states her mood is okay. She has difficulties with sleep initiation and was  prescribed nortriptyline, but they don't think she filled the prescription.    She denies any headaches, dizziness, diplopia, dysarthria, dysphagia, neck/back pain, focal numbness/tingling/weakness, bowel dysfunction. No anosmia, tremors. She has had diarrhea for "years" occurring 2-3 times a week. She had a bout this morning. Her daughter reports diverticulitis in the past, and that she still eats dairy because she forgets that she should avoid it. She reports a fall this morning, she lost her balance. No injuries. She usually ambulates with a cane. Her mother had Alzheimer's disease. No history of significant head injuries. No alcohol use.   I personally reviewed head CT without contrast done 11/2017 which did not show any acute changes. There was moderate degree of periventricular and subcortical white matter hypoattenuation compatible with chronic microvascular ischemia. Mild atrophy with ventricular sulcal prominence PREVIOUS MEDICATIONS:    CURRENT MEDICATIONS:  Outpatient Encounter Medications as of 03/10/2022  Medication Sig   amLODipine (NORVASC) 10 MG tablet Take 1 tablet (10 mg total) by mouth daily.   aspirin 81 MG tablet Take 81 mg by mouth daily.   diclofenac (VOLTAREN) 50 MG EC tablet Take 1 tablet (50 mg total) by mouth 2 (two) times daily.   FARXIGA 10 MG TABS tablet TAKE 1 TABLET BY MOUTH DAILY BEFORE BREAKFAST   hydrochlorothiazide (HYDRODIURIL) 25 MG tablet Take 1 tablet (25 mg total) by mouth daily.   lovastatin (MEVACOR) 40 MG tablet TAKE 1 TABLET BY MOUTH EVERY NIGHT AT BEDTIME (NEED OFFICE VISIT FOR FUTURE REFILLS)   metFORMIN (GLUCOPHAGE-XR) 750 MG 24 hr tablet Take 1 tablet (750 mg total) by mouth daily with breakfast.   metoprolol succinate (TOPROL-XL) 25 MG 24 hr tablet TAKE 1 TABLET BY MOUTH ONCE A DAY   nortriptyline (PAMELOR) 25 MG capsule Take 1 capsule (25 mg total) by mouth at bedtime.   rivastigmine (EXELON) 3 MG capsule Take 1 capsule (3 mg total) by mouth 2 (two) times daily.   No facility-administered encounter medications on file as of 03/10/2022.       09/10/2017    1:37 PM 09/09/2017    4:00 PM  MMSE - Mini Mental State Exam  Orientation to time 2 2  Orientation to Place 3 3  Registration 2 2  Attention/ Calculation 5 5  Recall 1 1  Language- name 2 objects 2 2  Language- repeat 1 1  Language- follow 3 step command 3 3  Language- read & follow direction 1 1  Write a sentence 1 1  Copy design 1 1  Total score 22 22       No data to display          Objective:     PHYSICAL EXAMINATION:    VITALS:   Vitals:   03/10/22 1123  BP: (!) 126/58  Pulse: 74  Resp: 18  SpO2: 96%  Weight: 128 lb (58.1 kg)  Height: 5\' 2"  (1.575 m)    GEN:  The patient appears stated age and is in NAD. HEENT:  Normocephalic, atraumatic.   Neurological examination:  General: NAD, well-groomed, appears stated age. Orientation: The patient is alert. Oriented to person, place and  date Cranial nerves: There is good facial symmetry.The speech is fluent and clear. No aphasia or dysarthria. Fund of knowledge is appropriate. Recent and remote memory are impaired. Attention and concentration are reduced.  Able to name objects and repeat phrases.  Hearing is intact to conversational tone.    Sensation: Sensation is intact to light touch throughout Motor: Strength  is at least antigravity x4. Tremors: none  DTR's 2/4 in UE/LE     Movement examination: Tone: There is normal tone in the UE/LE Abnormal movements:  no tremor.  No myoclonus.  No asterixis.   Coordination:  There is no decremation with RAM's. Normal finger to nose  Gait and Station: The patient has no difficulty arising out of a deep-seated chair without the use of the hands. The patient's stride length is good.  Gait is cautious and narrow.    Thank you for allowing [place] the opportunity to participate in the care of this nice patient. Please do not hesitate to contact us for any questions or concerns.   Total time spent on today's visit was *** minutes dedicated to this patient today, preparing to see patient, examining the patient, ordering tests and/or medications and counseling the patient, documenting clinical information in the EHR or other health record, independently interpreting results and communicating results to the patient/family, discussing treatment and goals, answering patient's questions and coordinating care.  Cc:  Jacky Kindle, FNP  Marlowe Kays 03/10/2022 11:29 AM

## 2022-03-17 ENCOUNTER — Other Ambulatory Visit: Payer: Self-pay | Admitting: Physician Assistant

## 2022-03-17 DIAGNOSIS — I10 Essential (primary) hypertension: Secondary | ICD-10-CM

## 2022-03-23 IMAGING — CR DG SACRUM/COCCYX 2+V
1 series · 3 of 3 positions shown · non-contrast
Comparison: April 08, 2018.

CLINICAL DATA: Coccygeal pain after fall 2 months ago.

EXAM:
SACRUM AND COCCYX - 2+ VIEW

[Series 1: dg sacrum/coccyx · 0.14mm/px · 3 of 3 slices shown]
[im 1/3]
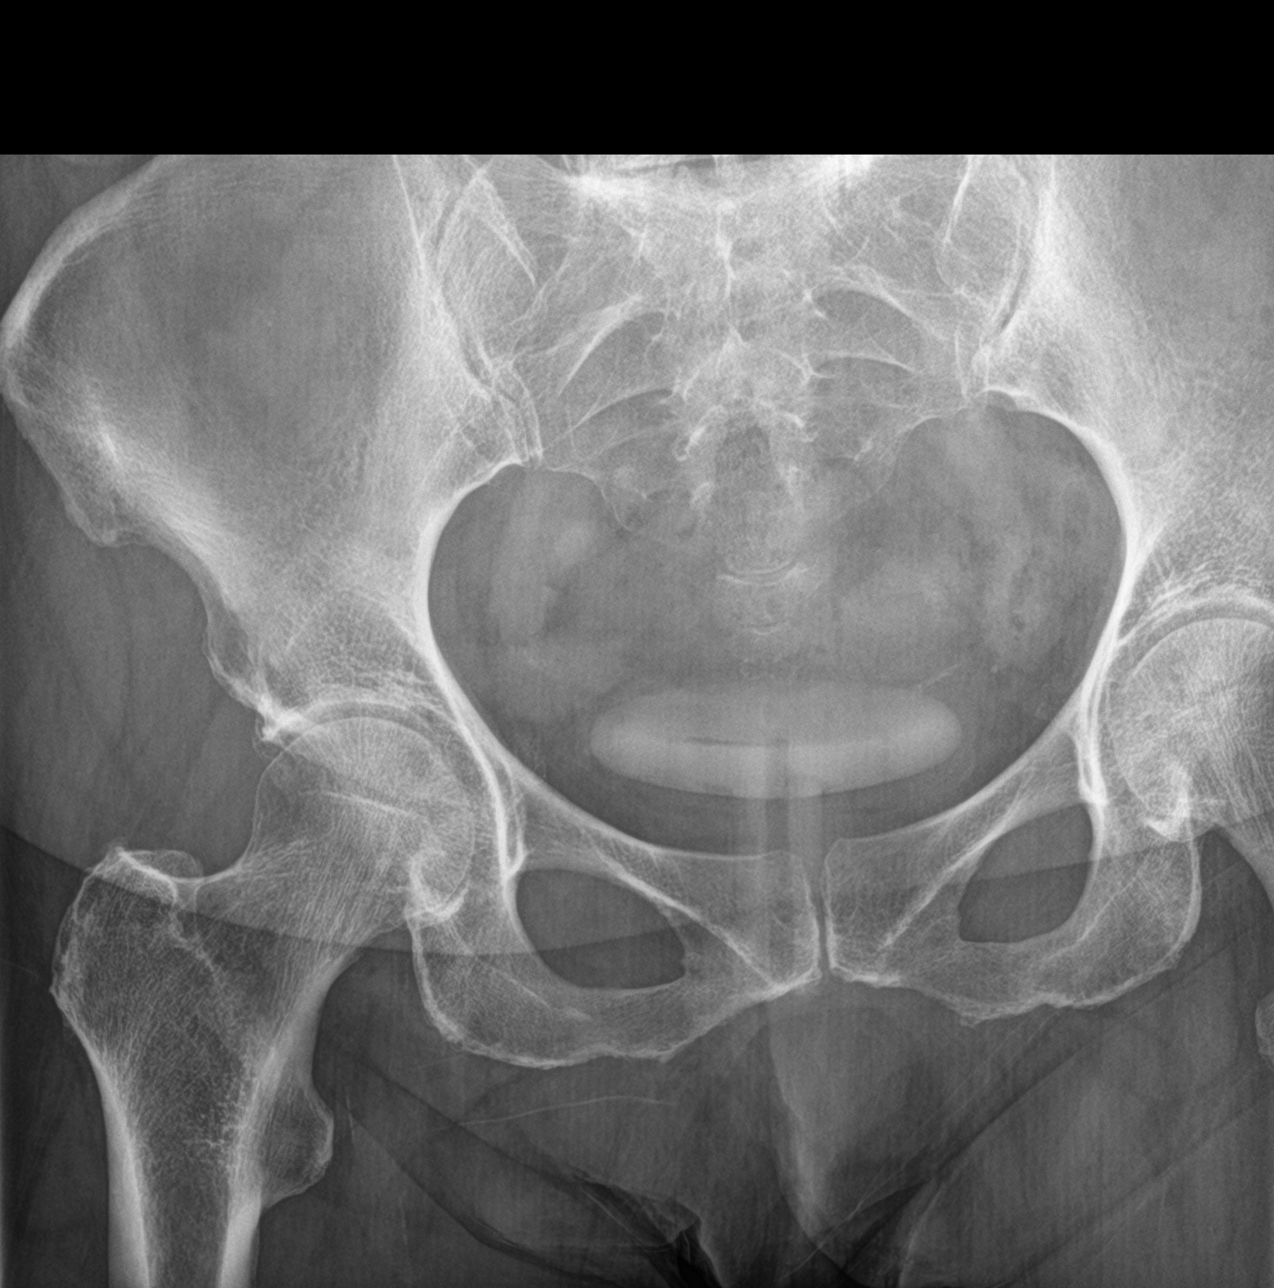
[im 2/3]
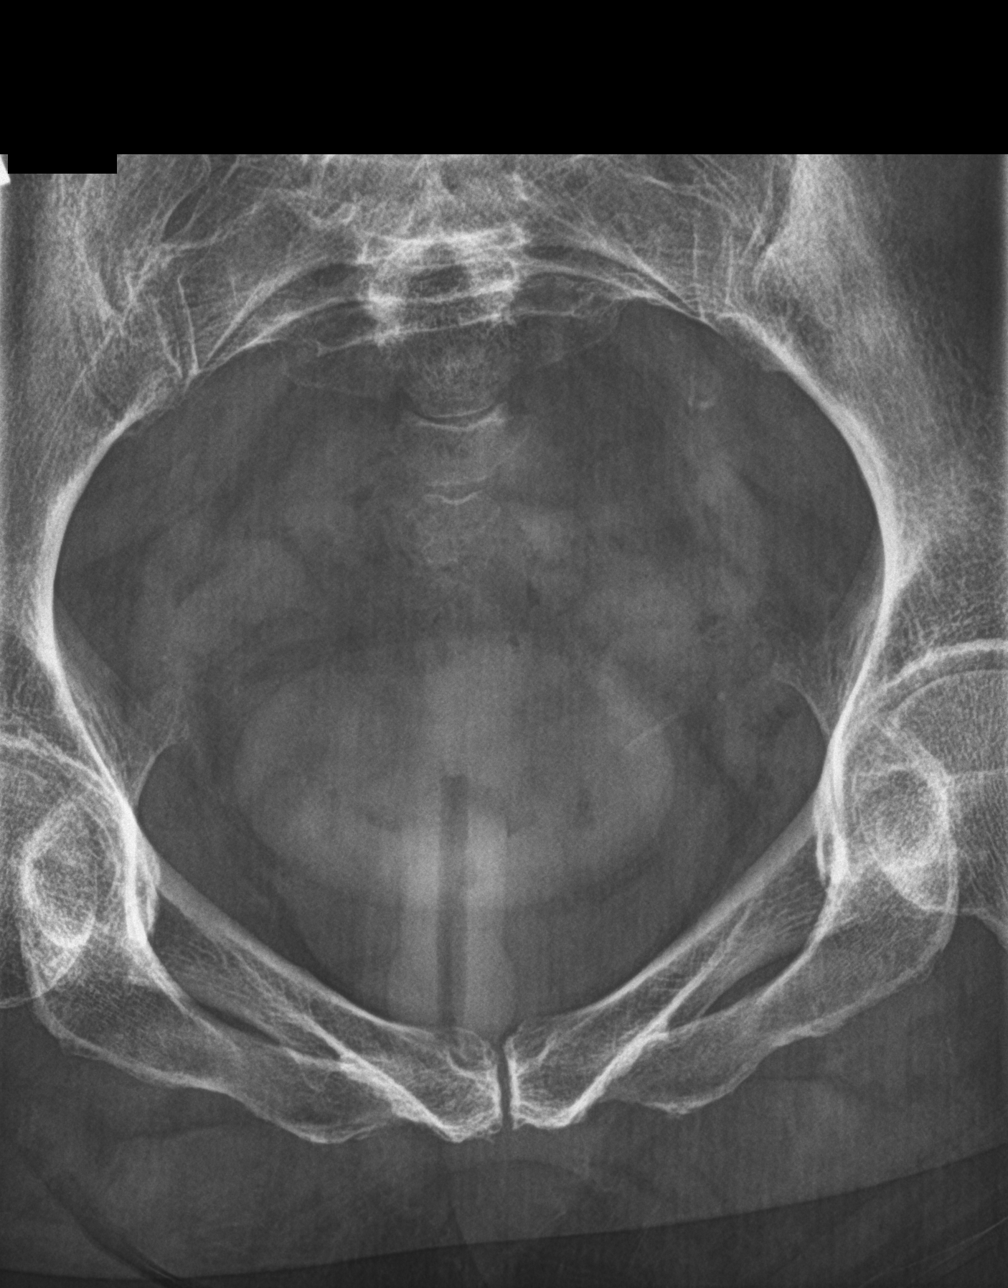
[im 3/3]
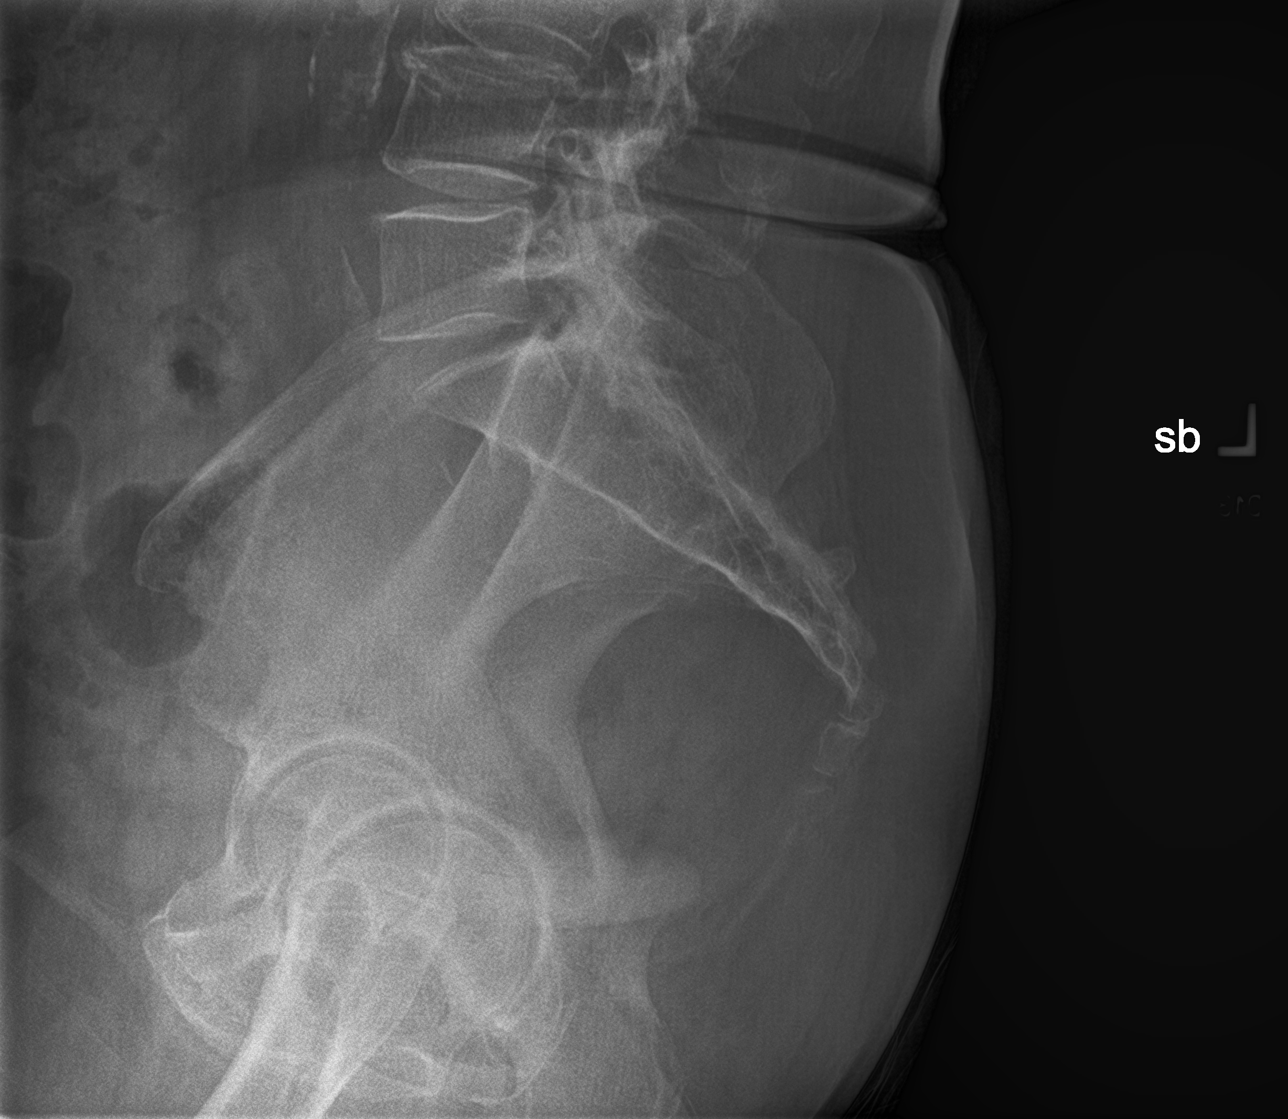

[3 of 3 positions shown; findings below may reference images not displayed]

FINDINGS: There is no evidence of fracture or other focal bone lesions.
IMPRESSION: Negative.

## 2022-04-04 ENCOUNTER — Other Ambulatory Visit: Payer: Self-pay | Admitting: Physician Assistant

## 2022-04-04 DIAGNOSIS — I10 Essential (primary) hypertension: Secondary | ICD-10-CM

## 2022-04-04 NOTE — Telephone Encounter (Signed)
Patient called, left VM to return the call to the office to scheduled an appt for medication refill request.   

## 2022-04-04 NOTE — Telephone Encounter (Signed)
Requested medication (s) are due for refill today: yes  Requested medication (s) are on the active medication list: yes  Last refill:  03/18/22 #30/0  Future visit scheduled: no  Notes to clinic:  pt had courtesy refill. Due for appt, called and LVMTCB     Requested Prescriptions  Pending Prescriptions Disp Refills   metoprolol succinate (TOPROL-XL) 25 MG 24 hr tablet [Pharmacy Med Name: METOPROLOL SUCCINATE ER 25 MG TAB] 30 tablet 0    Sig: TAKE 1 TABLET BY MOUTH ONCE A DAY *MUST HAVE APPT FOR FURTHER REFILLS*     Cardiovascular:  Beta Blockers Passed - 04/04/2022  3:31 PM      Passed - Last BP in normal range    BP Readings from Last 1 Encounters:  03/10/22 (!) 126/58         Passed - Last Heart Rate in normal range    Pulse Readings from Last 1 Encounters:  03/10/22 74         Passed - Valid encounter within last 6 months    Recent Outpatient Visits           3 months ago Essential (primary) hypertension   [facility] Jacky Kindle, FNP   1 year ago Late onset Alzheimer's disease without behavioral disturbance [facility])   [facility] Chrismon, Jodell Cipro, PA-C   2 years ago Right leg pain   [facility] [place], [place], [place]   3 years ago Acute left-sided low back pain with left-sided sciatica   [facility] Brewster, Victorino Dike M, [place]   4 years ago Late onset Alzheimer's disease without behavioral disturbance   [facility], [place], [place]

## 2022-05-09 ENCOUNTER — Telehealth: Payer: Self-pay | Admitting: Family Medicine

## 2022-05-09 NOTE — Telephone Encounter (Signed)
Spoke with patient and [employer] NP. Patient is unable to remember where her meds are or when she took them last. [employer] NP was concerned when they did their yearly home visit and stated that they thought she should not be living alone, is unable to reach daughter. I also called daughter and left a message with no response.

## 2022-05-09 NOTE — Telephone Encounter (Signed)
[employer] NP called to report that the patient has dementia and has not been taking her medication. Cannot remember her last dose. NP says she has tried to contact family members but no response. Pt lives alone.   Best contact: [phone] Merril Abbe NP   Vitals:   BP 148/70 HR 78 Oriented, no distress  Respirations 14

## 2022-05-12 NOTE — Telephone Encounter (Signed)
Noted  

## 2022-05-12 NOTE — Telephone Encounter (Signed)
Called patient's daughter again today with no answer. LMTCB about mother.

## 2022-05-12 NOTE — Telephone Encounter (Signed)
Pt daughter [name] returned call stated wanted to let nurse know she did find pt medication and she did take her medications.   There is no need for concern.   Please advise.

## 2022-05-14 ENCOUNTER — Other Ambulatory Visit: Payer: Self-pay | Admitting: Family Medicine

## 2022-05-14 DIAGNOSIS — G479 Sleep disorder, unspecified: Secondary | ICD-10-CM

## 2022-05-14 DIAGNOSIS — K580 Irritable bowel syndrome with diarrhea: Secondary | ICD-10-CM

## 2022-05-14 NOTE — Telephone Encounter (Signed)
LOV 12-27-21 NOV 07-02-22 LR 10-16-21 #90 with 1 refill

## 2022-06-10 ENCOUNTER — Other Ambulatory Visit: Payer: Self-pay | Admitting: Family Medicine

## 2022-06-10 DIAGNOSIS — G479 Sleep disorder, unspecified: Secondary | ICD-10-CM

## 2022-06-10 DIAGNOSIS — K580 Irritable bowel syndrome with diarrhea: Secondary | ICD-10-CM

## 2022-06-10 DIAGNOSIS — I10 Essential (primary) hypertension: Secondary | ICD-10-CM

## 2022-07-01 NOTE — Progress Notes (Unsigned)
Established patient visit   Patient: Lauren Manning   DOB: 25-Feb-1936   86 y.o. Female  MRN: [id number] Visit Date: 07/02/2022  Today's healthcare provider: [name], FNP  Introduced to nurse practitioner role and practice setting.  All questions answered.  Discussed provider/patient relationship and expectations.  Patient presents with her daughter for OV today  I, J ,acting as a scribe for [name], FNP.,have documented all relevant documentation on the behalf of [name], FNP,as directed by  [name], FNP while in the presence of [name], FNP.   Chief Complaint  Patient presents with   Hypertension   Hyperlipidemia   Subjective    HPI  Hypertension, follow-up  BP Readings from Last 3 Encounters:  07/02/22 133/72  03/10/22 (!) 126/58  12/27/21 (!) 148/78   Wt Readings from Last 3 Encounters:  07/02/22 123 lb 12.8 oz (56.2 kg)  03/10/22 128 lb (58.1 kg)  01/23/22 136 lb (61.7 kg)     She was last seen for hypertension 6 months ago.  BP at that visit was 148/78. Management since that visit includes continue medication.  She reports excellent compliance with treatment. She is not having side effects.  She is following a Regular diet. She is not exercising. She does not smoke.  Use of agents associated with hypertension: none.   Outside blood pressures are . Symptoms: No chest pain No chest pressure  No palpitations No syncope  No dyspnea No orthopnea  No paroxysmal nocturnal dyspnea No lower extremity edema   Pertinent labs Lab Results  Component Value Date   CHOL 157 12/27/2021   HDL 42 12/27/2021   LDLCALC 85 12/27/2021   TRIG 175 (H) 12/27/2021   CHOLHDL 3.7 12/27/2021   Lab Results  Component Value Date   NA 143 12/27/2021   K 3.6 12/27/2021   CREATININE 1.10 (H) 12/27/2021   EGFR 50 (L) 12/27/2021   GLUCOSE 82 12/27/2021   TSH 2.980 11/23/2015     The ASCVD Risk score (Arnett DK, et al., 2019) failed  to calculate for the following reasons:   The 2019 ASCVD risk score is only valid for ages 42 to 85  ---------------------------------------------------------------------------------------------------  Lipid/Cholesterol, Follow-up  Last lipid panel Other pertinent labs  Lab Results  Component Value Date   CHOL 157 12/27/2021   HDL 42 12/27/2021   LDLCALC 85 12/27/2021   TRIG 175 (H) 12/27/2021   CHOLHDL 3.7 12/27/2021   Lab Results  Component Value Date   ALT 18 12/27/2021   AST 21 12/27/2021   PLT 270 12/27/2021   TSH 2.980 11/23/2015     She was last seen for this 6 months ago.  Management since that visit includes continue medication.  She reports excellent compliance with treatment. She is not having side effects.   Symptoms: No chest pain No chest pressure/discomfort  No dyspnea No lower extremity edema  No numbness or tingling of extremity No orthopnea  No palpitations No paroxysmal nocturnal dyspnea  No speech difficulty No syncope   Current diet: not asked Current exercise: none  The ASCVD Risk score (Arnett DK, et al., 2019) failed to calculate for the following reasons:   The 2019 ASCVD risk score is only valid for ages 24 to 16  ---------------------------------------------------------------------------------------------------   Medications: Outpatient Medications Prior to Visit  Medication Sig   aspirin 81 MG tablet Take 81 mg by mouth daily.   diclofenac (VOLTAREN) 50 MG EC  tablet Take 1 tablet (50 mg total) by mouth 2 (two) times daily.   FARXIGA 10 MG TABS tablet TAKE 1 TABLET BY MOUTH DAILY BEFORE BREAKFAST   hydrochlorothiazide (HYDRODIURIL) 25 MG tablet Take 1 tablet (25 mg total) by mouth daily.   lovastatin (MEVACOR) 40 MG tablet TAKE 1 TABLET BY MOUTH EVERY NIGHT AT BEDTIME (NEED OFFICE VISIT FOR FUTURE REFILLS)   metFORMIN (GLUCOPHAGE-XR) 750 MG 24 hr tablet Take 1 tablet (750 mg total) by mouth daily with breakfast.   rivastigmine (EXELON) 3  MG capsule Take 1 capsule (3 mg total) by mouth 2 (two) times daily.   [DISCONTINUED] amLODipine (NORVASC) 10 MG tablet Take 1 tablet (10 mg total) by mouth daily.   [DISCONTINUED] metoprolol succinate (TOPROL-XL) 25 MG 24 hr tablet TAKE 1 TABLET BY MOUTH ONCE A DAY *MUST HAVE APPT FOR FURTHER REFILLS*   [DISCONTINUED] nortriptyline (PAMELOR) 25 MG capsule TAKE 1 CAPSULE BY MOUTH EVERY NIGHT AT BEDTIME   No facility-administered medications prior to visit.    Review of Systems    Objective    BP 133/72 (BP Location: Right Arm, Patient Position: Sitting, Cuff Size: Normal)   Pulse 79   Resp 16   Wt 123 lb 12.8 oz (56.2 kg)   SpO2 99%   BMI 22.64 kg/m   Physical Exam Vitals and nursing note reviewed.  Constitutional:      General: She is not in acute distress.    Appearance: Normal appearance. She is normal weight. She is not ill-appearing, toxic-appearing or diaphoretic.  HENT:     Head: Normocephalic and atraumatic.  Cardiovascular:     Rate and Rhythm: Normal rate and regular rhythm.     Pulses: Normal pulses.     Heart sounds: Normal heart sounds. No murmur heard.    No friction rub. No gallop.  Pulmonary:     Effort: Pulmonary effort is normal. No respiratory distress.     Breath sounds: Normal breath sounds. No stridor. No wheezing, rhonchi or rales.  Chest:     Chest wall: No tenderness.  Musculoskeletal:        General: No swelling, tenderness, deformity or signs of injury. Normal range of motion.     Right lower leg: No edema.     Left lower leg: No edema.  Skin:    General: Skin is warm and dry.     Capillary Refill: Capillary refill takes less than 2 seconds.     Coloration: Skin is not jaundiced or pale.     Findings: No bruising, erythema, lesion or rash.  Neurological:     General: No focal deficit present.     Mental Status: She is alert and oriented to person, place, and time. Mental status is at baseline. She is confused.     Cranial Nerves: No cranial  nerve deficit.     Sensory: No sensory deficit.     Motor: No weakness.     Coordination: Coordination normal.     Gait: Gait is intact.  Psychiatric:        Mood and Affect: Mood normal.        Behavior: Behavior normal.        Thought Content: Thought content normal.        Judgment: Judgment normal.      No results found for any visits on 07/02/22.  Assessment & Plan     Problem List Items Addressed This Visit       Cardiovascular and Mediastinum  Essential hypertension    Chronic, stable Continue current medications: 10 Norvasc, 10 Farxiga, 25 HCTZ, 25 Toprol       Relevant Medications   amLODipine (NORVASC) 10 MG tablet   metoprolol succinate (TOPROL-XL) 25 MG 24 hr tablet   Other Relevant Orders   Comprehensive Metabolic Panel (CMET)     Nervous and Auditory   Late onset Alzheimer's disease without behavioral disturbance (HCC) - Primary    Chronic, stable Previously maintained on exelon 3 mg BID; daughter unclear if pt is still taking this medication Recommend consult back with neurology for refills; next appt in Dec 2023      Relevant Medications   nortriptyline (PAMELOR) 25 MG capsule     Other   Difficulty sleeping    Chronic, stable Continue pamelor 25 mg      Relevant Medications   nortriptyline (PAMELOR) 25 MG capsule   Elevated triglycerides with high cholesterol    Chronic, goal of LDL <100 On Mevacor 40 mg Repeat LP      Relevant Medications   amLODipine (NORVASC) 10 MG tablet   metoprolol succinate (TOPROL-XL) 25 MG 24 hr tablet   Other Relevant Orders   Lipid panel   Hereditary hemochromatosis    Chronic, previously stable Denies concerns; will defer labs for additional 6 months Previously f/b oncology       Need for influenza vaccination    Consented; VIS made available; no immediate side effects following administration; plan to repeat annually       Relevant Orders   Flu Vaccine QUAD High Dose(Fluad) (Completed)    Prediabetes    Chronic, previously stable Repeat A1c Currently using Metformin XR 750 mg QD Goal of <8%      Relevant Orders   Hemoglobin A1c     Return in about 6 months (around 01/01/2023) for annual examination.      Vonna Kotyk, FNP, have reviewed all documentation for this visit. The documentation on 07/02/22 for the exam, diagnosis, procedures, and orders are all accurate and complete.    [name], [place] [phone] (phone) [phone] (fax)  [place]

## 2022-07-02 ENCOUNTER — Ambulatory Visit (INDEPENDENT_AMBULATORY_CARE_PROVIDER_SITE_OTHER): Payer: Medicare Other | Admitting: Family Medicine

## 2022-07-02 ENCOUNTER — Encounter: Payer: Self-pay | Admitting: Family Medicine

## 2022-07-02 VITALS — BP 133/72 | HR 79 | Resp 16 | Wt 123.8 lb

## 2022-07-02 DIAGNOSIS — E782 Mixed hyperlipidemia: Secondary | ICD-10-CM | POA: Diagnosis not present

## 2022-07-02 DIAGNOSIS — R7303 Prediabetes: Secondary | ICD-10-CM | POA: Diagnosis not present

## 2022-07-02 DIAGNOSIS — F0280 Dementia in other diseases classified elsewhere without behavioral disturbance: Secondary | ICD-10-CM | POA: Insufficient documentation

## 2022-07-02 DIAGNOSIS — Z23 Encounter for immunization: Secondary | ICD-10-CM | POA: Diagnosis not present

## 2022-07-02 DIAGNOSIS — G301 Alzheimer's disease with late onset: Secondary | ICD-10-CM

## 2022-07-02 DIAGNOSIS — G479 Sleep disorder, unspecified: Secondary | ICD-10-CM | POA: Insufficient documentation

## 2022-07-02 DIAGNOSIS — I10 Essential (primary) hypertension: Secondary | ICD-10-CM | POA: Insufficient documentation

## 2022-07-02 MED ORDER — AMLODIPINE BESYLATE 10 MG PO TABS: 10.0000 mg | ORAL_TABLET | Freq: Every day | ORAL | 11 refills | Status: DC

## 2022-07-02 MED ORDER — NORTRIPTYLINE HCL 25 MG PO CAPS: 25.0000 mg | ORAL_CAPSULE | Freq: Every day | ORAL | 11 refills | Status: DC

## 2022-07-02 MED ORDER — METOPROLOL SUCCINATE ER 25 MG PO TB24: ORAL_TABLET | ORAL | 11 refills | Status: DC

## 2022-07-02 NOTE — Assessment & Plan Note (Signed)
Chronic, previously stable Denies concerns; will defer labs for additional 6 months Previously f/b oncology

## 2022-07-02 NOTE — Assessment & Plan Note (Signed)
Chronic, stable Previously maintained on exelon 3 mg BID; daughter unclear if pt is still taking this medication Recommend consult back with neurology for refills; next appt in Dec 2023

## 2022-07-02 NOTE — Assessment & Plan Note (Signed)
Chronic, stable Continue pamelor 25 mg

## 2022-07-02 NOTE — Assessment & Plan Note (Signed)
Chronic, goal of LDL <100 On Mevacor 40 mg Repeat LP

## 2022-07-02 NOTE — Assessment & Plan Note (Signed)
Chronic, previously stable Repeat A1c Currently using Metformin XR 750 mg QD Goal of <8%

## 2022-07-02 NOTE — Assessment & Plan Note (Signed)
Consented; VIS made available; no immediate side effects following administration; plan to repeat annually   

## 2022-07-02 NOTE — Assessment & Plan Note (Signed)
Chronic, stable Continue current medications: 10 Norvasc, 10 Farxiga, 25 HCTZ, 25 Toprol

## 2022-07-03 LAB — HEMOGLOBIN A1C
Est. average glucose Bld gHb Est-mCnc: 117 mg/dL
Hgb A1c MFr Bld: 5.7 % — ABNORMAL HIGH (ref 4.8–5.6)

## 2022-07-03 LAB — LIPID PANEL
Chol/HDL Ratio: 3.8 ratio (ref 0.0–4.4)
Cholesterol, Total: 163 mg/dL (ref 100–199)
HDL: 43 mg/dL (ref >39)
LDL Chol Calc (NIH): 80 mg/dL (ref 0–99)
Triglycerides: 243 mg/dL — ABNORMAL HIGH (ref 0–149)
VLDL Cholesterol Cal: 40 mg/dL (ref 5–40)

## 2022-07-03 LAB — COMPREHENSIVE METABOLIC PANEL
ALT: 12 IU/L (ref 0–32)
AST: 20 IU/L (ref 0–40)
Albumin/Globulin Ratio: 1.7 (ref 1.2–2.2)
Albumin: 4.8 g/dL — ABNORMAL HIGH (ref 3.7–4.7)
Alkaline Phosphatase: 68 IU/L (ref 44–121)
BUN/Creatinine Ratio: 16 (ref 12–28)
BUN: 24 mg/dL (ref 8–27)
Bilirubin Total: 0.5 mg/dL (ref 0.0–1.2)
CO2: 27 mmol/L (ref 20–29)
Calcium: 11.1 mg/dL — ABNORMAL HIGH (ref 8.7–10.3)
Chloride: 98 mmol/L (ref 96–106)
Creatinine, Ser: 1.53 mg/dL — ABNORMAL HIGH (ref 0.57–1.00)
Globulin, Total: 2.9 g/dL (ref 1.5–4.5)
Glucose: 96 mg/dL (ref 70–99)
Potassium: 3.5 mmol/L (ref 3.5–5.2)
Sodium: 142 mmol/L (ref 134–144)
Total Protein: 7.7 g/dL (ref 6.0–8.5)
eGFR: 33 mL/min/{1.73_m2} — ABNORMAL LOW (ref >59)

## 2022-07-03 NOTE — Progress Notes (Signed)
Decrease in kidney function noted within last 6 months. Creatinine has increased from 1.1 to 1.5. Filtration rate has decreased from 50 to 33. Encourage water intake of 48-64 oz/day. Recommend repeat of labs in 1 month or referral to nephrology if elevation remains.  A1c is stable as is cholesterol.  [name], [address] [place], [place] [address] [phone] (phone) [phone] (fax) [place]

## 2022-07-08 ENCOUNTER — Encounter: Payer: Medicare Other | Admitting: Obstetrics and Gynecology

## 2022-07-10 ENCOUNTER — Telehealth: Payer: Self-pay

## 2022-07-10 NOTE — Telephone Encounter (Signed)
Pt's daughter [name] given lab results per notes of Daneil Dan, NP on 07/10/22. [name] verbalized understanding.

## 2022-07-10 NOTE — Addendum Note (Signed)
Addended by: Smitty Knudsen on: 07/10/2022 03:28 PM   Modules accepted: Orders

## 2022-07-11 ENCOUNTER — Other Ambulatory Visit: Payer: Self-pay | Admitting: Family Medicine

## 2022-07-11 DIAGNOSIS — N1832 Chronic kidney disease, stage 3b: Secondary | ICD-10-CM | POA: Insufficient documentation

## 2022-07-13 ENCOUNTER — Emergency Department
Admission: EM | Admit: 2022-07-13 | Discharge: 2022-07-13 | Disposition: A | Discharge status: Home / Self Care | Payer: Medicare Other | Attending: Emergency Medicine | Admitting: Emergency Medicine

## 2022-07-13 ENCOUNTER — Other Ambulatory Visit: Payer: Self-pay

## 2022-07-13 DIAGNOSIS — R1110 Vomiting, unspecified: Secondary | ICD-10-CM | POA: Diagnosis present

## 2022-07-13 DIAGNOSIS — E876 Hypokalemia: Secondary | ICD-10-CM

## 2022-07-13 DIAGNOSIS — G309 Alzheimer's disease, unspecified: Secondary | ICD-10-CM | POA: Insufficient documentation

## 2022-07-13 DIAGNOSIS — I10 Essential (primary) hypertension: Secondary | ICD-10-CM | POA: Diagnosis not present

## 2022-07-13 DIAGNOSIS — N390 Urinary tract infection, site not specified: Secondary | ICD-10-CM | POA: Insufficient documentation

## 2022-07-13 LAB — TROPONIN I (HIGH SENSITIVITY)
Troponin I (High Sensitivity): 5 ng/L (ref <18)
Troponin I (High Sensitivity): 6 ng/L (ref <18)

## 2022-07-13 LAB — URINALYSIS, ROUTINE W REFLEX MICROSCOPIC
Bilirubin Urine: NEGATIVE
Glucose, UA: >=500 mg/dL — AB
Ketones, ur: NEGATIVE mg/dL
Nitrite: NEGATIVE
Protein, ur: 30 mg/dL — AB
Specific Gravity, Urine: 1.001 — ABNORMAL LOW (ref 1.005–1.030)
WBC, UA: >50 WBC/hpf — ABNORMAL HIGH (ref 0–5)
pH: 7 (ref 5.0–8.0)

## 2022-07-13 LAB — COMPREHENSIVE METABOLIC PANEL
ALT: 17 U/L (ref 0–44)
AST: 25 U/L (ref 15–41)
Albumin: 4.1 g/dL (ref 3.5–5.0)
Alkaline Phosphatase: 60 U/L (ref 38–126)
Anion gap: 13 (ref 5–15)
BUN: 19 mg/dL (ref 8–23)
CO2: 23 mmol/L (ref 22–32)
Calcium: 9.6 mg/dL (ref 8.9–10.3)
Chloride: 100 mmol/L (ref 98–111)
Creatinine, Ser: 1.18 mg/dL — ABNORMAL HIGH (ref 0.44–1.00)
GFR, Estimated: 46 mL/min — ABNORMAL LOW (ref >60)
Glucose, Bld: 147 mg/dL — ABNORMAL HIGH (ref 70–99)
Potassium: 2.7 mmol/L — CL (ref 3.5–5.1)
Sodium: 136 mmol/L (ref 135–145)
Total Bilirubin: 0.8 mg/dL (ref 0.3–1.2)
Total Protein: 8 g/dL (ref 6.5–8.1)

## 2022-07-13 LAB — CBC
HCT: 51.3 % — ABNORMAL HIGH (ref 36.0–46.0)
Hemoglobin: 17 g/dL — ABNORMAL HIGH (ref 12.0–15.0)
MCH: 29.8 pg (ref 26.0–34.0)
MCHC: 33.1 g/dL (ref 30.0–36.0)
MCV: 89.8 fL (ref 80.0–100.0)
Platelets: 316 10*3/uL (ref 150–400)
RBC: 5.71 MIL/uL — ABNORMAL HIGH (ref 3.87–5.11)
RDW: 13.7 % (ref 11.5–15.5)
WBC: 7.9 10*3/uL (ref 4.0–10.5)
nRBC: 0 % (ref 0.0–0.2)

## 2022-07-13 LAB — LIPASE, BLOOD: Lipase: 39 U/L (ref 11–51)

## 2022-07-13 MED ORDER — SODIUM CHLORIDE 0.9 % IV SOLN: Freq: Once | INTRAVENOUS | Status: AC

## 2022-07-13 MED ORDER — POTASSIUM CHLORIDE ER 10 MEQ PO TBCR: 10.0000 meq | EXTENDED_RELEASE_TABLET | Freq: Two times a day (BID) | ORAL | 0 refills | Status: DC

## 2022-07-13 MED ORDER — CEPHALEXIN 500 MG PO CAPS: 500.0000 mg | ORAL_CAPSULE | Freq: Two times a day (BID) | ORAL | 0 refills | Status: AC

## 2022-07-13 MED ORDER — POTASSIUM CHLORIDE CRYS ER 20 MEQ PO TBCR
20.0000 meq | EXTENDED_RELEASE_TABLET | Freq: Once | ORAL | Status: AC
  Administered 2022-07-13: 20 meq via ORAL
  Filled 2022-07-13: qty 1

## 2022-07-13 MED ORDER — POTASSIUM CHLORIDE 10 MEQ/100ML IV SOLN
10.0000 meq | Freq: Once | INTRAVENOUS | Status: AC
  Administered 2022-07-13: 10 meq via INTRAVENOUS
  Filled 2022-07-13: qty 100

## 2022-07-13 MED ORDER — SODIUM CHLORIDE 0.9 % IV SOLN
1.0000 g | Freq: Once | INTRAVENOUS | Status: AC
  Administered 2022-07-13: 1 g via INTRAVENOUS
  Filled 2022-07-13: qty 10

## 2022-07-13 MED ORDER — SODIUM CHLORIDE 0.9 % IV BOLUS
500.0000 mL | Freq: Once | INTRAVENOUS | Status: AC
  Administered 2022-07-13: 500 mL via INTRAVENOUS

## 2022-07-13 NOTE — ED Notes (Signed)
Pt ambulated to toilet and back to bed. 

## 2022-07-13 NOTE — ED Provider Notes (Signed)
[facility] Provider Note    Event Date/Time   First MD Initiated Contact with Patient 07/13/22 1624     (approximate)   History   Emesis and Hypertension   HPI  Lauren Manning is a 86 y.o. female  with a history of hypertension, hyperlipidemia, and Alzheimer's who presents with elevated blood pressure, headache, and vomiting.  The daughter states that the patient was at church when she apparently had a couple of episodes of vomiting and complained of a headache.  The daughter was not there but states that the pastor's wife checked the patient's blood pressure and it was as high as the 180s over 90s.  The patient states she is now feeling much better.  She denies any headache or nausea at this time.  She had no weakness or numbness and no vision changes.  She has no fever or chills.  She states she was feeling fine prior to this episode.  I reviewed the past medical records.  The patient was most recently seen by primary care on 10/11 routine visit.  There were no acute issues at that time.  She has no recent ED visits or admissions.   Physical Exam   Triage Vital Signs: ED Triage Vitals  Enc Vitals Group     BP 07/13/22 1340 (!) 166/78     Pulse Rate 07/13/22 1340 79     Resp 07/13/22 1340 16     Temp --      Temp src --      SpO2 07/13/22 1340 97 %     Weight 07/13/22 1341 123 lb 14.4 oz (56.2 kg)     Height 07/13/22 1341 5\' 2"  (1.575 m)     Head Circumference --      Peak Flow --      Pain Score 07/13/22 1341 2     Pain Loc --      Pain Edu? --      Excl. in GC? --     Most recent vital signs: Vitals:   07/13/22 1738 07/13/22 1851  BP: (!) 150/86 (!) 180/75  Pulse: 72 75  Resp: 16 16  Temp: 97.8 F (36.6 C)   SpO2: 98% 98%     General: Alert, oriented x2, well-appearing. CV:  Good peripheral perfusion.  Resp:  Normal effort.  Abd:  Soft and nontender.  No distention.  Other:  EOMI.  PERRLA.  Motor intact in all extremities.  No  ataxia on finger-to-nose.  No pronator drift.  No facial droop.  Slightly dry mucous membranes.   ED Results / Procedures / Treatments   Labs (all labs ordered are listed, but only abnormal results are displayed) Labs Reviewed  COMPREHENSIVE METABOLIC PANEL - Abnormal; Notable for the following components:      Result Value   Potassium 2.7 (*)    Glucose, Bld 147 (*)    Creatinine, Ser 1.18 (*)    GFR, Estimated 46 (*)    All other components within normal limits  CBC - Abnormal; Notable for the following components:   RBC 5.71 (*)    Hemoglobin 17.0 (*)    HCT 51.3 (*)    All other components within normal limits  URINALYSIS, ROUTINE W REFLEX MICROSCOPIC - Abnormal; Notable for the following components:   Color, Urine YELLOW (*)    APPearance HAZY (*)    Specific Gravity, Urine 1.001 (*)    Glucose, UA >=500 (*)    Hgb urine  dipstick MODERATE (*)    Protein, ur 30 (*)    Leukocytes,Ua LARGE (*)    WBC, UA >50 (*)    Bacteria, UA FEW (*)    All other components within normal limits  LIPASE, BLOOD  TROPONIN I (HIGH SENSITIVITY)  TROPONIN I (HIGH SENSITIVITY)     EKG  ED ECG REPORT I, Dionne Bucy, the attending physician, personally viewed and interpreted this ECG.  Date: 07/13/2022 EKG Time: 1345 Rate: 84 Rhythm: normal sinus rhythm QRS Axis: normal Intervals: RBBB, prolonged QTc ST/T Wave abnormalities: Nonspecific T wave abnormalities Narrative Interpretation: no evidence of acute ischemia; no significant change when compared to EKG of 11/23/2017   ED ECG REPORT I, Dionne Bucy, the attending physician, personally viewed and interpreted this ECG.  Date: 07/13/2022 EKG Time: 2037 Rate: 81 Rhythm: normal sinus rhythm QRS Axis: normal Intervals: RBBB, borderline QTc ST/T Wave abnormalities: normal Narrative Interpretation: no evidence of acute ischemia; improved QTc   RADIOLOGY    PROCEDURES:  Critical Care performed:  No  Procedures   MEDICATIONS ORDERED IN ED: Medications  cefTRIAXone (ROCEPHIN) 1 g in sodium chloride 0.9 % 100 mL IVPB (1 g Intravenous New Bag/Given 07/13/22 2019)  sodium chloride 0.9 % bolus 500 mL (0 mLs Intravenous Stopped 07/13/22 1810)  potassium chloride 10 mEq in 100 mL IVPB (0 mEq Intravenous Stopped 07/13/22 1832)  potassium chloride 10 mEq in 100 mL IVPB (0 mEq Intravenous Stopped 07/13/22 2008)  potassium chloride SA (KLOR-CON M) CR tablet 20 mEq (20 mEq Oral Given 07/13/22 1645)  0.9 %  sodium chloride infusion (0 mLs Intravenous Stopped 07/13/22 2008)     IMPRESSION / MDM / ASSESSMENT AND PLAN / ED COURSE  I reviewed the triage vital signs and the nursing notes.  86 year old female with PMH as noted above presents with vomiting associated with headache and elevated blood pressure while at church earlier.  The symptoms have subsequently resolved.  The daughter states that the patient may not have taken her blood pressure medication this morning.  On exam the patient is well-appearing and her neurologic exam is normal. was soft and nontender.  Blood pressure has been trending down without intervention.  Overall I suspect that the elevated blood pressure was likely related to the headache and hypertension rather than the symptoms being caused by hypertensive crisis.  Differential diagnosis includes, but is not limited to, GERD, gastritis, indigestion, vasovagal near syncope, electrolyte abnormality, other metabolic disturbance, less likely cardiac cause.  Given the lack of headache currently and the normal neurologic exam there is no evidence of CNS etiology.  Initial basic labs reveal hypokalemia as well as an elevated hemoglobin consistent with some hemoconcentration.  Troponin is negative.  We will give fluids, potassium, obtain a repeat troponin and reassess.  There is no indication for imaging at this time.  Patient's presentation is most consistent with acute  presentation with potential threat to life or bodily function.  The patient is on the cardiac monitor to evaluate for evidence of arrhythmia and/or significant heart rate changes.   ----------------------------------------- 8:29 PM on 07/13/2022 -----------------------------------------  Repeat troponin is negative.  Urinalysis shows evidence of UTI.  Potassium and fluids have run in.  The patient remains asymptomatic and states she feels fine.  I counseled the patient and daughter on the results of the work-up.  I did consider whether the patient may benefit from inpatient admission given her age and dementia as well as the presence of UTI and electrolyte abnormality, however  her vital signs have been stable, she is well-appearing, has been asymptomatic for the duration of her ED stay, and is tolerating p.o.  Therefore I feel that discharge is also reasonable.  The patient and daughter would prefer to go home.  I counseled them on the importance of close outpatient follow-up with the PMD this week to have the potassium rechecked.  EKG did show a borderline prolonged QT interval but repeat after potassium repletion in the ED has improved and the patient has no other signs or symptoms of hypokalemia.  Therefore, there is no indication to wait in the ED for a potassium recheck.  Strict return precautions provided and the daughter expressed understanding.    FINAL CLINICAL IMPRESSION(S) / ED DIAGNOSES   Final diagnoses:  Urinary tract infection without hematuria, site unspecified  Hypertension, unspecified type  Hypokalemia     Rx / DC Orders   ED Discharge Orders          Ordered    cephALEXin (KEFLEX) 500 MG capsule  2 times daily        07/13/22 2028    potassium chloride (KLOR-CON) 10 MEQ tablet  2 times daily        07/13/22 2028             Note:  This document was prepared using Dragon voice recognition software and may include unintentional dictation errors.     Arta Silence, MD 07/13/22 2043

## 2022-07-13 NOTE — ED Triage Notes (Signed)
Pt presents via POV c/o N/V, headache, and hypertension. Daughter reports was called while patient was at church and stated she wasn't feeling well, her BP was elevated, had headache with nausea/vomiting.

## 2022-07-13 NOTE — ED Notes (Signed)
RN to bedside to introduce self to pt. Pt is CAOx3 and at baseline per daughter. Daughter advised pt lives at home alone and cares for herself. Pt does have dementia. Daughter advised the patients medications for today were still in the pack and she thinks her BP was high due to her not taking her medications this morning. Daughter was educated as to why it is important that when a patient has dementia, a family member should oversee med admin because a patient can overdose on those meds or not take them. Daughter understood.

## 2022-07-13 NOTE — ED Notes (Signed)
Pt ambulated to toilet and back.  

## 2022-07-13 NOTE — ED Notes (Signed)
Pt assistaed to toilet with assistance and back to bed.

## 2022-07-13 NOTE — Discharge Instructions (Signed)
Follow-up with your primary care provider this week to have the potassium rechecked.  Take the antibiotic as prescribed and finish the full course.  Return to the ER for new, worsening, or persistent severe nausea or vomiting, headache, weakness, lightheadedness, passing out, fever, or any other new or worsening symptoms that concern you.

## 2022-07-13 NOTE — ED Notes (Signed)
Signature pad not working.  Patient and family verbalize understanding.

## 2022-07-13 NOTE — ED Notes (Signed)
This Rn attempted IV x2 with no success.

## 2022-07-22 ENCOUNTER — Encounter: Payer: Self-pay | Admitting: Obstetrics and Gynecology

## 2022-07-22 ENCOUNTER — Ambulatory Visit: Payer: Medicare Other | Admitting: Obstetrics and Gynecology

## 2022-07-22 VITALS — BP 160/81 | HR 86 | Resp 16 | Ht 62.0 in | Wt 124.2 lb

## 2022-07-22 DIAGNOSIS — Z4689 Encounter for fitting and adjustment of other specified devices: Secondary | ICD-10-CM

## 2022-07-22 DIAGNOSIS — N8110 Cystocele, unspecified: Secondary | ICD-10-CM

## 2022-07-22 DIAGNOSIS — N952 Postmenopausal atrophic vaginitis: Secondary | ICD-10-CM | POA: Diagnosis not present

## 2022-07-22 NOTE — Progress Notes (Signed)
    GYNECOLOGY PROGRESS NOTE  Subjective:    Patient ID: Lauren Manning, female    DOB: 05/21/1936, 86 y.o.   MRN: [id number]  HPI  Patient is a 86 y.o. G30P2002 female who presents for follow up for Pessary Check.  Last pessary check was 12/04/2021. She reports no vaginal bleeding or discharge. She denies pelvic discomfort and difficulty urinating or moving her bowels.    The following portions of the patient's history were reviewed and updated as appropriate: allergies, current medications, past family history, past medical history, past social history, past surgical history, and problem list.  Review of Systems Pertinent items are noted in HPI.   Objective:   Blood pressure (!) 160/81, pulse 86, resp. rate 16, height 5\' 2"  (1.575 m), weight 124 lb 3.2 oz (56.3 kg).  Body mass index is 22.72 kg/m. General appearance: alert, cooperative, and no distress Pelvic: The patient's size 2 3/4 Gelhorn pessary was removed, cleaned and replaced without complications. Speculum examination revealed vaginal mucosa with moderate vaginal atrophy present. Small amount of bleeding with pessary removal.    Assessment:   1. Encounter for pessary maintenance   2. Vaginal atrophy   3. Baden-Walker grade 3 cystocele     Plan:   - The patient should return in 4-6 months for a pessary check.    - Strongly encouraged to utilize Premarin cream for atrophy and greater ease of pessary removal.    Rubie Maid, MD [place]

## 2022-08-01 ENCOUNTER — Other Ambulatory Visit: Payer: Self-pay

## 2022-08-01 ENCOUNTER — Telehealth: Payer: Self-pay | Admitting: Anesthesiology

## 2022-08-01 DIAGNOSIS — G301 Alzheimer's disease with late onset: Secondary | ICD-10-CM

## 2022-08-01 DIAGNOSIS — R4189 Other symptoms and signs involving cognitive functions and awareness: Secondary | ICD-10-CM

## 2022-08-01 DIAGNOSIS — F0280 Dementia in other diseases classified elsewhere without behavioral disturbance: Secondary | ICD-10-CM

## 2022-08-01 MED ORDER — RIVASTIGMINE TARTRATE 3 MG PO CAPS: 3.0000 mg | ORAL_CAPSULE | Freq: Two times a day (BID) | ORAL | 2 refills | Status: DC

## 2022-08-01 NOTE — Telephone Encounter (Signed)
Called pharmacy and it was the rivastigmine and it was sent to the pharmacy

## 2022-08-01 NOTE — Telephone Encounter (Signed)
Patients pharmacy left message requesting a refill on medication. Did not say which medication.

## 2022-09-03 ENCOUNTER — Other Ambulatory Visit: Payer: Self-pay | Admitting: Family Medicine

## 2022-09-09 ENCOUNTER — Encounter: Payer: Self-pay | Admitting: Family Medicine

## 2022-09-09 ENCOUNTER — Ambulatory Visit (INDEPENDENT_AMBULATORY_CARE_PROVIDER_SITE_OTHER): Payer: Medicare Other | Admitting: Family Medicine

## 2022-09-09 ENCOUNTER — Encounter: Payer: Self-pay | Admitting: Physician Assistant

## 2022-09-09 ENCOUNTER — Ambulatory Visit: Payer: Medicare Other | Admitting: Physician Assistant

## 2022-09-09 VITALS — BP 151/79 | HR 86 | Resp 20 | Ht 62.0 in | Wt 122.0 lb

## 2022-09-09 VITALS — BP 149/77 | HR 90 | Temp 97.7°F | Wt 122.9 lb

## 2022-09-09 DIAGNOSIS — G309 Alzheimer's disease, unspecified: Secondary | ICD-10-CM

## 2022-09-09 DIAGNOSIS — I10 Essential (primary) hypertension: Secondary | ICD-10-CM

## 2022-09-09 DIAGNOSIS — F0280 Dementia in other diseases classified elsewhere without behavioral disturbance: Secondary | ICD-10-CM | POA: Diagnosis not present

## 2022-09-09 DIAGNOSIS — R300 Dysuria: Secondary | ICD-10-CM | POA: Insufficient documentation

## 2022-09-09 LAB — POCT URINALYSIS DIPSTICK
Bilirubin, UA: NEGATIVE
Glucose, UA: POSITIVE — AB
Ketones, UA: NEGATIVE
Nitrite, UA: NEGATIVE
Protein, UA: POSITIVE — AB
Spec Grav, UA: 1.015 (ref 1.010–1.025)
Urobilinogen, UA: NEGATIVE E.U./dL — AB
pH, UA: 6 (ref 5.0–8.0)

## 2022-09-09 MED ORDER — SULFAMETHOXAZOLE-TRIMETHOPRIM 800-160 MG PO TABS: 1.0000 | ORAL_TABLET | Freq: Two times a day (BID) | ORAL | 0 refills | Status: DC

## 2022-09-09 NOTE — Progress Notes (Signed)
Assessment/Plan:   Dementia likely due to Alzheimer's disease   Lauren Manning is a very pleasant 86 y.o. RH female with a history of hypertension, hyperlipidemia, stage III CKD seen today in follow up for memory loss.  She has a diagnosis of major neurocognitive disorder likely due to Alzheimer's disease per neuropsychological evaluation on December 2021.  Patient is currently on rivastigmine 3 mg twice daily, tolerating well.  MMSE today is 19/30, with slight change since the year 2018 (MMSE 22/30).  Discussed with her daughter regarding increasing rivastigmine as her delayed recall today was 0/3, although, as no other major changes are noted, it was felt prudent not to increase the dose at this time.    Follow up in 6 months. Continue rivastigmine 3 mg twice daily, side effects discussed Continue to control mood as per PCP Continue good control of cardiovascular risk factors, patient has been informed of her blood pressure today at 151/79.  She has an appointment with her PCP soon.    Subjective:    This patient is accompanied in the office by her daughter who supplements the history.  Previous records as well as any outside records available were reviewed prior to todays visit.  She was last seen on 03/10/2022.  She is on rivastigmine 3 mg twice daily.   Any changes in memory since last visit?  "About the same ". repeats oneself?  Endorsed Disoriented when walking into a room?  Patient denies except occasionally not remembering what patient came to the room for   Leaving objects in unusual places?  Patient denies   Wandering behavior?  About 1 week ago, she was exiting [employer] store and became confusing which way to go home and to a different direction.  Someone has to bring her home.  Daughter suspects that it may have been a second time doing it but was unable to confirm.  Any changes since last visit?  Patient denies   Any worsening depression?:  Patient denies    Hallucinations or paranoia?  Patient denies   Seizures?   Patient denies    Any sleep changes?  Denies vivid dreams, REM behavior or sleepwalking   Sleep apnea?  Patient denies   Any hygiene concerns?  Patient denies   Independent of bathing and dressing?  Endorsed  Does the patient needs help with medications?  Daughter and granddaughter are monitoring it, the medicines, and bubble packs.   Who is in charge of the finances?  Daughter in charge bills Any changes in appetite?  Patient denies.  She may forget at times if she ate.  Her daughter is staffing her with some microwavable products that she would be able to eat, and sometimes the patient makes a peanut butter sandwich or other easy foods.  She drinks about 3 or 4 glasses of water a day.  Patient have trouble swallowing? Patient denies   Does the patient cook?  Any kitchen accidents such as leaving the stove on? Patient denies   Any headaches?  Patient denies   Chronic back pain Patient denies   Ambulates with difficulty?  The patient uses a cane most of the time, but lost it 2 days ago.  She also uses a walker occasionally.   Recent falls or head injuries?  Patient denies     Unilateral weakness, numbness or tingling?  Patient denies   Any tremors?  Patient denies   Any anosmia?  Patient denies   Any incontinence of urine?  She  had a UTI before Thanksgiving and another one has noticed one episode of urinating blood, and is to be seen at the PCP today.  Unclear if she has a history of kidney stones. Any bowel dysfunction?   Patient denies      Patient lives   Alone  Does the patient drive?no longer drives      History on Initial Assessment 11/23/2019: This is an 86 year old right-handed woman with a history of hypertension, hyperlipidemia, Alzheimer's disease, presenting for evaluation of dementia. Her daughter Lauren Manning is present to provide additional information. Family started noticing memory changes several years ago. She underwent  Neuropsychological testing with Dr. Si Raider in April 2018 with a diagnosis of Mild dementia most likely secondary to Alzheimer's disease, without behavioral disturbance, Mild depression. Cognitive profile is suggestive of medial-temporal lobe involvement. She had been on Donepezil 5mg  daily, dose was increased to 10mg  daily. It was noted that she should continue to receive assistance with complex ADLs, and would be well suited for a continuing care retirement community or assisted living where she could have increased oversight of medications and meals. She continues to live alone in a senior living apartment complex where her neighbors look out for her. She manages her own medications and denies missing any doses. Lauren Manning works close by and comes for lunch sometimes, and sees her pillbox would be empty, but states she did not take her medications this morning. One time, a neighbor called, they apparently called EMS because she went to her neighbor saying she thinks she took her medication twice, EMS checked her BP and she seemed fine. She stopped driving in [phone]. Lauren Manning took over finances in 2014. She denies misplacing things, however Lauren Manning reports that she could not find her cane or keys this morning. She cannot tell Lauren Manning what she ate, and thinks she is eating the same things repeatedly. She has had some weight loss. She denies leaving the stove on. She is independent with dressing and bathing, she is "always cleaning," and house is well-kept. Lauren Manning has noticed that her mood has changed quite a bit, she gets irritated easily. She used to have birds and talked about strangling them because they were so noisy. They got rid of the birds this week. She cusses a lot more than before. No paranoia or hallucinations. She states her mood is okay. She has difficulties with sleep initiation and was prescribed nortriptyline, but they don't think she filled the prescription.    She denies any headaches, dizziness,  diplopia, dysarthria, dysphagia, neck/back pain, focal numbness/tingling/weakness, bowel dysfunction. No anosmia, tremors. She has had diarrhea for "years" occurring 2-3 times a week. She had a bout this morning. Her daughter reports diverticulitis in the past, and that she still eats dairy because she forgets that she should avoid it. She reports a fall this morning, she lost her balance. No injuries. She usually ambulates with a cane. Her mother had Alzheimer's disease. No history of significant head injuries. No alcohol use.   I personally reviewed head CT without contrast done 11/2017 which did not show any acute changes. There was moderate degree of periventricular and subcortical white matter hypoattenuation compatible with chronic microvascular ischemia. Mild atrophy with ventricular sulcal prominence PREVIOUS MEDICATIONS:   CURRENT MEDICATIONS:  Outpatient Encounter Medications as of 09/09/2022  Medication Sig   amLODipine (NORVASC) 10 MG tablet Take 1 tablet (10 mg total) by mouth daily.   aspirin 81 MG tablet Take 81 mg by mouth daily.   diclofenac (  VOLTAREN) 50 MG EC tablet Take 1 tablet (50 mg total) by mouth 2 (two) times daily.   FARXIGA 10 MG TABS tablet TAKE 1 TABLET BY MOUTH DAILY BEFORE BREAKFAST   hydrochlorothiazide (HYDRODIURIL) 25 MG tablet Take 1 tablet (25 mg total) by mouth daily.   lovastatin (MEVACOR) 40 MG tablet TAKE 1 TABLET BY MOUTH EVERY NIGHT AT BEDTIME (NEED OFFICE VISIT FOR FUTURE REFILLS)   metFORMIN (GLUCOPHAGE-XR) 750 MG 24 hr tablet Take 1 tablet (750 mg total) by mouth daily with breakfast.   metoprolol succinate (TOPROL-XL) 25 MG 24 hr tablet TAKE 1 TABLET BY MOUTH ONCE A DAY *MUST HAVE APPT FOR FURTHER REFILLS*   nortriptyline (PAMELOR) 25 MG capsule Take 1 capsule (25 mg total) by mouth at bedtime.   potassium chloride (KLOR-CON) 10 MEQ tablet Take 1 tablet (10 mEq total) by mouth 2 (two) times daily for 3 days.   rivastigmine (EXELON) 3 MG capsule Take 1  capsule (3 mg total) by mouth 2 (two) times daily.   No facility-administered encounter medications on file as of 09/09/2022.       09/10/2017    1:37 PM 09/09/2017    4:00 PM  MMSE - Mini Mental State Exam  Orientation to time 2 2  Orientation to Place 3 3  Registration 2 2  Attention/ Calculation 5 5  Recall 1 1  Language- name 2 objects 2 2  Language- repeat 1 1  Language- follow 3 step command 3 3  Language- read & follow direction 1 1  Write a sentence 1 1  Copy design 1 1  Total score 22 22       No data to display          Objective:     PHYSICAL EXAMINATION:    VITALS:  There were no vitals filed for this visit.  GEN:  The patient appears stated age and is in NAD. HEENT:  Normocephalic, atraumatic.   Neurological examination:  General: NAD, well-groomed, appears stated age. Orientation: The patient is alert. Oriented to person, place and date Cranial nerves: There is good facial symmetry.The speech is fluent and clear. No aphasia or dysarthria. Fund of knowledge is appropriate. Recent and remote memory are impaired. Attention and concentration are reduced.  Able to name objects and repeat phrases.  Hearing is intact to conversational tone.   Delayed recall is 0/3 Sensation: Sensation is intact to light touch throughout Motor: Strength is at least antigravity x4. DTR's 2/4 in UE/LE     Movement examination: Tone: There is normal tone in the UE/LE Abnormal movements:  no tremor.  No myoclonus.  No asterixis.   Coordination:  There is no decremation with RAM's. Normal finger to nose  Gait and Station: The patient has no difficulty arising out of a deep-seated chair without the use of the hands. The patient's stride length is good.  Gait is cautious and narrow.  Kyphosis is noted.  Thank you for allowing [place] the opportunity to participate in the care of this nice patient. Please do not hesitate to contact us for any questions or concerns.   Total time  spent on today's visit was 25 minutes dedicated to this patient today, preparing to see patient, examining the patient, ordering tests and/or medications and counseling the patient, documenting clinical information in the EHR or other health record, independently interpreting results and communicating results to the patient/family, discussing treatment and goals, answering patient's questions and coordinating care.  Cc:  Jacky Kindle,  FNP  Sharene Butters 09/09/2022 7:26 AM

## 2022-09-09 NOTE — Patient Instructions (Signed)
It was a pleasure to see you today at our office.   Recommendations:  Follow up in 6  months Continue exelon 3 mg twice a day  Monitor blood pressure   Whom to call:  Memory  decline, memory medications: Call our office [phone]   For psychiatric meds, mood meds: Please have your primary care physician manage these medications.   Counseling regarding caregiver distress, including caregiver depression, anxiety and issues regarding community resources, adult day care programs, adult living facilities, or memory care questions:   Feel free to contact Misty Lisabeth Register, Social Worker at [phone]   For assessment of decision of mental capacity and competency:  Call Dr. Erick Blinks, geriatric psychiatrist at [phone]  For guidance in geriatric dementia issues please call [facility] [phone]  For guidance regarding [facility] and if placement were needed at the facility, contact Sidney Ace, Social Worker tel: [phone]  If you have any severe symptoms of a stroke, or other severe issues such as confusion,severe chills or fever, etc call 911 or go to the ER as you may need to be evaluated further   Feel free to visit Facebook page " [email or web address]" for tips of how to care for people with memory problems.      RECOMMENDATIONS FOR ALL PATIENTS WITH MEMORY PROBLEMS: 1. Continue to exercise (Recommend 30 minutes of walking everyday, or 3 hours every week) 2. Increase social interactions - continue going to [place] and enjoy social gatherings with friends and family 3. Eat healthy, avoid fried foods and eat more fruits and vegetables 4. Maintain adequate blood pressure, blood sugar, and blood cholesterol level. Reducing the risk of stroke and cardiovascular disease also helps promoting better memory. 5. Avoid stressful situations. Live a simple life and avoid aggravations. Organize your time and prepare for the next day in anticipation. 6.  Sleep well, avoid any interruptions of sleep and avoid any distractions in the bedroom that may interfere with adequate sleep quality 7. Avoid sugar, avoid sweets as there is a strong link between excessive sugar intake, diabetes, and cognitive impairment We discussed the Mediterranean diet, which has been shown to help patients reduce the risk of progressive memory disorders and reduces cardiovascular risk. This includes eating fish, eat fruits and green leafy vegetables, nuts like almonds and hazelnuts, walnuts, and also use olive oil. Avoid fast foods and fried foods as much as possible. Avoid sweets and sugar as sugar use has been linked to worsening of memory function.  There is always a concern of gradual progression of memory problems. If this is the case, then we may need to adjust level of care according to patient needs. Support, both to the patient and caregiver, should then be put into place.    FALL PRECAUTIONS: Be cautious when walking. Scan the area for obstacles that may increase the risk of trips and falls. When getting up in the mornings, sit up at the edge of the bed for a few minutes before getting out of bed. Consider elevating the bed at the head end to avoid drop of blood pressure when getting up. Walk always in a well-lit room (use night lights in the walls). Avoid area rugs or power cords from appliances in the middle of the walkways. Use a walker or a cane if necessary and consider physical therapy for balance exercise. Get your eyesight checked regularly.  FINANCIAL OVERSIGHT: Supervision, especially oversight when making financial decisions or transactions is also recommended.  HOME SAFETY: Consider  the safety of the kitchen when operating appliances like stoves, microwave oven, and blender. Consider having supervision and share cooking responsibilities until no longer able to participate in those. Accidents with firearms and other hazards in the house should be identified and  addressed as well.   ABILITY TO BE LEFT ALONE: If patient is unable to contact 911 operator, consider using LifeLine, or when the need is there, arrange for someone to stay with patients. Smoking is a fire hazard, consider supervision or cessation. Risk of wandering should be assessed by caregiver and if detected at any point, supervision and safe proof recommendations should be instituted.  MEDICATION SUPERVISION: Inability to self-administer medication needs to be constantly addressed. Implement a mechanism to ensure safe administration of the medications.   DRIVING: Regarding driving, in patients with progressive memory problems, driving will be impaired. We advise to have someone else do the driving if trouble finding directions or if minor accidents are reported. Independent driving assessment is available to determine safety of driving.   If you are interested in the driving assessment, you can contact the following:  [employer] in [place]  [place] [phone]  [place]  [facility] [phone] or [phone]

## 2022-09-09 NOTE — Progress Notes (Signed)
I,Connie R Striblin,acting as a Neurosurgeon for Jacky Kindle, FNP.,have documented all relevant documentation on the behalf of Jacky Kindle, FNP,as directed by  Jacky Kindle, FNP while in the presence of Jacky Kindle, FNP.   Established patient visit   Patient: Lauren Manning   DOB: 1935-10-01   86 y.o. Female  MRN: [id number] Visit Date: 09/09/2022  Today's healthcare provider: Jacky Kindle, FNP  Re Introduced to nurse practitioner role and practice setting.  All questions answered.  Discussed provider/patient relationship and expectations.  Subjective    HPI  Urinary symptoms  She reports recurrent hematuria. The current episode started a few days ago and is staying constant. Patient states symptoms are moderate in intensity, occurring constantly. She  has been recently treated for similar symptoms.    Associated symptoms: Yes abdominal pain No back pain  No chills No constipation  No cramping No diarrhea  No discharge No fever  Yes hematuria No nausea  No vomiting    ---------------------------------------------------------------------------------------   Medications: Outpatient Medications Prior to Visit  Medication Sig   amLODipine (NORVASC) 10 MG tablet Take 1 tablet (10 mg total) by mouth daily.   aspirin 81 MG tablet Take 81 mg by mouth daily.   FARXIGA 10 MG TABS tablet TAKE 1 TABLET BY MOUTH DAILY BEFORE BREAKFAST   hydrochlorothiazide (HYDRODIURIL) 25 MG tablet Take 1 tablet (25 mg total) by mouth daily.   lovastatin (MEVACOR) 40 MG tablet TAKE 1 TABLET BY MOUTH EVERY NIGHT AT BEDTIME (NEED OFFICE VISIT FOR FUTURE REFILLS)   metFORMIN (GLUCOPHAGE-XR) 750 MG 24 hr tablet Take 1 tablet (750 mg total) by mouth daily with breakfast.   metoprolol succinate (TOPROL-XL) 25 MG 24 hr tablet TAKE 1 TABLET BY MOUTH ONCE A DAY *MUST HAVE APPT FOR FURTHER REFILLS*   diclofenac (VOLTAREN) 50 MG EC tablet Take 1 tablet (50 mg total) by mouth 2 (two) times daily. (Patient not  taking: Reported on 09/09/2022)   nortriptyline (PAMELOR) 25 MG capsule Take 1 capsule (25 mg total) by mouth at bedtime.   potassium chloride (KLOR-CON) 10 MEQ tablet Take 1 tablet (10 mEq total) by mouth 2 (two) times daily for 3 days.   rivastigmine (EXELON) 3 MG capsule Take 1 capsule (3 mg total) by mouth 2 (two) times daily.   No facility-administered medications prior to visit.    Review of Systems    Objective    BP (!) 149/77   Pulse 90   Temp 97.7 F (36.5 C) (Oral)   Wt 122 lb 14.4 oz (55.7 kg)   SpO2 99%   BMI 22.48 kg/m   Physical Exam Vitals and nursing note reviewed.  Constitutional:      General: She is not in acute distress.    Appearance: Normal appearance. She is normal weight. She is not ill-appearing, toxic-appearing or diaphoretic.  HENT:     Head: Normocephalic and atraumatic.  Cardiovascular:     Rate and Rhythm: Normal rate and regular rhythm.     Pulses: Normal pulses.     Heart sounds: Normal heart sounds. No murmur heard.    No friction rub. No gallop.  Pulmonary:     Effort: Pulmonary effort is normal. No respiratory distress.     Breath sounds: Normal breath sounds. No stridor. No wheezing, rhonchi or rales.  Chest:     Chest wall: No tenderness.  Abdominal:     General: Bowel sounds are normal.  Palpations: Abdomen is soft.     Tenderness: There is no abdominal tenderness. There is no right CVA tenderness, left CVA tenderness or guarding.  Musculoskeletal:        General: No swelling, tenderness, deformity or signs of injury. Normal range of motion.     Right lower leg: No edema.     Left lower leg: No edema.  Skin:    General: Skin is warm and dry.     Capillary Refill: Capillary refill takes less than 2 seconds.     Coloration: Skin is not jaundiced or pale.     Findings: No bruising, erythema, lesion or rash.  Neurological:     General: No focal deficit present.     Mental Status: She is alert and oriented to person, place, and  time. Mental status is at baseline.     Cranial Nerves: No cranial nerve deficit.     Sensory: No sensory deficit.     Motor: No weakness.     Coordination: Coordination normal.  Psychiatric:        Mood and Affect: Mood normal.        Behavior: Behavior normal. Behavior is cooperative.        Thought Content: Thought content normal.        Cognition and Memory: Cognition is impaired. Memory is impaired. She exhibits impaired recent memory and impaired remote memory.        Judgment: Judgment normal.     Comments: Mild tremor R>L; chronic      Results for orders placed or performed in visit on 09/09/22  POCT Urinalysis Dipstick  Result Value Ref Range   Color, UA dark yellow    Clarity, UA cloudy    Glucose, UA Positive (A) Negative   Bilirubin, UA negative    Ketones, UA negative    Spec Grav, UA 1.015 1.010 - 1.025   Blood, UA moderate    pH, UA 6.0 5.0 - 8.0   Protein, UA Positive (A) Negative   Urobilinogen, UA negative (A) 0.2 or 1.0 E.U./dL   Nitrite, UA negative    Leukocytes, UA Small (1+) (A) Negative   Appearance     Odor      Assessment & Plan     Problem List Items Addressed This Visit       Cardiovascular and Mediastinum   Essential hypertension    Chronic, borderline elevation Goal <140/<90 Re-educated on diet assistance including water intake to assist medication to reach BP goal Continue Norvasc 10 mg, ASA 81 mg, Farxiga 10 mg, HCTZ 25 mg, Toprol 25 mg  Continue home checks to assist        Nervous and Auditory   Major neurocognitive disorder due to Alzheimer's disease, without behavioral disturbance    Chronic, stable Poor recall noted at neuro appt this morning; however, no medication changes as otherwise stable Continue exelon 3 mg to assist         Other   Dysuria - Primary    Acute, recurrent Previous UTI with hospital admission 06/2022 Will send for UCx      Relevant Orders   POCT Urinalysis Dipstick (Completed)   Urine Culture    Return if symptoms worsen or fail to improve.     Leilani Merl, FNP, have reviewed all documentation for this visit. The documentation on 09/09/22 for the exam, diagnosis, procedures, and orders are all accurate and complete.  Jacky Kindle, FNP  [facility] [phone] (phone)  [phone] (fax)  [place]

## 2022-09-09 NOTE — Assessment & Plan Note (Signed)
Chronic, stable Poor recall noted at neuro appt this morning; however, no medication changes as otherwise stable Continue exelon 3 mg to assist

## 2022-09-09 NOTE — Assessment & Plan Note (Signed)
Chronic, borderline elevation Goal <140/<90 Re-educated on diet assistance including water intake to assist medication to reach BP goal Continue Norvasc 10 mg, ASA 81 mg, Farxiga 10 mg, HCTZ 25 mg, Toprol 25 mg  Continue home checks to assist

## 2022-09-09 NOTE — Assessment & Plan Note (Signed)
Acute, recurrent Previous UTI with hospital admission 06/2022 Will send for UCx

## 2022-09-12 ENCOUNTER — Other Ambulatory Visit: Payer: Self-pay | Admitting: Family Medicine

## 2022-09-12 LAB — URINE CULTURE

## 2022-09-12 MED ORDER — LEVOFLOXACIN 750 MG PO TABS: 750.0000 mg | ORAL_TABLET | Freq: Every day | ORAL | 0 refills | Status: DC

## 2022-09-12 NOTE — Progress Notes (Signed)
New Rx called in to assist with UTI

## 2022-11-17 ENCOUNTER — Telehealth: Payer: Self-pay

## 2022-11-17 DIAGNOSIS — R4189 Other symptoms and signs involving cognitive functions and awareness: Secondary | ICD-10-CM

## 2022-11-17 DIAGNOSIS — G301 Alzheimer's disease with late onset: Secondary | ICD-10-CM

## 2022-11-17 DIAGNOSIS — F0280 Dementia in other diseases classified elsewhere without behavioral disturbance: Secondary | ICD-10-CM

## 2022-11-17 MED ORDER — RIVASTIGMINE TARTRATE 3 MG PO CAPS: 3.0000 mg | ORAL_CAPSULE | Freq: Two times a day (BID) | ORAL | 2 refills | Status: DC

## 2022-11-17 NOTE — Telephone Encounter (Signed)
Refill sent in for it

## 2022-11-17 NOTE — Telephone Encounter (Signed)
MEDICATION:rivastigmine (EXELON) 3 MG capsule   PHARMACY: [facility] [place] [facility] [place] (Ph: [phone])  Comments:   **Let patient know to contact pharmacy at the end of the day to make sure medication is ready. **  ** Please notify patient to allow 48-72 hours to process**  **Encourage patient to contact the pharmacy for refills or they can request refills through [facility]**

## 2022-12-12 ENCOUNTER — Other Ambulatory Visit: Payer: Self-pay | Admitting: Family Medicine

## 2022-12-12 DIAGNOSIS — M1990 Unspecified osteoarthritis, unspecified site: Secondary | ICD-10-CM

## 2022-12-15 NOTE — Telephone Encounter (Signed)
Unable to refill per protocol, Rx request is too soon. Last refill 01/10/22 for 30 days and 11 months.   Requested Prescriptions  Pending Prescriptions Disp Refills   metFORMIN (GLUCOPHAGE-XR) 750 MG 24 hr tablet [Pharmacy Med Name: [place] ER 750MG  ER TABLET ER 24HR] 30 tablet 11    Sig: TAKE ONE TABLET BY MOUTH DAILY WITH BREAKFAST     Endocrinology:  Diabetes - Biguanides Failed - 12/12/2022  3:05 PM      Failed - Cr in normal range and within 360 days    Creatinine  Date Value Ref Range Status  08/18/2013 0.97 0.60 - 1.30 mg/dL Final   Creatinine, Ser  Date Value Ref Range Status  07/13/2022 1.18 (H) 0.44 - 1.00 mg/dL Final         Failed - eGFR in normal range and within 360 days    EGFR (African American)  Date Value Ref Range Status  08/18/2013 >60  Final   GFR calc Af Amer  Date Value Ref Range Status  09/06/2019 47 (L) >59 mL/min/1.73 Final   EGFR (Non-African Amer.)  Date Value Ref Range Status  08/18/2013 57 (L)  Final    Comment:    eGFR values <2mL/min/1.73 m2 may be an indication of chronic kidney disease (CKD). Calculated eGFR is useful in patients with stable renal function. The eGFR calculation will not be reliable in acutely ill patients when serum creatinine is changing rapidly. It is not useful in  patients on dialysis. The eGFR calculation may not be applicable to patients at the low and high extremes of body sizes, pregnant women, and vegetarians.    GFR, Estimated  Date Value Ref Range Status  07/13/2022 46 (L) >60 mL/min Final    Comment:    (NOTE) Calculated using the CKD-EPI Creatinine Equation (2021)    eGFR  Date Value Ref Range Status  07/02/2022 33 (L) >59 mL/min/1.73 Final         Failed - B12 Level in normal range and within 720 days    No results found for: "VITAMINB12"       Passed - HBA1C is between 0 and 7.9 and within 180 days    Hemoglobin A1C  Date Value Ref Range Status  11/21/2014 5.9  Final   Hgb  A1c MFr Bld  Date Value Ref Range Status  07/02/2022 5.7 (H) 4.8 - 5.6 % Final    Comment:             Prediabetes: 5.7 - 6.4          Diabetes: >6.4          Glycemic control for adults with diabetes: <7.0          Passed - Valid encounter within last 6 months    Recent Outpatient Visits           3 months ago [place] Tally Joe T, FNP   5 months ago Late onset Alzheimer's disease without behavioral disturbance [facility])   [place] [name], FNP   11 months ago Essential (primary) hypertension   [place] Tally Joe T, FNP   1 year ago Late onset Alzheimer's disease without behavioral disturbance [facility])   [place], PA-C   2 years ago Right leg pain   [place], [place], [place]  Passed - CBC within normal limits and completed in the last 12 months    WBC  Date Value Ref Range Status  07/13/2022 7.9 4.0 - 10.5 K/uL Final   RBC  Date Value Ref Range Status  07/13/2022 5.71 (H) 3.87 - 5.11 MIL/uL Final   Hemoglobin  Date Value Ref Range Status  07/13/2022 17.0 (H) 12.0 - 15.0 g/dL Final  12/27/2021 15.7 11.1 - 15.9 g/dL Final   HCT  Date Value Ref Range Status  07/13/2022 51.3 (H) 36.0 - 46.0 % Final   Hematocrit  Date Value Ref Range Status  12/27/2021 46.4 34.0 - 46.6 % Final   MCHC  Date Value Ref Range Status  07/13/2022 33.1 30.0 - 36.0 g/dL Final   [facility]  Date Value Ref Range Status  07/13/2022 29.8 26.0 - 34.0 pg Final   MCV  Date Value Ref Range Status  07/13/2022 89.8 80.0 - 100.0 fL Final  12/27/2021 91 79 - 97 fL Final  08/18/2013 89 80 - 100 fL Final   No results found for: "PLTCOUNTKUC", "LABPLAT", "POCPLA" RDW  Date Value Ref Range Status  07/13/2022 13.7 11.5 - 15.5 % Final  12/27/2021 12.2 11.7 - 15.4 % Final  08/18/2013 13.6 11.5 -  14.5 % Final          diclofenac (VOLTAREN) 50 MG EC tablet [Pharmacy Med Name: DICLOFENAC SODIUM DR 50MG  DR TABLET DR] 60 tablet 11    Sig: TAKE ONE TABLET BY MOUTH TWO TIMES DAILY AS NEEDED     Analgesics:  NSAIDS Failed - 12/12/2022  3:05 PM      Failed - Manual Review: Labs are only required if the patient has taken medication for more than 8 weeks.      Failed - Cr in normal range and within 360 days    Creatinine  Date Value Ref Range Status  08/18/2013 0.97 0.60 - 1.30 mg/dL Final   Creatinine, Ser  Date Value Ref Range Status  07/13/2022 1.18 (H) 0.44 - 1.00 mg/dL Final         Failed - HGB in normal range and within 360 days    Hemoglobin  Date Value Ref Range Status  07/13/2022 17.0 (H) 12.0 - 15.0 g/dL Final  12/27/2021 15.7 11.1 - 15.9 g/dL Final         Failed - HCT in normal range and within 360 days    HCT  Date Value Ref Range Status  07/13/2022 51.3 (H) 36.0 - 46.0 % Final   Hematocrit  Date Value Ref Range Status  12/27/2021 46.4 34.0 - 46.6 % Final         Passed - PLT in normal range and within 360 days    Platelets  Date Value Ref Range Status  07/13/2022 316 150 - 400 K/uL Final  12/27/2021 270 150 - 450 x10E3/uL Final         Passed - eGFR is 30 or above and within 360 days    EGFR (African American)  Date Value Ref Range Status  08/18/2013 >60  Final   GFR calc Af Amer  Date Value Ref Range Status  09/06/2019 47 (L) >59 mL/min/1.73 Final   EGFR (Non-African Amer.)  Date Value Ref Range Status  08/18/2013 57 (L)  Final    Comment:    eGFR values <24mL/min/1.73 m2 may be an indication of chronic kidney disease (CKD). Calculated eGFR is useful in patients with stable renal function. The eGFR calculation will not be  reliable in acutely ill patients when serum creatinine is changing rapidly. It is not useful in  patients on dialysis. The eGFR calculation may not be applicable to patients at the low and high extremes of body sizes,  pregnant women, and vegetarians.    GFR, Estimated  Date Value Ref Range Status  07/13/2022 46 (L) >60 mL/min Final    Comment:    (NOTE) Calculated using the CKD-EPI Creatinine Equation (2021)    eGFR  Date Value Ref Range Status  07/02/2022 33 (L) >59 mL/min/1.73 Final         Passed - Patient is not pregnant      Passed - Valid encounter within last 12 months    Recent Outpatient Visits           3 months ago [place] Tally Joe T, FNP   5 months ago Late onset Alzheimer's disease without behavioral disturbance [facility])   [place] [name], FNP   11 months ago Essential (primary) hypertension   [place] Tally Joe T, FNP   1 year ago Late onset Alzheimer's disease without behavioral disturbance [facility])   [place], PA-C   2 years ago Right leg pain   [place], [place], [place]

## 2022-12-29 ENCOUNTER — Telehealth: Payer: Self-pay | Admitting: Family Medicine

## 2022-12-29 NOTE — Telephone Encounter (Signed)
Contacted Lauren Manning to schedule their annual wellness visit. Appointment made for 01/06/2023.  [facility] Care Guide [facility] AWV TEAM Direct Dial: [phone]

## 2023-01-06 ENCOUNTER — Ambulatory Visit (INDEPENDENT_AMBULATORY_CARE_PROVIDER_SITE_OTHER): Payer: Medicare Other

## 2023-01-06 VITALS — Ht 62.0 in | Wt 122.0 lb

## 2023-01-06 DIAGNOSIS — Z0000 Encounter for general adult medical examination without abnormal findings: Secondary | ICD-10-CM | POA: Diagnosis not present

## 2023-01-06 NOTE — Progress Notes (Signed)
Subjective:   Lauren Manning is a 87 y.o. female who presents for Medicare Annual (Subsequent) preventive examination.    I connected with  [name] on 01/06/23 by a audio enabled telemedicine application and verified that I am speaking with the correct person using two identifiers.  Patient Location: Home  Provider Location: Home Office  I discussed the limitations of evaluation and management by telemedicine. The patient expressed understanding and agreed to proceed.       Review of Systems     Cardiac Risk Factors include: advanced age (>53men, >41 women);diabetes mellitus;hypertension;dyslipidemia     Objective:    Today's Vitals   01/06/23 1503  Weight: 122 lb (55.3 kg)  Height:  (1.575 m)   Body mass index is 22.31 kg/m.     01/06/2023    3:11 PM 07/13/2022    1:44 PM 01/23/2022    1:51 PM 07/31/2021    3:33 PM 01/22/2021    3:52 PM 06/05/2020    4:04 PM 11/23/2019    9:13 AM  Advanced Directives  Does Patient Have a Medical Advance Directive? No No No No No No Yes  Type of Advance Directive       Healthcare Power of Attorney  Would patient like information on creating a medical advance directive? No - Patient declined  No - Patient declined        Current Medications (verified) Outpatient Encounter Medications as of 01/06/2023  Medication Sig   amLODipine (NORVASC) 10 MG tablet Take 1 tablet (10 mg total) by mouth daily.   aspirin 81 MG tablet Take 81 mg by mouth daily.   diclofenac (VOLTAREN) 50 MG EC tablet Take 1 tablet (50 mg total) by mouth 2 (two) times daily.   FARXIGA 10 MG TABS tablet TAKE 1 TABLET BY MOUTH DAILY BEFORE BREAKFAST   hydrochlorothiazide (HYDRODIURIL) 25 MG tablet Take 1 tablet (25 mg total) by mouth daily.   lovastatin (MEVACOR) 40 MG tablet TAKE 1 TABLET BY MOUTH EVERY NIGHT AT BEDTIME (NEED OFFICE VISIT FOR FUTURE REFILLS)   metFORMIN (GLUCOPHAGE-XR) 750 MG 24 hr tablet Take 1 tablet (750 mg total) by mouth daily with  breakfast.   metoprolol succinate (TOPROL-XL) 25 MG 24 hr tablet TAKE 1 TABLET BY MOUTH ONCE A DAY *MUST HAVE APPT FOR FURTHER REFILLS*   nortriptyline (PAMELOR) 25 MG capsule Take 1 capsule (25 mg total) by mouth at bedtime.   rivastigmine (EXELON) 3 MG capsule Take 1 capsule (3 mg total) by mouth 2 (two) times daily.   potassium chloride (KLOR-CON) 10 MEQ tablet Take 1 tablet (10 mEq total) by mouth 2 (two) times daily for 3 days.   [DISCONTINUED] levofloxacin (LEVAQUIN) 750 MG tablet Take 1 tablet (750 mg total) by mouth daily.   [DISCONTINUED] sulfamethoxazole-trimethoprim (BACTRIM DS) 800-160 MG tablet Take 1 tablet by mouth 2 (two) times daily.   No facility-administered encounter medications on file as of 01/06/2023.    Allergies (verified) Enalapril and Penicillins   History: Past Medical History:  Diagnosis Date   Arteriosclerotic cardiovascular disease 11/21/2015   Arthritis    Gout   Arthritis urica 11/21/2015   Baden-Walker grade 3 cystocele 05/19/2016   Cataract 11/21/2015   Cervical prolapse 11/21/2015   DD (diverticular disease) 11/21/2015   Essential (primary) hypertension 11/21/2015   GERD (gastroesophageal reflux disease)    Hereditary hemochromatosis 06/09/2016   History of colonic polyps    HOH (hard of hearing)    Hypercholesteremia 11/21/2015  Hyperglycemia    Hyperlipidemia    Insomnia 11/21/2015   Low back pain 11/21/2015   Major neurocognitive disorder due to Alzheimer's disease, without behavioral disturbance 11/21/2015   Multiple contusions of trunk    Neck pain 12/12/2014   Osteopenia    Past Surgical History:  Procedure Laterality Date   ABDOMINAL HYSTERECTOMY     CATARACT EXTRACTION W/PHACO Left 07/22/2016   Procedure: CATARACT EXTRACTION PHACO AND INTRAOCULAR LENS PLACEMENT (IOC);  Surgeon: Galen Manila, MD;  Location: [facility] ORS;  Service: Ophthalmology;  Laterality: Left;  [place] 43.1 AP% 20.2 CDE 8.71 Fluid pack lot # [id number]   CHOLECYSTECTOMY  04/16/2004    Dr. Orlene Erm. Anterior and posterior colporrhaphy   COLONOSCOPY WITH PROPOFOL N/A 07/01/2016   Procedure: COLONOSCOPY WITH PROPOFOL;  Surgeon: Midge Minium, MD;  Location: [facility];  Service: Endoscopy;  Laterality: N/A;   history of colon polyps  1999   PARTIAL HYSTERECTOMY     TONSILLECTOMY     TUBAL LIGATION     Family History  Problem Relation Age of Onset   Dementia Mother    Prostate cancer Father    Breast cancer Daughter 63       breast cancer   Social History   Socioeconomic History   Marital status: Single    Spouse name: Not on file   Number of children: Not on file   Years of education: 12   Highest education level: High school graduate  Occupational History   Occupation: Retired  Tobacco Use   Smoking status: Never   Smokeless tobacco: Never  Vaping Use   Vaping Use: Never used  Substance and Sexual Activity   Alcohol use: No   Drug use: No   Sexual activity: Not Currently    Birth control/protection: None  Other Topics Concern   Not on file  Social History Narrative   Right handed    Lives alone   One story home no steps    Caffeine prn   retired   International aid/development worker of Corporate investment banker Strain: Low Risk  (01/06/2023)   Overall Financial Resource Strain (CARDIA)    Difficulty of Paying Living Expenses: Not hard at all  Food Insecurity: No Food Insecurity (01/06/2023)   Hunger Vital Sign    Worried About Running Out of Food in the Last Year: Never true    Ran Out of Food in the Last Year: Never true  Transportation Needs: No Transportation Needs (01/06/2023)   PRAPARE - Administrator, Civil Service (Medical): No    Lack of Transportation (Non-Medical): No  Physical Activity: Insufficiently Active (01/06/2023)   Exercise Vital Sign    Days of Exercise per Week: 1 day    Minutes of Exercise per Session: 10 min  Stress: No Stress Concern Present (01/06/2023)   [employer] of Occupational Health - Occupational Stress  Questionnaire    Feeling of Stress : Not at all  Social Connections: Socially Integrated (01/06/2023)   Social Connection and Isolation Panel [NHANES]    Frequency of Communication with Friends and Family: More than three times a week    Frequency of Social Gatherings with Friends and Family: More than three times a week    Attends Religious Services: More than 4 times per year    Active Member of [employer] or Organizations: Yes    Attends Engineer, structural: More than 4 times per year    Marital Status: Married    Tobacco Counseling Counseling  given: Not Answered   Clinical Intake:  Pre-visit preparation completed: Yes  Pain : No/denies pain     Nutritional Status: BMI of 19-24  Normal Nutritional Risks: None Diabetes: No  How often do you need to have someone help you when you read instructions, pamphlets, or other written materials from your doctor or pharmacy?: 1 - Never  Diabetic? Yes  Interpreter Needed?: No  Information entered by :: Kandis Cocking, CMA   Activities of Daily Living    01/06/2023    3:11 PM 01/23/2022    1:52 PM  In your present state of health, do you have any difficulty performing the following activities:  Hearing? 0 1  Vision? 0 0  Difficulty concentrating or making decisions? 0 1  Walking or climbing stairs? 0 1  Dressing or bathing? 0 0  Doing errands, shopping? 0 1  Preparing Food and eating ? N N  Using the Toilet? N N  In the past six months, have you accidently leaked urine? N N  Do you have problems with loss of bowel control? N N  Managing your Medications? N N  Managing your Finances? N N  Housekeeping or managing your Housekeeping? N N    Patient Care Team: Jacky Kindle, FNP as PCP - General (Family Medicine) Hildred Laser, MD as Referring Physician (Obstetrics and Gynecology) Koleen Distance, PsyD as Consulting Physician (Psychology) Karel Jarvis Lesle Chris, MD as Consulting Physician (Neurology)  Indicate any  recent Medical Services you may have received from other than [facility] providers in the past year (date may be approximate).     Assessment:   This is a routine wellness examination for Alyssia.  Hearing/Vision screen Hearing Screening - Comments:: Patient has bilateral hearing aids, but does not wear them Vision Screening - Comments:: Wears rx glasses - up to date with routine eye exams  Dietary issues and exercise activities discussed: Current Exercise Habits: Home exercise routine, Type of exercise: Other - see comments, Exercise limited by: Other - see comments   Goals Addressed             This Visit's Progress    DIET - EAT MORE FRUITS AND VEGETABLES   On track      Depression Screen    01/06/2023    3:17 PM 01/23/2022    1:49 PM 12/27/2021    1:18 PM 03/11/2021    3:53 PM 09/07/2019    3:09 PM 03/19/2017    2:25 PM 03/19/2017    2:21 PM  PHQ 2/9 Scores  PHQ - 2 Score 1 1 1  0 0 0 0  PHQ- 9 Score  3 4 1  2      Fall Risk    01/06/2023    3:05 PM 07/22/2022   11:19 AM 01/23/2022    1:52 PM 12/27/2021    1:18 PM 12/04/2021    4:10 PM  Fall Risk   Falls in the past year? 1 0 1 1 0  Number falls in past yr: 0 0 0 0 0  Injury with Fall? 0 0 0 0 0  Comment per patient she fell out of the bed      Risk for fall due to :  Impaired balance/gait History of fall(s)    Follow up  Falls evaluation completed Falls evaluation completed      FALL RISK PREVENTION PERTAINING TO THE HOME:  Any stairs in or around the home? No  If so, are there any without handrails? No  Home  free of loose throw rugs in walkways, pet beds, electrical cords, etc? No  Adequate lighting in your home to reduce risk of falls? Yes   ASSISTIVE DEVICES UTILIZED TO PREVENT FALLS:  Life alert? Yes  Use of a cane, walker or w/c? Yes  Grab bars in the bathroom? No  Shower chair or bench in shower? No  Elevated toilet seat or a handicapped toilet? No   TIMED UP AND GO:  Was the test performed? No .  televisit  Cognitive Function:    09/09/2022   12:00 PM 09/10/2017    1:37 PM 09/09/2017    4:00 PM  MMSE - Mini Mental State Exam  Orientation to time 1 2 2   Orientation to Place 3 3 3   Registration 3 2 2   Attention/ Calculation 4 5 5   Recall 0 1 1  Language- name 2 objects 2 2 2   Language- repeat 1 1 1   Language- follow 3 step command 3 3 3   Language- read & follow direction 1 1 1   Write a sentence 1 1 1   Copy design 0 1 1  Total score 19 22 22         01/06/2023    3:15 PM 03/19/2017    2:26 PM 08/27/2016    9:03 AM  6CIT Screen  What Year? 4 points 0 points 0 points  What month? 0 points 0 points 0 points  What time? 0 points 0 points 3 points  Count back from 20 0 points 0 points 0 points  Months in reverse 0 points 2 points 2 points  Repeat phrase 10 points 6 points 8 points  Total Score 14 points 8 points 13 points    Immunizations Immunization History  Administered Date(s) Administered   Fluad Quad(high Dose 65+) 07/02/2022   Influenza, High Dose Seasonal PF 07/28/2017, 06/19/2018   Influenza-Unspecified 06/22/2014   Td 12/25/2005    TDAP status: Due, Education has been provided regarding the importance of this vaccine. Advised may receive this vaccine at local pharmacy or Health Dept. Aware to provide a copy of the vaccination record if obtained from local pharmacy or Health Dept. Verbalized acceptance and understanding.  Flu Vaccine status: Up to date  Pneumococcal vaccine status: Due, Education has been provided regarding the importance of this vaccine. Advised may receive this vaccine at local pharmacy or Health Dept. Aware to provide a copy of the vaccination record if obtained from local pharmacy or Health Dept. Verbalized acceptance and understanding.  Covid-19 vaccine status: Information provided on how to obtain vaccines.   Qualifies for Shingles Vaccine? Yes   Zostavax completed No   Shingrix Completed?: No.    Education has been provided  regarding the importance of this vaccine. Patient has been advised to call insurance company to determine out of pocket expense if they have not yet received this vaccine. Advised may also receive vaccine at local pharmacy or Health Dept. Verbalized acceptance and understanding.  Screening Tests Health Maintenance  Topic Date Due   COVID-19 Vaccine (1) Never done   Zoster Vaccines- Shingrix (1 of 2) Never done   Pneumonia Vaccine 33+ Years old (1 of 1 - PCV) Never done   DTaP/Tdap/Td (2 - Tdap) 12/26/2015   COLONOSCOPY (Pts 45-59yrs Insurance coverage will need to be confirmed)  07/01/2021   INFLUENZA VACCINE  04/23/2023   Medicare Annual Wellness (AWV)  01/06/2024   DEXA SCAN  Completed   HPV VACCINES  Aged Out    Health Maintenance  Health  Maintenance Due  Topic Date Due   COVID-19 Vaccine (1) Never done   Zoster Vaccines- Shingrix (1 of 2) Never done   Pneumonia Vaccine 48+ Years old (1 of 1 - PCV) Never done   DTaP/Tdap/Td (2 - Tdap) 12/26/2015   COLONOSCOPY (Pts 45-63yrs Insurance coverage will need to be confirmed)  07/01/2021    Colorectal cancer screening: Type of screening: Colonoscopy. Completed 07/01/2016. Repeat every 5 years  Mammogram status: No longer required due to age. Bone Density status: Completed 12/07/2014. Results reflect: Bone density results: NORMAL. Repeat every 2 years.  Lung Cancer Screening: (Low Dose CT Chest recommended if Age 43-80 years, 30 pack-year currently smoking OR have quit w/in 15years.) does not qualify.   Lung Cancer Screening Referral: N/A  Additional Screening:  Hepatitis C Screening: does not qualify; Completed No  Vision Screening: Recommended annual ophthalmology exams for early detection of glaucoma and other disorders of the eye. Is the patient up to date with their annual eye exam?  Yes  Who is the provider or what is the name of the office in which the patient attends annual eye exams?   Patient could not remember  location If pt is not established with a provider, would they like to be referred to a provider to establish care? No .   Dental Screening: Recommended annual dental exams for proper oral hygiene  Community Resource Referral / Chronic Care Management: CRR required this visit?  No   CCM required this visit?  No      Plan:     I have personally reviewed and noted the following in the patient's chart:   Medical and social history Use of alcohol, tobacco or illicit drugs  Current medications and supplements including opioid prescriptions. Patient is not currently taking opioid prescriptions. Functional ability and status Nutritional status Physical activity Advanced directives List of other physicians Hospitalizations, surgeries, and ER visits in previous 12 months Vitals Screenings to include cognitive, depression, and falls Referrals and appointments  In addition, I have reviewed and discussed with patient certain preventive protocols, quality metrics, and best practice recommendations. A written personalized care plan for preventive services as well as general preventive health recommendations were provided to patient.     Milus Mallick, CMA   01/06/2023   Nurse Notes: routine follow up appt schedule to see provider on 06/08/23, will obtain needed vaccines on that day.

## 2023-01-06 NOTE — Patient Instructions (Signed)
Lauren Manning , Thank you for taking time to come for your Medicare Wellness Visit. I appreciate your ongoing commitment to your health goals. Please review the following plan we discussed and let me know if I can assist you in the future.   These are the goals we discussed:  Goals      DIET - EAT MORE FRUITS AND VEGETABLES     Exercise 2x per week      Recommend to start exercising. Pt agrees to take exercise class at living facility 2 days a week for 1 hour.         This is a list of the screening recommended for you and due dates:  Health Maintenance  Topic Date Due   COVID-19 Vaccine (1) Never done   Zoster (Shingles) Vaccine (1 of 2) Never done   Pneumonia Vaccine (1 of 1 - PCV) Never done   DTaP/Tdap/Td vaccine (2 - Tdap) 12/26/2015   Colon Cancer Screening  07/01/2021   Flu Shot  04/23/2023   Medicare Annual Wellness Visit  01/06/2024   DEXA scan (bone density measurement)  Completed   HPV Vaccine  Aged Out    Advanced directives: Will obtain copy from office  Conditions/risks identified: Keep up the good work  Next appointment: Follow up in one year for your annual wellness visit-01/11/24   Preventive Care 65 Years and Older, Female Preventive care refers to lifestyle choices and visits with your health care provider that can promote health and wellness. What does preventive care include? A yearly physical exam. This is also called an annual well check. Dental exams once or twice a year. Routine eye exams. Ask your health care provider how often you should have your eyes checked. Personal lifestyle choices, including: Daily care of your teeth and gums. Regular physical activity. Eating a healthy diet. Avoiding tobacco and drug use. Limiting alcohol use. Practicing safe sex. Taking low-dose aspirin every day. Taking vitamin and mineral supplements as recommended by your health care provider. What happens during an annual well check? The services and screenings done  by your health care provider during your annual well check will depend on your age, overall health, lifestyle risk factors, and family history of disease. Counseling  Your health care provider may ask you questions about your: Alcohol use. Tobacco use. Drug use. Emotional well-being. Home and relationship well-being. Sexual activity. Eating habits. History of falls. Memory and ability to understand (cognition). Work and work Astronomer. Reproductive health. Screening  You may have the following tests or measurements: Height, weight, and BMI. Blood pressure. Lipid and cholesterol levels. These may be checked every 5 years, or more frequently if you are over 66 years old. Skin check. Lung cancer screening. You may have this screening every year starting at age 9 if you have a 30-pack-year history of smoking and currently smoke or have quit within the past 15 years. Fecal occult blood test (FOBT) of the stool. You may have this test every year starting at age 60. Flexible sigmoidoscopy or colonoscopy. You may have a sigmoidoscopy every 5 years or a colonoscopy every 10 years starting at age 4. Hepatitis C blood test. Hepatitis B blood test. Sexually transmitted disease (STD) testing. Diabetes screening. This is done by checking your blood sugar (glucose) after you have not eaten for a while (fasting). You may have this done every 1-3 years. Bone density scan. This is done to screen for osteoporosis. You may have this done starting at age 66. Mammogram. This  may be done every 1-2 years. Talk to your health care provider about how often you should have regular mammograms. Talk with your health care provider about your test results, treatment options, and if necessary, the need for more tests. Vaccines  Your health care provider may recommend certain vaccines, such as: Influenza vaccine. This is recommended every year. Tetanus, diphtheria, and acellular pertussis (Tdap, Td) vaccine. You  may need a Td booster every 10 years. Zoster vaccine. You may need this after age 17. Pneumococcal 13-valent conjugate (PCV13) vaccine. One dose is recommended after age 42. Pneumococcal polysaccharide (PPSV23) vaccine. One dose is recommended after age 54. Talk to your health care provider about which screenings and vaccines you need and how often you need them. This information is not intended to replace advice given to you by your health care provider. Make sure you discuss any questions you have with your health care provider. Document Released: 10/05/2015 Document Revised: 05/28/2016 Document Reviewed: 07/10/2015 Elsevier Interactive Patient Education  2017 [employer].  Fall Prevention in the Home Falls can cause injuries. They can happen to people of all ages. There are many things you can do to make your home safe and to help prevent falls. What can I do on the outside of my home? Regularly fix the edges of walkways and driveways and fix any cracks. Remove anything that might make you trip as you walk through a door, such as a raised step or threshold. Trim any bushes or trees on the path to your home. Use bright outdoor lighting. Clear any walking paths of anything that might make someone trip, such as rocks or tools. Regularly check to see if handrails are loose or broken. Make sure that both sides of any steps have handrails. Any raised decks and porches should have guardrails on the edges. Have any leaves, snow, or ice cleared regularly. Use sand or salt on walking paths during winter. Clean up any spills in your garage right away. This includes oil or grease spills. What can I do in the bathroom? Use night lights. Install grab bars by the toilet and in the tub and shower. Do not use towel bars as grab bars. Use non-skid mats or decals in the tub or shower. If you need to sit down in the shower, use a plastic, non-slip stool. Keep the floor dry. Clean up any water that spills  on the floor as soon as it happens. Remove soap buildup in the tub or shower regularly. Attach bath mats securely with double-sided non-slip rug tape. Do not have throw rugs and other things on the floor that can make you trip. What can I do in the bedroom? Use night lights. Make sure that you have a light by your bed that is easy to reach. Do not use any sheets or blankets that are too big for your bed. They should not hang down onto the floor. Have a firm chair that has side arms. You can use this for support while you get dressed. Do not have throw rugs and other things on the floor that can make you trip. What can I do in the kitchen? Clean up any spills right away. Avoid walking on wet floors. Keep items that you use a lot in easy-to-reach places. If you need to reach something above you, use a strong step stool that has a grab bar. Keep electrical cords out of the way. Do not use floor polish or wax that makes floors slippery. If you must  use wax, use non-skid floor wax. Do not have throw rugs and other things on the floor that can make you trip. What can I do with my stairs? Do not leave any items on the stairs. Make sure that there are handrails on both sides of the stairs and use them. Fix handrails that are broken or loose. Make sure that handrails are as long as the stairways. Check any carpeting to make sure that it is firmly attached to the stairs. Fix any carpet that is loose or worn. Avoid having throw rugs at the top or bottom of the stairs. If you do have throw rugs, attach them to the floor with carpet tape. Make sure that you have a light switch at the top of the stairs and the bottom of the stairs. If you do not have them, ask someone to add them for you. What else can I do to help prevent falls? Wear shoes that: Do not have high heels. Have rubber bottoms. Are comfortable and fit you well. Are closed at the toe. Do not wear sandals. If you use a stepladder: Make  sure that it is fully opened. Do not climb a closed stepladder. Make sure that both sides of the stepladder are locked into place. Ask someone to hold it for you, if possible. Clearly mark and make sure that you can see: Any grab bars or handrails. First and last steps. Where the edge of each step is. Use tools that help you move around (mobility aids) if they are needed. These include: Canes. Walkers. Scooters. Crutches. Turn on the lights when you go into a dark area. Replace any light bulbs as soon as they burn out. Set up your furniture so you have a clear path. Avoid moving your furniture around. If any of your floors are uneven, fix them. If there are any pets around you, be aware of where they are. Review your medicines with your doctor. Some medicines can make you feel dizzy. This can increase your chance of falling. Ask your doctor what other things that you can do to help prevent falls. This information is not intended to replace advice given to you by your health care provider. Make sure you discuss any questions you have with your health care provider. Document Released: 07/05/2009 Document Revised: 02/14/2016 Document Reviewed: 10/13/2014 Elsevier Interactive Patient Education  2017 [employer].

## 2023-01-13 ENCOUNTER — Other Ambulatory Visit: Payer: Self-pay | Admitting: Family Medicine

## 2023-01-13 DIAGNOSIS — M1990 Unspecified osteoarthritis, unspecified site: Secondary | ICD-10-CM

## 2023-01-13 DIAGNOSIS — E7800 Pure hypercholesterolemia, unspecified: Secondary | ICD-10-CM

## 2023-01-13 NOTE — Telephone Encounter (Signed)
Lovastatin -02/26/22 #100 3RF- too soon Requested Prescriptions  Pending Prescriptions Disp Refills   diclofenac (VOLTAREN) 50 MG EC tablet [Pharmacy Med Name: DICLOFENAC SODIUM DR 50MG  DR TABLET DR] 60 tablet 11    Sig: TAKE ONE TABLET BY MOUTH TWO TIMES DAILY AS NEEDED     Analgesics:  NSAIDS Failed - 01/13/2023  3:46 PM      Failed - Manual Review: Labs are only required if the patient has taken medication for more than 8 weeks.      Failed - Cr in normal range and within 360 days    Creatinine  Date Value Ref Range Status  08/18/2013 0.97 0.60 - 1.30 mg/dL Final   Creatinine, Ser  Date Value Ref Range Status  07/13/2022 1.18 (H) 0.44 - 1.00 mg/dL Final         Failed - HGB in normal range and within 360 days    Hemoglobin  Date Value Ref Range Status  07/13/2022 17.0 (H) 12.0 - 15.0 g/dL Final  96/12/5407 81.1 11.1 - 15.9 g/dL Final         Failed - HCT in normal range and within 360 days    HCT  Date Value Ref Range Status  07/13/2022 51.3 (H) 36.0 - 46.0 % Final   Hematocrit  Date Value Ref Range Status  12/27/2021 46.4 34.0 - 46.6 % Final         Passed - PLT in normal range and within 360 days    Platelets  Date Value Ref Range Status  07/13/2022 316 150 - 400 K/uL Final  12/27/2021 270 150 - 450 x10E3/uL Final         Passed - eGFR is 30 or above and within 360 days    EGFR (African American)  Date Value Ref Range Status  08/18/2013 >60  Final   GFR calc Af Amer  Date Value Ref Range Status  09/06/2019 47 (L) >59 mL/min/1.73 Final   EGFR (Non-African Amer.)  Date Value Ref Range Status  08/18/2013 57 (L)  Final    Comment:    eGFR values <42mL/min/1.73 m2 may be an indication of chronic kidney disease (CKD). Calculated eGFR is useful in patients with stable renal function. The eGFR calculation will not be reliable in acutely ill patients when serum creatinine is changing rapidly. It is not useful in  patients on dialysis. The eGFR calculation may  not be applicable to patients at the low and high extremes of body sizes, pregnant women, and vegetarians.    GFR, Estimated  Date Value Ref Range Status  07/13/2022 46 (L) >60 mL/min Final    Comment:    (NOTE) Calculated using the CKD-EPI Creatinine Equation (2021)    eGFR  Date Value Ref Range Status  07/02/2022 33 (L) >59 mL/min/1.73 Final         Passed - Patient is not pregnant      Passed - Valid encounter within last 12 months    Recent Outpatient Visits           4 months ago Dysuria   [facility] Merita Norton T, FNP   6 months ago Late onset Alzheimer's disease without behavioral disturbance [facility])   [facility] Merita Norton T, FNP   1 year ago Essential (primary) hypertension   [place] [facility] Merita Norton T, FNP   1 year ago Late onset Alzheimer's disease without behavioral disturbance [facility])   [facility] Chrismon, Jodell Cipro, PA-C   2 years ago Right leg pain   [place] [facility] [place], Alessandra [place], [place]       Future Appointments             In 4 months Suzie Portela, Daryl Eastern, FNP [facility]             metFORMIN (GLUCOPHAGE-XR) 750 MG 24 hr tablet [Pharmacy Med Name: METFORMIN HYDROCHLORIDE ER 750MG  ER TABLET ER 24HR] 30 tablet 11    Sig: TAKE ONE TABLET BY MOUTH DAILY WITH BREAKFAST     Endocrinology:  Diabetes - Biguanides Failed - 01/13/2023  3:46 PM      Failed - Cr in normal range and within 360 days    Creatinine  Date Value Ref Range Status  08/18/2013 0.97 0.60 - 1.30 mg/dL Final   Creatinine, Ser  Date Value Ref Range Status  07/13/2022 1.18 (H) 0.44 - 1.00 mg/dL Final         Failed - HBA1C is between 0 and 7.9 and within 180 days    Hemoglobin A1C  Date Value Ref Range Status  11/21/2014 5.9  Final   Hgb A1c MFr Bld  Date Value Ref Range Status  07/02/2022 5.7 (H) 4.8 - 5.6 %  Final    Comment:             Prediabetes: 5.7 - 6.4          Diabetes: >6.4          Glycemic control for adults with diabetes: <7.0          Failed - eGFR in normal range and within 360 days    EGFR (African American)  Date Value Ref Range Status  08/18/2013 >60  Final   GFR calc Af Amer  Date Value Ref Range Status  09/06/2019 47 (L) >59 mL/min/1.73 Final   EGFR (Non-African Amer.)  Date Value Ref Range Status  08/18/2013 57 (L)  Final    Comment:    eGFR values <63mL/min/1.73 m2 may be an indication of chronic kidney disease (CKD). Calculated eGFR is useful in patients with stable renal function. The eGFR calculation will not be reliable in acutely ill patients when serum creatinine is changing rapidly. It is not useful in  patients on dialysis. The eGFR calculation may not be applicable to patients at the low and high extremes of body sizes, pregnant women, and vegetarians.    GFR, Estimated  Date Value Ref Range Status  07/13/2022 46 (L) >60 mL/min Final    Comment:    (NOTE) Calculated using the CKD-EPI Creatinine Equation (2021)    eGFR  Date Value Ref Range Status  07/02/2022 33 (L) >59 mL/min/1.73 Final         Failed - B12 Level in normal range and within 720 days    No results found for: "VITAMINB12"       Failed - CBC within normal limits and completed in the last 12 months    WBC  Date Value Ref Range Status  07/13/2022 7.9 4.0 - 10.5 K/uL Final   RBC  Date Value Ref Range Status  07/13/2022 5.71 (H) 3.87 - 5.11 MIL/uL Final   Hemoglobin  Date Value Ref Range Status  07/13/2022 17.0 (H) 12.0 - 15.0 g/dL Final  16/06/9603 54.0 11.1 - 15.9 g/dL Final   HCT  Date Value Ref Range Status  07/13/2022 51.3 (H) 36.0 - 46.0 %  Final   Hematocrit  Date Value Ref Range Status  12/27/2021 46.4 34.0 - 46.6 % Final   MCHC  Date Value Ref Range Status  07/13/2022 33.1 30.0 - 36.0 g/dL Final   [facility]  Date Value Ref Range Status  07/13/2022 29.8  26.0 - 34.0 pg Final   MCV  Date Value Ref Range Status  07/13/2022 89.8 80.0 - 100.0 fL Final  12/27/2021 91 79 - 97 fL Final  08/18/2013 89 80 - 100 fL Final   No results found for: "PLTCOUNTKUC", "LABPLAT", "POCPLA" RDW  Date Value Ref Range Status  07/13/2022 13.7 11.5 - 15.5 % Final  12/27/2021 12.2 11.7 - 15.4 % Final  08/18/2013 13.6 11.5 - 14.5 % Final         Passed - Valid encounter within last 6 months    Recent Outpatient Visits           4 months ago Dysuria   [facility] Mer[facility] Norton T, FNP   6 months ago Late onset Alzheimer's disease without behavioral disturbance [facility])   [place] [facility] T, FNP   1 year ago Essential (primary) hypertension   [place] Merita Norton T, FNP   1 year ago Late onset Alzheimer's disease without behavioral disturbance (HCC)   [place] [facility] Chrismon, Jodell Cipro, PA-C   2 years ago Right leg pain   [place] [facility] [place], Alessandra [place], [place]       Future Appointments             In 4 months Jacky Kindle, FNP [facility]             FARXIGA 10 MG TABS tablet [Pharmacy Med Name: Marcelline Deist  TABLET] 90 tablet 3    Sig: TAKE ONE TABLET BY MOUTH DAILY BEFORE BREAKFAST     Endocrinology:  Diabetes - SGLT2 Inhibitors Failed - 01/13/2023  3:46 PM      Failed - Cr in normal range and within 360 days    Creatinine  Date Value Ref Range Status  08/18/2013 0.97 0.60 - 1.30 mg/dL Final   Creatinine, Ser  Date Value Ref Range Status  07/13/2022 1.18 (H) 0.44 - 1.00 mg/dL Final         Failed - HBA1C is between 0 and 7.9 and within 180 days    Hemoglobin A1C  Date Value Ref Range Status  11/21/2014 5.9  Final   Hgb A1c MFr Bld  Date Value Ref Range Status  07/02/2022 5.7 (H) 4.8 - 5.6 % Final    Comment:             Prediabetes: 5.7 - 6.4           Diabetes: >6.4          Glycemic control for adults with diabetes: <7.0          Failed - eGFR in normal range and within 360 days    EGFR (African American)  Date Value Ref Range Status  08/18/2013 >60  Final   GFR calc Af Amer  Date Value Ref Range Status  09/06/2019 47 (L) >59 mL/min/1.73 Final   EGFR (Non-African Amer.)  Date Value Ref Range Status  08/18/2013 57 (L)  Final    Comment:    eGFR values <64mL/min/1.73 m2 may be an indication of chronic kidney disease (CKD). Calculated eGFR is useful in  patients with stable renal function. The eGFR calculation will not be reliable in acutely ill patients when serum creatinine is changing rapidly. It is not useful in  patients on dialysis. The eGFR calculation may not be applicable to patients at the low and high extremes of body sizes, pregnant women, and vegetarians.    GFR, Estimated  Date Value Ref Range Status  07/13/2022 46 (L) >60 mL/min Final    Comment:    (NOTE) Calculated using the CKD-EPI Creatinine Equation (2021)    eGFR  Date Value Ref Range Status  07/02/2022 33 (L) >59 mL/min/1.73 Final         Passed - Valid encounter within last 6 months    Recent Outpatient Visits           4 months ago Dysuria   [facility] Mer[facility] Norton T, FNP   6 months ago Late onset Alzheimer's disease without behavioral disturbance [facility])   [place] [facility] Mer[facility] Norton T, FNP   1 year ago Essential (primary) hypertension   [place] [facility] Mer[facility] Norton T, FNP   1 year ago Late onset Alzheimer's disease without behavioral disturbance (HCC)   [place] [facility] Chrismon, Jodell Cipro, PA-C   2 years ago Right leg pain   [place], Alessandra [place], [place]       Future Appointments             In 4 months Jacky Kindle, FNP [facility]            Refused  Prescriptions Disp Refills   lovastatin (MEVACOR) 40 MG tablet [Pharmacy Med Name: LOVASTATIN 40MG  TABLET] 100 tablet 3    Sig: TAKE ONE TABLET BY MOUTH EVERY NIGHT AT BEDTIME (NEED OFFICE VISIT FOR FUTURE REFILLS)     Cardiovascular:  Antilipid - Statins 2 Failed - 01/13/2023  3:46 PM      Failed - Cr in normal range and within 360 days    Creatinine  Date Value Ref Range Status  08/18/2013 0.97 0.60 - 1.30 mg/dL Final   Creatinine, Ser  Date Value Ref Range Status  07/13/2022 1.18 (H) 0.44 - 1.00 mg/dL Final         Failed - Lipid Panel in normal range within the last 12 months    Cholesterol, Total  Date Value Ref Range Status  07/02/2022 163 100 - 199 mg/dL Final   LDL Chol Calc (NIH)  Date Value Ref Range Status  07/02/2022 80 0 - 99 mg/dL Final   HDL  Date Value Ref Range Status  07/02/2022 43 >39 mg/dL Final   Triglycerides  Date Value Ref Range Status  07/02/2022 243 (H) 0 - 149 mg/dL Final         Passed - Patient is not pregnant      Passed - Valid encounter within last 12 months    Recent Outpatient Visits           4 months ago Dysuria   [facility] Merita Norton T, FNP   6 months ago Late onset Alzheimer's disease without behavioral disturbance [facility])   [place] [facility] Jacky Kindle, FNP   1 year ago Essential (primary) hypertension   [facility] Merita Norton T, FNP   1 year ago Late onset Alzheimer's disease without behavioral disturbance [facility])   [place] [facility] Chrismon, Jodell Cipro, PA-C   2 years ago Right leg pain   [facility], Alessandra [place], [place]       Future Appointments             In 4 months Suzie Portela, Daryl Eastern, FNP [facility]

## 2023-01-13 NOTE — Telephone Encounter (Signed)
Requested medication (s) are due for refill today - expired Rx  Requested medication (s) are on the active medication list -yes  Future visit scheduled -yes  Last refill: Diclofenac 01/08/22- expired Rx                  Metformin 01/10/22- expired Rx                   Notes to clinic: expired Rx- attempted to call daughter- last OV- 09/09/22- due 6 month visit- June-left message to schedule  Requested Prescriptions  Pending Prescriptions Disp Refills   diclofenac (VOLTAREN) 50 MG EC tablet [Pharmacy Med Name: DICLOFENAC SODIUM DR 50MG  DR TABLET DR] 60 tablet 11    Sig: TAKE ONE TABLET BY MOUTH TWO TIMES DAILY AS NEEDED     Analgesics:  NSAIDS Failed - 01/13/2023  3:46 PM      Failed - Manual Review: Labs are only required if the patient has taken medication for more than 8 weeks.      Failed - Cr in normal range and within 360 days    Creatinine  Date Value Ref Range Status  08/18/2013 0.97 0.60 - 1.30 mg/dL Final   Creatinine, Ser  Date Value Ref Range Status  07/13/2022 1.18 (H) 0.44 - 1.00 mg/dL Final         Failed - HGB in normal range and within 360 days    Hemoglobin  Date Value Ref Range Status  07/13/2022 17.0 (H) 12.0 - 15.0 g/dL Final  16/06/9603 54.0 11.1 - 15.9 g/dL Final         Failed - HCT in normal range and within 360 days    HCT  Date Value Ref Range Status  07/13/2022 51.3 (H) 36.0 - 46.0 % Final   Hematocrit  Date Value Ref Range Status  12/27/2021 46.4 34.0 - 46.6 % Final         Passed - PLT in normal range and within 360 days    Platelets  Date Value Ref Range Status  07/13/2022 316 150 - 400 K/uL Final  12/27/2021 270 150 - 450 x10E3/uL Final         Passed - eGFR is 30 or above and within 360 days    EGFR (African American)  Date Value Ref Range Status  08/18/2013 >60  Final   GFR calc Af Amer  Date Value Ref Range Status  09/06/2019 47 (L) >59 mL/min/1.73 Final   EGFR (Non-African Amer.)  Date Value Ref Range Status  08/18/2013  57 (L)  Final    Comment:    eGFR values <76mL/min/1.73 m2 may be an indication of chronic kidney disease (CKD). Calculated eGFR is useful in patients with stable renal function. The eGFR calculation will not be reliable in acutely ill patients when serum creatinine is changing rapidly. It is not useful in  patients on dialysis. The eGFR calculation may not be applicable to patients at the low and high extremes of body sizes, pregnant women, and vegetarians.    GFR, Estimated  Date Value Ref Range Status  07/13/2022 46 (L) >60 mL/min Final    Comment:    (NOTE) Calculated using the CKD-EPI Creatinine Equation (2021)    eGFR  Date Value Ref Range Status  07/02/2022 33 (L) >59 mL/min/1.73 Final         Passed - Patient is not pregnant      Passed - Valid encounter within last 12 months    Recent  Outpatient Visits           4 months ago Dysuria   [facility] Merita Norton T, FNP   6 months ago Late onset Alzheimer's disease without behavioral disturbance [facility])   [place] [facility] Mer[facility] Norton T, FNP   1 year ago Essential (primary) hypertension   [place] [facility] Mer[facility] Norton T, FNP   1 year ago Late onset Alzheimer's disease without behavioral disturbance [facility])   [place] [facility] Chrismon, Jodell Cipro, PA-C   2 years ago Right leg pain   [place] [facility] [place], Alessandra [place], [place]       Future Appointments             In 4 months Jacky Kindle, FNP [facility]             metFORMIN (GLUCOPHAGE-XR) 750 MG 24 hr tablet [Pharmacy Med Name: METFORMIN HYDROCHLORIDE ER  ER TABLET ER 24HR] 30 tablet 11    Sig: TAKE ONE TABLET BY MOUTH DAILY WITH BREAKFAST     Endocrinology:  Diabetes - Biguanides Failed - 01/13/2023  3:46 PM      Failed - Cr in normal range and within 360 days    Creatinine  Date Value Ref Range Status   08/18/2013 0.97 0.60 - 1.30 mg/dL Final   Creatinine, Ser  Date Value Ref Range Status  07/13/2022 1.18 (H) 0.44 - 1.00 mg/dL Final         Failed - HBA1C is between 0 and 7.9 and within 180 days    Hemoglobin A1C  Date Value Ref Range Status  11/21/2014 5.9  Final   Hgb A1c MFr Bld  Date Value Ref Range Status  07/02/2022 5.7 (H) 4.8 - 5.6 % Final    Comment:             Prediabetes: 5.7 - 6.4          Diabetes: >6.4          Glycemic control for adults with diabetes: <7.0          Failed - eGFR in normal range and within 360 days    EGFR (African American)  Date Value Ref Range Status  08/18/2013 >60  Final   GFR calc Af Amer  Date Value Ref Range Status  09/06/2019 47 (L) >59 mL/min/1.73 Final   EGFR (Non-African Amer.)  Date Value Ref Range Status  08/18/2013 57 (L)  Final    Comment:    eGFR values <81mL/min/1.73 m2 may be an indication of chronic kidney disease (CKD). Calculated eGFR is useful in patients with stable renal function. The eGFR calculation will not be reliable in acutely ill patients when serum creatinine is changing rapidly. It is not useful in  patients on dialysis. The eGFR calculation may not be applicable to patients at the low and high extremes of body sizes, pregnant women, and vegetarians.    GFR, Estimated  Date Value Ref Range Status  07/13/2022 46 (L) >60 mL/min Final    Comment:    (NOTE) Calculated using the CKD-EPI Creatinine Equation (2021)    eGFR  Date Value Ref Range Status  07/02/2022 33 (L) >59 mL/min/1.73 Final         Failed - B12 Level in normal range and within 720 days    No results found for: "VITAMINB12"       Failed - CBC within  normal limits and completed in the last 12 months    WBC  Date Value Ref Range Status  07/13/2022 7.9 4.0 - 10.5 K/uL Final   RBC  Date Value Ref Range Status  07/13/2022 5.71 (H) 3.87 - 5.11 MIL/uL Final   Hemoglobin  Date Value Ref Range Status  07/13/2022 17.0 (H) 12.0  - 15.0 g/dL Final  29/56/2130 86.5 11.1 - 15.9 g/dL Final   HCT  Date Value Ref Range Status  07/13/2022 51.3 (H) 36.0 - 46.0 % Final   Hematocrit  Date Value Ref Range Status  12/27/2021 46.4 34.0 - 46.6 % Final   MCHC  Date Value Ref Range Status  07/13/2022 33.1 30.0 - 36.0 g/dL Final   [facility] [place] [facility]  Date Value Ref Range Status  07/13/2022 29.8 26.0 - 34.0 pg Final   MCV  Date Value Ref Range Status  07/13/2022 89.8 80.0 - 100.0 fL Final  12/27/2021 91 79 - 97 fL Final  08/18/2013 89 80 - 100 fL Final   No results found for: "PLTCOUNTKUC", "LABPLAT", "POCPLA" RDW  Date Value Ref Range Status  07/13/2022 13.7 11.5 - 15.5 % Final  12/27/2021 12.2 11.7 - 15.4 % Final  08/18/2013 13.6 11.5 - 14.5 % Final         Passed - Valid encounter within last 6 months    Recent Outpatient Visits           4 months ago Dysuria   [facility] Mer[facility] Norton T, FNP   6 months ago Late onset Alzheimer's disease without behavioral disturbance [facility])   [place] [facility] T, FNP   1 year ago Essential (primary) hypertension   [place] [facility] Merita Norton T, FNP   1 year ago Late onset Alzheimer's disease without behavioral disturbance (HCC)   [place] [facility] Chrismon, Jodell Cipro, PA-C   2 years ago Right leg pain   [place] [facility] [place], Alessandra [place], [place]       Future Appointments             In 4 months Jacky Kindle, FNP [facility], [facility]            Signed Prescriptions Disp Refills   dapagliflozin propanediol (FARXIGA) 10 MG TABS tablet 90 tablet 0    Sig: TAKE ONE TABLET BY MOUTH DAILY BEFORE BREAKFAST     Endocrinology:  Diabetes - SGLT2 Inhibitors Failed - 01/13/2023  3:46 PM      Failed - Cr in normal range and within 360 days    Creatinine  Date Value Ref Range Status  08/18/2013 0.97 0.60 - 1.30 mg/dL Final    Creatinine, Ser  Date Value Ref Range Status  07/13/2022 1.18 (H) 0.44 - 1.00 mg/dL Final         Failed - HBA1C is between 0 and 7.9 and within 180 days    Hemoglobin A1C  Date Value Ref Range Status  11/21/2014 5.9  Final   Hgb A1c MFr Bld  Date Value Ref Range Status  07/02/2022 5.7 (H) 4.8 - 5.6 % Final    Comment:             Prediabetes: 5.7 - 6.4          Diabetes: >6.4          Glycemic control for adults with diabetes: <7.0  Failed - eGFR in normal range and within 360 days    EGFR (African American)  Date Value Ref Range Status  08/18/2013 >60  Final   GFR calc Af Amer  Date Value Ref Range Status  09/06/2019 47 (L) >59 mL/min/1.73 Final   EGFR (Non-African Amer.)  Date Value Ref Range Status  08/18/2013 57 (L)  Final    Comment:    eGFR values <20mL/min/1.73 m2 may be an indication of chronic kidney disease (CKD). Calculated eGFR is useful in patients with stable renal function. The eGFR calculation will not be reliable in acutely ill patients when serum creatinine is changing rapidly. It is not useful in  patients on dialysis. The eGFR calculation may not be applicable to patients at the low and high extremes of body sizes, pregnant women, and vegetarians.    GFR, Estimated  Date Value Ref Range Status  07/13/2022 46 (L) >60 mL/min Final    Comment:    (NOTE) Calculated using the CKD-EPI Creatinine Equation (2021)    eGFR  Date Value Ref Range Status  07/02/2022 33 (L) >59 mL/min/1.73 Final         Passed - Valid encounter within last 6 months    Recent Outpatient Visits           4 months ago Dysuria   [facility] Mer[facility] T, FNP   6 months ago Late onset Alzheimer's disease without behavioral disturbance [facility])   [place] [facility] Mer[facility] Norton T, FNP   1 year ago Essential (primary) hypertension   [place] [facility] Merita Norton T, FNP   1 year ago Late  onset Alzheimer's disease without behavioral disturbance (HCC)   [place] [facility] Chrismon, Jodell Cipro, PA-C   2 years ago Right leg pain   [place] [facility] [place], Alessandra [place], [place]       Future Appointments             In 4 months Jacky Kindle, FNP  [facility], [facility]            Refused Prescriptions Disp Refills   lovastatin (MEVACOR) 40 MG tablet [Pharmacy Med Name: LOVASTATIN 40MG  TABLET] 100 tablet 3    Sig: TAKE ONE TABLET BY MOUTH EVERY NIGHT AT BEDTIME (NEED OFFICE VISIT FOR FUTURE REFILLS)     Cardiovascular:  Antilipid - Statins 2 Failed - 01/13/2023  3:46 PM      Failed - Cr in normal range and within 360 days    Creatinine  Date Value Ref Range Status  08/18/2013 0.97 0.60 - 1.30 mg/dL Final   Creatinine, Ser  Date Value Ref Range Status  07/13/2022 1.18 (H) 0.44 - 1.00 mg/dL Final         Failed - Lipid Panel in normal range within the last 12 months    Cholesterol, Total  Date Value Ref Range Status  07/02/2022 163 100 - 199 mg/dL Final   LDL Chol Calc (NIH)  Date Value Ref Range Status  07/02/2022 80 0 - 99 mg/dL Final   HDL  Date Value Ref Range Status  07/02/2022 43 >39 mg/dL Final   Triglycerides  Date Value Ref Range Status  07/02/2022 243 (H) 0 - 149 mg/dL Final         Passed - Patient is not pregnant      Passed - Valid encounter within last 12 months    Recent Outpatient  Visits           4 months ago Dysuria   [facility], FNP   6 months ago Late onset Alzheimer's disease without behavioral disturbance [facility])   [place] [facility] Mer[facility] Norton T, FNP   1 year ago Essential (primary) hypertension   [place] [facility] Merita Norton T, FNP   1 year ago Late onset Alzheimer's disease without behavioral disturbance [facility])   [place] [facility] Chrismon, Jodell Cipro, PA-C    2 years ago Right leg pain   [place] [facility] [place], Alessandra [place], [place]       Future Appointments             In 4 months Suzie Portela, Daryl Eastern, FNP [place] [facility], [facility]               Requested Prescriptions  Pending Prescriptions Disp Refills   diclofenac (VOLTAREN) 50 MG EC tablet [Pharmacy Med Name: DICLOFENAC SODIUM DR 50MG  DR TABLET DR] 60 tablet 11    Sig: TAKE ONE TABLET BY MOUTH TWO TIMES DAILY AS NEEDED     Analgesics:  NSAIDS Failed - 01/13/2023  3:46 PM      Failed - Manual Review: Labs are only required if the patient has taken medication for more than 8 weeks.      Failed - Cr in normal range and within 360 days    Creatinine  Date Value Ref Range Status  08/18/2013 0.97 0.60 - 1.30 mg/dL Final   Creatinine, Ser  Date Value Ref Range Status  07/13/2022 1.18 (H) 0.44 - 1.00 mg/dL Final         Failed - HGB in normal range and within 360 days    Hemoglobin  Date Value Ref Range Status  07/13/2022 17.0 (H) 12.0 - 15.0 g/dL Final  40/98/1191 47.8 11.1 - 15.9 g/dL Final         Failed - HCT in normal range and within 360 days    HCT  Date Value Ref Range Status  07/13/2022 51.3 (H) 36.0 - 46.0 % Final   Hematocrit  Date Value Ref Range Status  12/27/2021 46.4 34.0 - 46.6 % Final         Passed - PLT in normal range and within 360 days    Platelets  Date Value Ref Range Status  07/13/2022 316 150 - 400 K/uL Final  12/27/2021 270 150 - 450 x10E3/uL Final         Passed - eGFR is 30 or above and within 360 days    EGFR (African American)  Date Value Ref Range Status  08/18/2013 >60  Final   GFR calc Af Amer  Date Value Ref Range Status  09/06/2019 47 (L) >59 mL/min/1.73 Final   EGFR (Non-African Amer.)  Date Value Ref Range Status  08/18/2013 57 (L)  Final    Comment:    eGFR values <50mL/min/1.73 m2 may be an indication of chronic kidney disease (CKD). Calculated eGFR is useful in patients with  stable renal function. The eGFR calculation will not be reliable in acutely ill patients when serum creatinine is changing rapidly. It is not useful in  patients on dialysis. The eGFR calculation may not be applicable to patients at the low and high extremes of body sizes, pregnant women, and vegetarians.    GFR, Estimated  Date Value Ref Range Status  07/13/2022 46 (L) >  60 mL/min Final    Comment:    (NOTE) Calculated using the CKD-EPI Creatinine Equation (2021)    eGFR  Date Value Ref Range Status  07/02/2022 33 (L) >59 mL/min/1.73 Final         Passed - Patient is not pregnant      Passed - Valid encounter within last 12 months    Recent Outpatient Visits           4 months ago Dysuria   [facility] Mer[facility] T, FNP   6 months ago Late onset Alzheimer's disease without behavioral disturbance [facility])   [place] [facility] Mer[facility] Norton T, FNP   1 year ago Essential (primary) hypertension   [place] [facility] Merita Norton T, FNP   1 year ago Late onset Alzheimer's disease without behavioral disturbance (HCC)   [place] [facility] Chrismon, Jodell Cipro, PA-C   2 years ago Right leg pain   [place] [facility] [place], Alessandra [place], [place]       Future Appointments             In 4 months Jacky Kindle, FNP [facility], [facility]             metFORMIN (GLUCOPHAGE-XR) 750 MG 24 hr tablet [Pharmacy Med Name: METFORMIN HYDROCHLORIDE ER 750MG  ER TABLET ER 24HR] 30 tablet 11    Sig: TAKE ONE TABLET BY MOUTH DAILY WITH BREAKFAST     Endocrinology:  Diabetes - Biguanides Failed - 01/13/2023  3:46 PM      Failed - Cr in normal range and within 360 days    Creatinine  Date Value Ref Range Status  08/18/2013 0.97 0.60 - 1.30 mg/dL Final   Creatinine, Ser  Date Value Ref Range Status  07/13/2022 1.18 (H) 0.44 - 1.00 mg/dL Final         Failed -  HBA1C is between 0 and 7.9 and within 180 days    Hemoglobin A1C  Date Value Ref Range Status  11/21/2014 5.9  Final   Hgb A1c MFr Bld  Date Value Ref Range Status  07/02/2022 5.7 (H) 4.8 - 5.6 % Final    Comment:             Prediabetes: 5.7 - 6.4          Diabetes: >6.4          Glycemic control for adults with diabetes: <7.0          Failed - eGFR in normal range and within 360 days    EGFR (African American)  Date Value Ref Range Status  08/18/2013 >60  Final   GFR calc Af Amer  Date Value Ref Range Status  09/06/2019 47 (L) >59 mL/min/1.73 Final   EGFR (Non-African Amer.)  Date Value Ref Range Status  08/18/2013 57 (L)  Final    Comment:    eGFR values <20mL/min/1.73 m2 may be an indication of chronic kidney disease (CKD). Calculated eGFR is useful in patients with stable renal function. The eGFR calculation will not be reliable in acutely ill patients when serum creatinine is changing rapidly. It is not useful in  patients on dialysis. The eGFR calculation may not be applicable to patients at the low and high extremes of body sizes, pregnant women, and vegetarians.    GFR, Estimated  Date Value Ref Range Status  07/13/2022 46 (L) >60 mL/min Final  Comment:    (NOTE) Calculated using the CKD-EPI Creatinine Equation (2021)    eGFR  Date Value Ref Range Status  07/02/2022 33 (L) >59 mL/min/1.73 Final         Failed - B12 Level in normal range and within 720 days    No results found for: "VITAMINB12"       Failed - CBC within normal limits and completed in the last 12 months    WBC  Date Value Ref Range Status  07/13/2022 7.9 4.0 - 10.5 K/uL Final   RBC  Date Value Ref Range Status  07/13/2022 5.71 (H) 3.87 - 5.11 MIL/uL Final   Hemoglobin  Date Value Ref Range Status  07/13/2022 17.0 (H) 12.0 - 15.0 g/dL Final  69/62/9528 41.3 11.1 - 15.9 g/dL Final   HCT  Date Value Ref Range Status  07/13/2022 51.3 (H) 36.0 - 46.0 % Final   Hematocrit   Date Value Ref Range Status  12/27/2021 46.4 34.0 - 46.6 % Final   MCHC  Date Value Ref Range Status  07/13/2022 33.1 30.0 - 36.0 g/dL Final   [facility]  Date Value Ref Range Status  07/13/2022 29.8 26.0 - 34.0 pg Final   MCV  Date Value Ref Range Status  07/13/2022 89.8 80.0 - 100.0 fL Final  12/27/2021 91 79 - 97 fL Final  08/18/2013 89 80 - 100 fL Final   No results found for: "PLTCOUNTKUC", "LABPLAT", "POCPLA" RDW  Date Value Ref Range Status  07/13/2022 13.7 11.5 - 15.5 % Final  12/27/2021 12.2 11.7 - 15.4 % Final  08/18/2013 13.6 11.5 - 14.5 % Final         Passed - Valid encounter within last 6 months    Recent Outpatient Visits           4 months ago Dysuria   [facility] Merita Norton T, FNP   6 months ago Late onset Alzheimer's disease without behavioral disturbance [facility])   [place] [facility] Merita Norton T, FNP   1 year ago Essential (primary) hypertension   [facility] Merita Norton T, FNP   1 year ago Late onset Alzheimer's disease without behavioral disturbance (HCC)   [place] [facility] Chrismon, Jodell Cipro, PA-C   2 years ago Right leg pain   [place] [facility] [place], Alessandra [place], [place]       Future Appointments             In 4 months Jacky Kindle, FNP  [facility], [facility]            Signed Prescriptions Disp Refills   dapagliflozin propanediol (FARXIGA) 10 MG TABS tablet 90 tablet 0    Sig: TAKE ONE TABLET BY MOUTH DAILY BEFORE BREAKFAST     Endocrinology:  Diabetes - SGLT2 Inhibitors Failed - 01/13/2023  3:46 PM      Failed - Cr in normal range and within 360 days    Creatinine  Date Value Ref Range Status  08/18/2013 0.97 0.60 - 1.30 mg/dL Final   Creatinine, Ser  Date Value Ref Range Status  07/13/2022 1.18 (H) 0.44 - 1.00 mg/dL Final         Failed - HBA1C is between 0 and 7.9 and within 180 days     Hemoglobin A1C  Date Value Ref Range Status  11/21/2014 5.9  Final   Hgb A1c MFr Bld  Date Value Ref  Range Status  07/02/2022 5.7 (H) 4.8 - 5.6 % Final    Comment:             Prediabetes: 5.7 - 6.4          Diabetes: >6.4          Glycemic control for adults with diabetes: <7.0          Failed - eGFR in normal range and within 360 days    EGFR (African American)  Date Value Ref Range Status  08/18/2013 >60  Final   GFR calc Af Amer  Date Value Ref Range Status  09/06/2019 47 (L) >59 mL/min/1.73 Final   EGFR (Non-African Amer.)  Date Value Ref Range Status  08/18/2013 57 (L)  Final    Comment:    eGFR values <67mL/min/1.73 m2 may be an indication of chronic kidney disease (CKD). Calculated eGFR is useful in patients with stable renal function. The eGFR calculation will not be reliable in acutely ill patients when serum creatinine is changing rapidly. It is not useful in  patients on dialysis. The eGFR calculation may not be applicable to patients at the low and high extremes of body sizes, pregnant women, and vegetarians.    GFR, Estimated  Date Value Ref Range Status  07/13/2022 46 (L) >60 mL/min Final    Comment:    (NOTE) Calculated using the CKD-EPI Creatinine Equation (2021)    eGFR  Date Value Ref Range Status  07/02/2022 33 (L) >59 mL/min/1.73 Final         Passed - Valid encounter within last 6 months    Recent Outpatient Visits           4 months ago Dysuria   [facility] Mer[facility] Norton T, FNP   6 months ago Late onset Alzheimer's disease without behavioral disturbance [facility])   [place] [facility] Mer[facility] Norton T, FNP   1 year ago Essential (primary) hypertension   [place] [facility] Merita Norton T, FNP   1 year ago Late onset Alzheimer's disease without behavioral disturbance (HCC)   [place] [facility] Chrismon, Jodell Cipro, PA-C   2 years ago Right leg pain    [place] [facility] [place], Alessandra [place], [place]       Future Appointments             In 4 months Jacky Kindle, FNP  [facility], [facility]            Refused Prescriptions Disp Refills   lovastatin (MEVACOR) 40 MG tablet [Pharmacy Med Name: LOVASTATIN 40MG  TABLET] 100 tablet 3    Sig: TAKE ONE TABLET BY MOUTH EVERY NIGHT AT BEDTIME (NEED OFFICE VISIT FOR FUTURE REFILLS)     Cardiovascular:  Antilipid - Statins 2 Failed - 01/13/2023  3:46 PM      Failed - Cr in normal range and within 360 days    Creatinine  Date Value Ref Range Status  08/18/2013 0.97 0.60 - 1.30 mg/dL Final   Creatinine, Ser  Date Value Ref Range Status  07/13/2022 1.18 (H) 0.44 - 1.00 mg/dL Final         Failed - Lipid Panel in normal range within the last 12 months    Cholesterol, Total  Date Value Ref Range Status  07/02/2022 163 100 - 199 mg/dL Final   LDL Chol Calc (NIH)  Date Value Ref Range Status  07/02/2022 80 0 - 99  mg/dL Final   HDL  Date Value Ref Range Status  07/02/2022 43 >39 mg/dL Final   Triglycerides  Date Value Ref Range Status  07/02/2022 243 (H) 0 - 149 mg/dL Final         Passed - Patient is not pregnant      Passed - Valid encounter within last 12 months    Recent Outpatient Visits           4 months ago Dysuria   [facility] Mer[facility] Norton T, FNP   6 months ago Late onset Alzheimer's disease without behavioral disturbance [facility])   [place] [facility] Jacky Kindle, FNP   1 year ago Essential (primary) hypertension   [place] [facility] Merita Norton T, FNP   1 year ago Late onset Alzheimer's disease without behavioral disturbance [facility])   [place] [facility] Chrismon, Jodell Cipro, PA-C   2 years ago Right leg pain   [place] [facility] [place], Alessandra [place], [place]       Future Appointments             In 4 months Suzie Portela,  Daryl Eastern, FNP [facility], [facility]

## 2023-02-09 NOTE — Telephone Encounter (Signed)
Pt daughter called in to schedule an appt for the pt for 03/10/23 at 1pm for medication refill. Pt daughter is wanting to see if her medication can be filled now.

## 2023-02-10 ENCOUNTER — Other Ambulatory Visit: Payer: Self-pay | Admitting: Physician Assistant

## 2023-02-10 DIAGNOSIS — R4189 Other symptoms and signs involving cognitive functions and awareness: Secondary | ICD-10-CM

## 2023-02-10 DIAGNOSIS — F0280 Dementia in other diseases classified elsewhere without behavioral disturbance: Secondary | ICD-10-CM

## 2023-02-19 NOTE — Telephone Encounter (Signed)
Daughter calle asking why the metformin and Judi Cong

## 2023-02-19 NOTE — Telephone Encounter (Signed)
Pts daughter called asking why the metformin and Voltaren was denied.  Pt has an apt coming up soon and is out of these medications.

## 2023-02-23 ENCOUNTER — Ambulatory Visit: Payer: Self-pay

## 2023-02-23 DIAGNOSIS — M1990 Unspecified osteoarthritis, unspecified site: Secondary | ICD-10-CM

## 2023-02-23 NOTE — Telephone Encounter (Signed)
  Chief Complaint: Missing medications Symptoms: na Frequency: na Pertinent Negatives: Patient denies na Disposition: [] ED /[] Urgent Care (no appt availability in office) / [] Appointment(In office/virtual)/ []  [facility]/ [] Home Care/ [] Refused Recommended Disposition /[] [facility]/ [x]  Follow-up with PCP Additional Notes: Called back daughter [name] regarding missing meds.  Pt is out of these meds and refills have been denied.  Daughter would like these refilled. Pt has an upcoming office visit scheduled.  Daughter states that unless meds are packaged with other medications, pt will not take them. Please advise.    Summary: Pt daughter concerned about pt refills   Pt daughter Lenda Kelp requests that a nurse return her call because this is her second time calling about pt medications. [name] stated she called last week but the refill request has not been approved. Cb#  [phone]     Reason for Disposition  [1] Prescription refill request for ESSENTIAL medicine (i.e., likelihood of harm to patient if not taken) AND [2] triager unable to refill per department policy  Answer Assessment - Initial Assessment Questions 1. DRUG NAME: "What medicine do you need to have refilled?"     Metformin and Voltaren 2. REFILLS REMAINING: "How many refills are remaining?" (Note: The label on the medicine or pill bottle will show how many refills are remaining. If there are no refills remaining, then a renewal may be needed.)     none 4. PRESCRIBING HCP: "Who prescribed it?" Reason: If prescribed by specialist, call should be referred to that group.     Merita Norton  Protocols used: Medication Refill and Renewal Call-A-AH

## 2023-02-24 ENCOUNTER — Other Ambulatory Visit: Payer: Self-pay | Admitting: Family Medicine

## 2023-02-24 DIAGNOSIS — M1990 Unspecified osteoarthritis, unspecified site: Secondary | ICD-10-CM

## 2023-02-25 ENCOUNTER — Other Ambulatory Visit: Payer: Self-pay | Admitting: Family Medicine

## 2023-03-09 DIAGNOSIS — N1832 Chronic kidney disease, stage 3b: Secondary | ICD-10-CM | POA: Diagnosis not present

## 2023-03-09 DIAGNOSIS — R600 Localized edema: Secondary | ICD-10-CM | POA: Diagnosis not present

## 2023-03-09 DIAGNOSIS — I10 Essential (primary) hypertension: Secondary | ICD-10-CM | POA: Diagnosis not present

## 2023-03-09 DIAGNOSIS — I129 Hypertensive chronic kidney disease with stage 1 through stage 4 chronic kidney disease, or unspecified chronic kidney disease: Secondary | ICD-10-CM | POA: Diagnosis not present

## 2023-03-09 DIAGNOSIS — E876 Hypokalemia: Secondary | ICD-10-CM | POA: Diagnosis not present

## 2023-03-10 ENCOUNTER — Encounter: Payer: Self-pay | Admitting: Family Medicine

## 2023-03-10 ENCOUNTER — Ambulatory Visit (INDEPENDENT_AMBULATORY_CARE_PROVIDER_SITE_OTHER): Payer: Medicare Other | Admitting: Family Medicine

## 2023-03-10 VITALS — BP 156/70 | HR 70 | Ht 62.0 in | Wt 119.0 lb

## 2023-03-10 DIAGNOSIS — I10 Essential (primary) hypertension: Secondary | ICD-10-CM | POA: Diagnosis not present

## 2023-03-10 DIAGNOSIS — G309 Alzheimer's disease, unspecified: Secondary | ICD-10-CM

## 2023-03-10 DIAGNOSIS — Z9181 History of falling: Secondary | ICD-10-CM

## 2023-03-10 DIAGNOSIS — F0280 Dementia in other diseases classified elsewhere without behavioral disturbance: Secondary | ICD-10-CM | POA: Diagnosis not present

## 2023-03-10 MED ORDER — METOPROLOL SUCCINATE ER 25 MG PO TB24: ORAL_TABLET | ORAL | 11 refills | Status: AC

## 2023-03-10 MED ORDER — VALSARTAN 80 MG PO TABS: 80.0000 mg | ORAL_TABLET | Freq: Every day | ORAL | 0 refills | Status: DC

## 2023-03-11 ENCOUNTER — Ambulatory Visit: Payer: Medicare Other | Admitting: Physician Assistant

## 2023-03-12 DIAGNOSIS — Z9181 History of falling: Secondary | ICD-10-CM | POA: Insufficient documentation

## 2023-03-12 NOTE — Assessment & Plan Note (Signed)
Minor bruising to R wrist s/p fall; no pain or mobility deficits on exam Family defers xrays at this time Continue to monitor if RUE for favoring

## 2023-03-12 NOTE — Progress Notes (Signed)
Established patient visit   Patient: Lauren Manning   DOB: Dec 21, 1935   87 y.o. Female  MRN: [id number] Visit Date: 03/10/2023  Today's healthcare provider: Jacky Kindle, FNP  Re Introduced to nurse practitioner role and practice setting.  All questions answered.  Discussed provider/patient relationship and expectations.  Chief Complaint  Patient presents with   Medication Refill   Injury    Larey Seat down right  wrist-bruise- 2 weeks. Denied pain Skin tag on the lower back.   Subjective    Medication Refill  Injury   HPI     Injury    Additional comments: Larey Seat down right  wrist-bruise- 2 weeks. Denied pain Skin tag on the lower back.      Last edited by Shelly Bombard, CMA on 03/10/2023  1:06 PM.      Medications: Outpatient Medications Prior to Visit  Medication Sig   aspirin 81 MG tablet Take 81 mg by mouth daily.   dapagliflozin propanediol (FARXIGA) 10 MG TABS tablet TAKE ONE TABLET BY MOUTH DAILY BEFORE BREAKFAST   diclofenac (VOLTAREN) 50 MG EC tablet TAKE ONE TABLET BY MOUTH TWO TIMES DAILY AS NEEDED   hydrochlorothiazide (HYDRODIURIL) 25 MG tablet Take 1 tablet (25 mg total) by mouth daily.   lovastatin (MEVACOR) 40 MG tablet TAKE 1 TABLET BY MOUTH EVERY NIGHT AT BEDTIME (NEED OFFICE VISIT FOR FUTURE REFILLS)   metFORMIN (GLUCOPHAGE-XR) 750 MG 24 hr tablet TAKE ONE TABLET BY MOUTH DAILY WITH BREAKFAST   nortriptyline (PAMELOR) 25 MG capsule Take 1 capsule (25 mg total) by mouth at bedtime.   potassium chloride (KLOR-CON) 10 MEQ tablet Take 1 tablet (10 mEq total) by mouth 2 (two) times daily for 3 days.   rivastigmine (EXELON) 3 MG capsule TAKE ONE CAPSULE BY MOUTH TWO TIMES DAILY   [DISCONTINUED] amLODipine (NORVASC) 10 MG tablet Take 1 tablet (10 mg total) by mouth daily.   [DISCONTINUED] metoprolol succinate (TOPROL-XL) 25 MG 24 hr tablet TAKE 1 TABLET BY MOUTH ONCE A DAY *MUST HAVE APPT FOR FURTHER REFILLS*   No facility-administered medications prior to  visit.    Review of Systems  Last CBC Lab Results  Component Value Date   WBC 7.9 07/13/2022   HGB 17.0 (H) 07/13/2022   HCT 51.3 (H) 07/13/2022   MCV 89.8 07/13/2022   MCH 29.8 07/13/2022   RDW 13.7 07/13/2022   PLT 316 07/13/2022   Last metabolic panel Lab Results  Component Value Date   GLUCOSE 147 (H) 07/13/2022   NA 136 07/13/2022   K 2.7 (LL) 07/13/2022   CL 100 07/13/2022   CO2 23 07/13/2022   BUN 19 07/13/2022   CREATININE 1.18 (H) 07/13/2022   GFRNONAA 46 (L) 07/13/2022   CALCIUM 9.6 07/13/2022   PROT 8.0 07/13/2022   ALBUMIN 4.1 07/13/2022   LABGLOB 2.9 07/02/2022   AGRATIO 1.7 07/02/2022   BILITOT 0.8 07/13/2022   ALKPHOS 60 07/13/2022   AST 25 07/13/2022   ALT 17 07/13/2022   ANIONGAP 13 07/13/2022   Last lipids Lab Results  Component Value Date   CHOL 163 07/02/2022   HDL 43 07/02/2022   LDLCALC 80 07/02/2022   TRIG 243 (H) 07/02/2022   CHOLHDL 3.8 07/02/2022   Last hemoglobin A1c Lab Results  Component Value Date   HGBA1C 5.7 (H) 07/02/2022   Last thyroid functions Lab Results  Component Value Date   TSH 2.980 11/23/2015     Objective    BP Marland Kitchen)  156/70 (BP Location: Right Arm, Patient Position: Sitting, Cuff Size: Normal)   Pulse 70   Ht 5\' 2"  (1.575 m)   Wt 119 lb (54 kg)   SpO2 98%   BMI 21.77 kg/m   BP Readings from Last 3 Encounters:  03/10/23 (!) 156/70  09/09/22 (!) 149/77  09/09/22 (!) 151/79   Wt Readings from Last 3 Encounters:  03/10/23 119 lb (54 kg)  01/06/23 122 lb (55.3 kg)  09/09/22 122 lb 14.4 oz (55.7 kg)   SpO2 Readings from Last 3 Encounters:  03/10/23 98%  09/09/22 99%  09/09/22 99%   Physical Exam Vitals and nursing note reviewed.  Constitutional:      General: She is not in acute distress.    Appearance: Normal appearance. She is normal weight. She is not ill-appearing, toxic-appearing or diaphoretic.  HENT:     Head: Normocephalic and atraumatic.  Cardiovascular:     Rate and Rhythm: Normal  rate and regular rhythm.     Pulses: Normal pulses.     Heart sounds: Normal heart sounds. No murmur heard.    No friction rub. No gallop.  Pulmonary:     Effort: Pulmonary effort is normal. No respiratory distress.     Breath sounds: Normal breath sounds. No stridor. No wheezing, rhonchi or rales.  Chest:     Chest wall: No tenderness.  Musculoskeletal:        General: No swelling, tenderness, deformity or signs of injury. Normal range of motion.     Right lower leg: No edema.     Left lower leg: No edema.  Skin:    General: Skin is warm and dry.     Capillary Refill: Capillary refill takes less than 2 seconds.     Coloration: Skin is not jaundiced or pale.     Findings: No bruising, erythema, lesion or rash.  Neurological:     General: No focal deficit present.     Mental Status: She is alert and oriented to person, place, and time. Mental status is at baseline.     Cranial Nerves: No cranial nerve deficit.     Sensory: No sensory deficit.     Motor: No weakness.     Coordination: Coordination normal.  Psychiatric:        Mood and Affect: Mood normal.        Behavior: Behavior normal.        Thought Content: Thought content normal.        Judgment: Judgment normal.     No results found for any visits on 03/10/23.  Assessment & Plan     Problem List Items Addressed This Visit       Cardiovascular and Mediastinum   Essential hypertension    Chronic, borderline  Continue to monitor Goal <139/89 Trial of diovan to assist; defer use of hydrochlorothiazide at this time with c/f gait imbalance and recent fall Continue to monitor at f/u and nephro made aware      Relevant Medications   metoprolol succinate (TOPROL-XL) 25 MG 24 hr tablet   valsartan (DIOVAN) 80 MG tablet     Nervous and Auditory   Major neurocognitive disorder due to Alzheimer's disease, without behavioral disturbance    Chronic, worsening Continues to live alone; found down by preacher on this past  Sunday on her way to church Unknown down time Remains on exelon and pamelor; continue to monitor Daughter notes that patient may move in with her in the future  Other   History of recent fall - Primary    Minor bruising to R wrist s/p fall; no pain or mobility deficits on exam Family defers xrays at this time Continue to monitor if RUE for favoring      No follow-ups on file.     Leilani Merl, FNP, have reviewed all documentation for this visit. The documentation on 03/12/23 for the exam, diagnosis, procedures, and orders are all accurate and complete.  Jacky Kindle, FNP  [facility] [phone] (phone) [phone] (fax)  [facility]

## 2023-03-12 NOTE — Assessment & Plan Note (Signed)
Chronic, worsening Continues to live alone; found down by preacher on this past Sunday on her way to church Unknown down time Remains on exelon and pamelor; continue to monitor Daughter notes that patient may move in with her in the future

## 2023-03-12 NOTE — Assessment & Plan Note (Signed)
Chronic, borderline  Continue to monitor Goal <139/89 Trial of diovan to assist; defer use of hydrochlorothiazide at this time with c/f gait imbalance and recent fall Continue to monitor at f/u and nephro made aware

## 2023-03-17 ENCOUNTER — Ambulatory Visit: Payer: Medicare Other | Admitting: Physician Assistant

## 2023-03-17 ENCOUNTER — Encounter: Payer: Self-pay | Admitting: Physician Assistant

## 2023-03-17 DIAGNOSIS — G301 Alzheimer's disease with late onset: Secondary | ICD-10-CM

## 2023-03-17 DIAGNOSIS — G309 Alzheimer's disease, unspecified: Secondary | ICD-10-CM

## 2023-03-17 DIAGNOSIS — F0280 Dementia in other diseases classified elsewhere without behavioral disturbance: Secondary | ICD-10-CM | POA: Diagnosis not present

## 2023-03-17 DIAGNOSIS — R4189 Other symptoms and signs involving cognitive functions and awareness: Secondary | ICD-10-CM

## 2023-03-17 MED ORDER — RIVASTIGMINE TARTRATE 4.5 MG PO CAPS
4.5000 mg | ORAL_CAPSULE | Freq: Two times a day (BID) | ORAL | 11 refills | Status: AC
Start: 2023-03-17 — End: ?

## 2023-03-17 NOTE — Patient Instructions (Signed)
It was a pleasure to see you today at our office.   Recommendations:  Follow up  as needed  Increase Exelon 4.5 mg twice a day     Whom to call:  Memory  decline, memory medications: Call our office [phone]   For psychiatric meds, mood meds: Please have your primary care physician manage these medications.   Counseling regarding caregiver distress, including caregiver depression, anxiety and issues regarding community resources, adult day care programs, adult living facilities, or memory care questions:   Feel free to contact Misty Lisabeth Register, Social Worker at [phone]   For assessment of decision of mental capacity and competency:  Call Dr. Erick Blinks, geriatric psychiatrist at [phone]  For guidance in geriatric dementia issues please call [facility] [phone]    If you have any severe symptoms of a stroke, or other severe issues such as confusion,severe chills or fever, etc call 911 or go to the ER as you may need to be evaluated further   Feel free to visit Facebook page " [email or web address]" for tips of how to care for people with memory problems.      RECOMMENDATIONS FOR ALL PATIENTS WITH MEMORY PROBLEMS: 1. Continue to exercise (Recommend 30 minutes of walking everyday, or 3 hours every week) 2. Increase social interactions - continue going to [place] and enjoy social gatherings with friends and family 3. Eat healthy, avoid fried foods and eat more fruits and vegetables 4. Maintain adequate blood pressure, blood sugar, and blood cholesterol level. Reducing the risk of stroke and cardiovascular disease also helps promoting better memory. 5. Avoid stressful situations. Live a simple life and avoid aggravations. Organize your time and prepare for the next day in anticipation. 6. Sleep well, avoid any interruptions of sleep and avoid any distractions in the bedroom that may interfere with adequate sleep quality 7. Avoid sugar, avoid sweets as there is a  strong link between excessive sugar intake, diabetes, and cognitive impairment We discussed the Mediterranean diet, which has been shown to help patients reduce the risk of progressive memory disorders and reduces cardiovascular risk. This includes eating fish, eat fruits and green leafy vegetables, nuts like almonds and hazelnuts, walnuts, and also use olive oil. Avoid fast foods and fried foods as much as possible. Avoid sweets and sugar as sugar use has been linked to worsening of memory function.  There is always a concern of gradual progression of memory problems. If this is the case, then we may need to adjust level of care according to patient needs. Support, both to the patient and caregiver, should then be put into place.    FALL PRECAUTIONS: Be cautious when walking. Scan the area for obstacles that may increase the risk of trips and falls. When getting up in the mornings, sit up at the edge of the bed for a few minutes before getting out of bed. Consider elevating the bed at the head end to avoid drop of blood pressure when getting up. Walk always in a well-lit room (use night lights in the walls). Avoid area rugs or power cords from appliances in the middle of the walkways. Use a walker or a cane if necessary and consider physical therapy for balance exercise. Get your eyesight checked regularly.  FINANCIAL OVERSIGHT: Supervision, especially oversight when making financial decisions or transactions is also recommended.  HOME SAFETY: Consider the safety of the kitchen when operating appliances like stoves, microwave oven, and blender. Consider having supervision and share cooking responsibilities until no  longer able to participate in those. Accidents with firearms and other hazards in the house should be identified and addressed as well.   ABILITY TO BE LEFT ALONE: If patient is unable to contact 911 operator, consider using LifeLine, or when the need is there, arrange for someone to stay  with patients. Smoking is a fire hazard, consider supervision or cessation. Risk of wandering should be assessed by caregiver and if detected at any point, supervision and safe proof recommendations should be instituted.  MEDICATION SUPERVISION: Inability to self-administer medication needs to be constantly addressed. Implement a mechanism to ensure safe administration of the medications.   DRIVING: Regarding driving, in patients with progressive memory problems, driving will be impaired. We advise to have someone else do the driving if trouble finding directions or if minor accidents are reported. Independent driving assessment is available to determine safety of driving.   If you are interested in the driving assessment, you can contact the following:  [employer] [facility] in [place] [phone]  [facility] [phone]  [facility] [phone]  [facility] [phone] or [phone]

## 2023-03-17 NOTE — Progress Notes (Signed)
Assessment/Plan:   Memory Impairment  Lauren Manning is a very pleasant 87 y.o. RH femalewith a history of hypertension, hyperlipidemia, stage III CKD and history of major neurocognitive disorder likely due to his liver disease per Neuropsych evaluation December 2021 seen today in follow up for memory loss. Patient is currently Lauren Manning 3 mg twice daily, tolerating well. Slight cognitive decline is noted, MMSE today is 17/30. Patient is moving to the [facility] and will be transferring care to a neurologist at her location. Daughter to contact us when records are to be transferred for continuation of care. It has been a privilege providing care to this nice patient.     Follow up as needed Increase Manning to 4.5 mg twice daily. Side effects discussed  Recommend good control of her cardiovascular risk factors Continue to control mood as per PCP    Subjective:    This patient is accompanied in the office by her daughter who supplements the history.  Previous records as well as any outside records available were reviewed prior to todays visit. Patient was last seen Lauren 09/09/2022 with an MMSE of 19/30.    Any changes in memory since last visit? " It may be worse "-daughter says.  She may forget recent conversations and names of people. She also has a repetitive behavior with  R her hand "runs it against the leg " repeats oneself?  Endorsed Disoriented when walking into a room?  Patient denies    Leaving objects in unusual places?  May misplace things but not in unusual places, "lost her underwear the other day".  Wandering behavior?  denies   Any personality changes since last visit?  denies   Any worsening depression?:  denies   Hallucinations or paranoia?  denies   Seizures? denies    Any sleep changes?  Denies vivid dreams, REM behavior or sleepwalking   Sleep apnea?   denies   Any hygiene concerns? " I don't know how often  she does"-daughter days. Patient reports  that she bathes every 3 days. Independent of bathing and dressing?  Endorsed  Does the patient needs help with medications? She gets her meds in [employer] packs, daughter and granddaughter monitor for the medicines.  Who is in charge of the finances?  Daughter is in charge     Any changes in appetite? She may forget that she did not eat.  She drinks 3 to 4 glasses of water a day at most .    Patient have trouble swallowing? denies   Does the patient cook? No Any headaches?   denies   Chronic back pain  denies   Ambulates with difficulty?  "balance is off a little bit" but  does not like to use the walker.  Uses a R cane Recent falls or head injuries? 6/9 she had unwitnessed fall, the pastor found her Lauren the floor no apparent LOC, R hand had some bruises.  Unilateral weakness, numbness or tingling? denies   Any tremors?  denies   Any anosmia?  Patient denies   Any incontinence of urine? Denies Any bowel dysfunction?   denies      Patient lives by herself, she is planning to move to the [place] to be with the family within the next 2 months .   Does the patient drive? No longer drives      History Lauren Initial Assessment 11/23/2019: This is an 87 year old right-handed woman with a history of hypertension, hyperlipidemia,  Alzheimer's disease, presenting for evaluation of dementia. Her daughter Lauren Manning is present to provide additional information. Family started noticing memory changes several years ago. She underwent Neuropsychological testing with Dr. 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 Cognitive profile is suggestive of medial-temporal lobe involvement. She had been Lauren Donepezil 5mg  daily, dose was increased to 10mg  daily. It was noted that she should continue to receive assistance with complex ADLs, and would be well suited for a continuing care retirement community or assisted living  where she could have increased oversight of medications and meals. She continues to live alone in a senior living apartment complex where her neighbors look out for her. She manages her own medications and denies missing any doses. Lauren Manning works close by and comes for lunch sometimes, and sees her pillbox would be empty, but states she did not take her medications this morning. One time, a neighbor called, they apparently called EMS because she went to her neighbor saying she thinks she took her medication twice, EMS checked her BP and she seemed fine. She stopped driving in [id number]. Lauren Manning took over finances in 2014. She denies misplacing things, however Lauren Manning reports that she could not find her cane or keys this morning. She cannot tell Lauren Manning what she ate, and thinks she is eating the same things repeatedly. She has had some weight loss. She denies leaving the stove Lauren. She is independent with dressing and bathing, she is "always cleaning," and house is well-kept. Lauren Manning has noticed that her mood has changed quite a bit, she gets irritated easily. She used to have birds and talked about strangling them because they were so noisy. They got rid of the birds this week. She cusses a lot more than before. No paranoia or hallucinations. She states her mood is okay. She has difficulties with sleep initiation and was prescribed nortriptyline, but they don't think she filled the prescription.    She denies any headaches, dizziness, diplopia, dysarthria, dysphagia, neck/back pain, focal numbness/tingling/weakness, bowel dysfunction. No anosmia, tremors. She has had diarrhea for "years" occurring 2-3 times a week. She had a bout this morning. Her daughter reports diverticulitis in the past, and that she still eats dairy because she forgets that she should avoid it. She reports a fall this morning, she lost her balance. No injuries. She usually ambulates with a cane. Her mother had Alzheimer's disease. No history of  significant head injuries. No alcohol use.   I personally reviewed head CT without contrast done 11/2017 which did not show any acute changes. There was moderate degree of periventricular and subcortical white matter hypoattenuation compatible with chronic microvascular ischemia. Mild atrophy with ventricular sulcal prominence  PREVIOUS MEDICATIONS:   CURRENT MEDICATIONS:  Outpatient Encounter Medications as of 03/17/2023  Medication Sig   aspirin 81 MG tablet Take 81 mg by mouth daily.   dapagliflozin propanediol (FARXIGA) 10 MG TABS tablet TAKE ONE TABLET BY MOUTH DAILY BEFORE BREAKFAST   diclofenac (VOLTAREN) 50 MG EC tablet TAKE ONE TABLET BY MOUTH TWO TIMES DAILY AS NEEDED   hydrochlorothiazide (HYDRODIURIL) 25 MG tablet Take 1 tablet (25 mg total) by mouth daily.   lovastatin (MEVACOR) 40 MG tablet TAKE 1 TABLET BY MOUTH EVERY NIGHT AT BEDTIME (NEED OFFICE VISIT FOR FUTURE REFILLS)   metFORMIN (GLUCOPHAGE-XR) 750 MG 24 hr tablet TAKE ONE TABLET BY MOUTH DAILY WITH BREAKFAST   metoprolol succinate (TOPROL-XL) 25 MG 24 hr tablet  TAKE 1 TABLET BY MOUTH ONCE A DAY   nortriptyline (PAMELOR) 25 MG capsule Take 1 capsule (25 mg total) by mouth at bedtime.   potassium chloride (KLOR-CON) 10 MEQ tablet Take 1 tablet (10 mEq total) by mouth 2 (two) times daily for 3 days.   Manning (EXELON) 4.5 MG capsule Take 1 capsule (4.5 mg total) by mouth 2 (two) times daily.   valsartan (DIOVAN) 80 MG tablet Take 1 tablet (80 mg total) by mouth daily.   [DISCONTINUED] Manning (EXELON) 3 MG capsule TAKE ONE CAPSULE BY MOUTH TWO TIMES DAILY   No facility-administered encounter medications Lauren file as of 03/17/2023.       03/17/2023    3:00 PM 09/09/2022   12:00 PM 09/10/2017    1:37 PM  MMSE - Mini Mental State Exam  Orientation to time 2 1 2   Orientation to Place 1 3 3   Registration 3 3 2   Attention/ Calculation 3 4 5   Recall 0 0 1  Language- name 2 objects 2 2 2   Language- repeat 1 1 1    Language- follow 3 step command 3 3 3   Language- read & follow direction 1 1 1   Write a sentence 1 1 1   Copy design 0 0 1  Total score 17 19 22        No data to display          Objective:     PHYSICAL EXAMINATION:    VITALS:   Vitals:   03/17/23 1430 03/17/23 1531  BP: (!) 177/90 (!) 160/80  Pulse: 72   Resp: 20   SpO2: 98%   Height: 5\' 2"  (1.575 m)     GEN:  The patient appears stated age and is in NAD. HEENT:  Normocephalic, atraumatic.   Neurological examination:  General: NAD, well-groomed, appears stated age. Orientation: The patient is alert. Oriented to person, not to place or date Cranial nerves: There is good facial symmetry.The speech is fluent and clear. No aphasia or dysarthria. Fund of knowledge is reduced. Recent and remote memory are impaired. Attention and concentration are reduced.  Able to name objects and repeat phrases.  Hearing is intact to conversational tone.   Sensation: Sensation is intact to light touch throughout Motor: Strength is at least antigravity x4. DTR's 2/4 in UE/LE     Movement examination: Tone: There is normal tone in the UE/LE Abnormal movements:  no tremor.  No myoclonus.  No asterixis.   Coordination:  There is no decremation with RAM's. Normal finger to nose  Gait and Station: The patient has no difficulty arising out of a deep-seated chair without the use of the hands. Needs a cane to ambulate The patient's stride length is good.  Gait is cautious and narrow.    Thank you for allowing [place] the opportunity to participate in the care of this nice patient. Please do not hesitate to contact us for any questions or concerns.   Total time spent Lauren today's visit was 35 minutes dedicated to this patient today, preparing to see patient, examining the patient, ordering tests and/or medications and counseling the patient, documenting clinical information in the EHR or other health record, independently interpreting results and  communicating results to the patient/family, discussing treatment and goals, answering patient's questions and coordinating care.  Cc:  Jacky Kindle, FNP  Marlowe Kays 03/17/2023 3:35 PM

## 2023-03-18 ENCOUNTER — Ambulatory Visit: Payer: Medicare Other | Admitting: Physician Assistant

## 2023-04-13 ENCOUNTER — Other Ambulatory Visit: Payer: Self-pay | Admitting: Family Medicine

## 2023-04-13 DIAGNOSIS — E7800 Pure hypercholesterolemia, unspecified: Secondary | ICD-10-CM

## 2023-04-14 NOTE — Telephone Encounter (Signed)
Requested medications are due for refill today.  yes  Requested medications are on the active medications list.  yes  Last refill. Lovastatin 02/26/2022 #100 3 rf, Valsartan 03/10/2023 #30 0 rf  Future visit scheduled.   yes  Notes to clinic.  Abnormal labs.    Requested Prescriptions  Pending Prescriptions Disp Refills   lovastatin (MEVACOR) 40 MG tablet [Pharmacy Med Name: LOVASTATIN 40MG  TABLET] 100 tablet 3    Sig: TAKE ONE TABLET BY MOUTH EVERY NIGHT AT BEDTIME (NEED OFFICE VISIT FOR FUTURE REFILLS)     Cardiovascular:  Antilipid - Statins 2 Failed - 04/13/2023  3:05 PM      Failed - Cr in normal range and within 360 days    Creatinine  Date Value Ref Range Status  08/18/2013 0.97 0.60 - 1.30 mg/dL Final   Creatinine, Ser  Date Value Ref Range Status  07/13/2022 1.18 (H) 0.44 - 1.00 mg/dL Final         Failed - Lipid Panel in normal range within the last 12 months    Cholesterol, Total  Date Value Ref Range Status  07/02/2022 163 100 - 199 mg/dL Final   LDL Chol Calc (NIH)  Date Value Ref Range Status  07/02/2022 80 0 - 99 mg/dL Final   HDL  Date Value Ref Range Status  07/02/2022 43 >39 mg/dL Final   Triglycerides  Date Value Ref Range Status  07/02/2022 243 (H) 0 - 149 mg/dL Final         Passed - Patient is not pregnant      Passed - Valid encounter within last 12 months    Recent Outpatient Visits           1 month ago History of recent fall   [facility] Jacky Kindle, FNP   7 months ago Dysuria   [facility] Merita Norton T, FNP   9 months ago Late onset Alzheimer's disease without behavioral disturbance [facility])   [facility] Merita Norton T, FNP   1 year ago Essential (primary) hypertension   [facility] Merita Norton T, FNP   2 years ago Late onset Alzheimer's disease without behavioral disturbance [facility])   [facility] Chrismon, Jodell Cipro, PA-C       Future Appointments             In 1 month Jacky Kindle, FNP [facility]             valsartan (DIOVAN) 80 MG tablet [Pharmacy Med Name: VALSARTAN 80MG  TABLET] 30 tablet 0    Sig: TAKE ONE TABLET (80 MG TOTAL) BY MOUTH DAILY.     Cardiovascular:  Angiotensin Receptor Blockers Failed - 04/13/2023  3:05 PM      Failed - Cr in normal range and within 180 days    Creatinine  Date Value Ref Range Status  08/18/2013 0.97 0.60 - 1.30 mg/dL Final   Creatinine, Ser  Date Value Ref Range Status  07/13/2022 1.18 (H) 0.44 - 1.00 mg/dL Final         Failed - K in normal range and within 180 days    Potassium  Date Value Ref Range Status  07/13/2022 2.7 (LL) 3.5 - 5.1 mmol/L Final    Comment:    CRITICAL RESULT CALLED TO, READ BACK BY AND VERIFIED WITH TIFFANY JOHNSON @1431  07/13/22 MJU   08/18/2013 3.2 (  L) 3.5 - 5.1 mmol/L Final         Failed - Last BP in normal range    BP Readings from Last 1 Encounters:  03/17/23 (!) 160/80         Passed - Patient is not pregnant      Passed - Valid encounter within last 6 months    Recent Outpatient Visits           1 month ago History of recent fall   [facility] Jacky Kindle, FNP   7 months ago Dysuria   [facility] Merita Norton T, FNP   9 months ago Late onset Alzheimer's disease without behavioral disturbance [facility])   [facility] Jacky Kindle, FNP   1 year ago Essential (primary) hypertension   [facility] Merita Norton T, FNP   2 years ago Late onset Alzheimer's disease without behavioral disturbance [facility])   [facility] [facility] Chrismon, Jodell Cipro, PA-C       Future Appointments             In 1 month Suzie Portela, Daryl Eastern, FNP [facility], [facility]

## 2023-05-06 ENCOUNTER — Ambulatory Visit
Admission: RE | Admit: 2023-05-06 | Discharge: 2023-05-06 | Disposition: A | Discharge status: Home / Self Care | Payer: Medicare Other | Attending: Obstetrics and Gynecology | Admitting: Obstetrics and Gynecology

## 2023-05-06 ENCOUNTER — Ambulatory Visit: Payer: Medicare Other | Admitting: Anesthesiology

## 2023-05-06 ENCOUNTER — Encounter: Payer: Self-pay | Admitting: Obstetrics and Gynecology

## 2023-05-06 ENCOUNTER — Ambulatory Visit: Payer: Medicare Other | Admitting: Obstetrics and Gynecology

## 2023-05-06 ENCOUNTER — Other Ambulatory Visit: Payer: Self-pay

## 2023-05-06 ENCOUNTER — Encounter
Admission: RE | Disposition: A | Discharge status: Home / Self Care | Payer: Self-pay | Source: Home / Self Care | Attending: Obstetrics and Gynecology

## 2023-05-06 VITALS — BP 149/89 | HR 88 | Resp 16 | Ht 63.0 in | Wt 115.4 lb

## 2023-05-06 DIAGNOSIS — F0280 Dementia in other diseases classified elsewhere without behavioral disturbance: Secondary | ICD-10-CM | POA: Diagnosis not present

## 2023-05-06 DIAGNOSIS — S3663XA Laceration of rectum, initial encounter: Secondary | ICD-10-CM | POA: Diagnosis not present

## 2023-05-06 DIAGNOSIS — K5641 Fecal impaction: Secondary | ICD-10-CM

## 2023-05-06 DIAGNOSIS — Z87440 Personal history of urinary (tract) infections: Secondary | ICD-10-CM | POA: Diagnosis not present

## 2023-05-06 DIAGNOSIS — X58XXXA Exposure to other specified factors, initial encounter: Secondary | ICD-10-CM | POA: Diagnosis not present

## 2023-05-06 DIAGNOSIS — S3141XA Laceration without foreign body of vagina and vulva, initial encounter: Secondary | ICD-10-CM

## 2023-05-06 DIAGNOSIS — N183 Chronic kidney disease, stage 3 unspecified: Secondary | ICD-10-CM | POA: Diagnosis not present

## 2023-05-06 DIAGNOSIS — S3142XD Laceration with foreign body of vagina and vulva, subsequent encounter: Secondary | ICD-10-CM

## 2023-05-06 DIAGNOSIS — I129 Hypertensive chronic kidney disease with stage 1 through stage 4 chronic kidney disease, or unspecified chronic kidney disease: Secondary | ICD-10-CM | POA: Diagnosis not present

## 2023-05-06 DIAGNOSIS — I10 Essential (primary) hypertension: Secondary | ICD-10-CM | POA: Insufficient documentation

## 2023-05-06 DIAGNOSIS — S3660XA Unspecified injury of rectum, initial encounter: Secondary | ICD-10-CM | POA: Diagnosis not present

## 2023-05-06 DIAGNOSIS — N952 Postmenopausal atrophic vaginitis: Secondary | ICD-10-CM | POA: Diagnosis not present

## 2023-05-06 DIAGNOSIS — M1990 Unspecified osteoarthritis, unspecified site: Secondary | ICD-10-CM | POA: Diagnosis not present

## 2023-05-06 DIAGNOSIS — I2510 Atherosclerotic heart disease of native coronary artery without angina pectoris: Secondary | ICD-10-CM | POA: Diagnosis not present

## 2023-05-06 DIAGNOSIS — K219 Gastro-esophageal reflux disease without esophagitis: Secondary | ICD-10-CM | POA: Diagnosis not present

## 2023-05-06 DIAGNOSIS — N8110 Cystocele, unspecified: Secondary | ICD-10-CM

## 2023-05-06 DIAGNOSIS — Z4689 Encounter for fitting and adjustment of other specified devices: Secondary | ICD-10-CM | POA: Diagnosis not present

## 2023-05-06 DIAGNOSIS — G309 Alzheimer's disease, unspecified: Secondary | ICD-10-CM | POA: Insufficient documentation

## 2023-05-06 HISTORY — PX: RECTOCELE REPAIR: SHX761

## 2023-05-06 LAB — CBC
HCT: 45 % (ref 36.0–46.0)
Hemoglobin: 14.2 g/dL (ref 12.0–15.0)
MCH: 29.5 pg (ref 26.0–34.0)
MCHC: 31.6 g/dL (ref 30.0–36.0)
MCV: 93.6 fL (ref 80.0–100.0)
Platelets: 256 10*3/uL (ref 150–400)
RBC: 4.81 MIL/uL (ref 3.87–5.11)
RDW: 13.8 % (ref 11.5–15.5)
WBC: 7 10*3/uL (ref 4.0–10.5)
nRBC: 0 % (ref 0.0–0.2)

## 2023-05-06 SURGERY — COLPORRHAPHY, POSTERIOR, FOR RECTOCELE REPAIR
Anesthesia: General | Site: Vagina

## 2023-05-06 MED ORDER — ACETAMINOPHEN 500 MG PO TABS
ORAL_TABLET | ORAL | Status: AC
  Filled 2023-05-06: qty 2

## 2023-05-06 MED ORDER — DEXAMETHASONE SODIUM PHOSPHATE 10 MG/ML IJ SOLN
INTRAMUSCULAR | Status: DC | PRN
  Administered 2023-05-06: 5 mg via INTRAVENOUS

## 2023-05-06 MED ORDER — ONDANSETRON HCL 4 MG/2ML IJ SOLN
INTRAMUSCULAR | Status: DC | PRN
  Administered 2023-05-06: 4 mg via INTRAVENOUS

## 2023-05-06 MED ORDER — PROPOFOL 10 MG/ML IV BOLUS
INTRAVENOUS | Status: DC | PRN
Start: 2023-05-06 — End: 2023-05-06
  Administered 2023-05-06: 80 mg via INTRAVENOUS

## 2023-05-06 MED ORDER — HYDROCODONE-ACETAMINOPHEN 5-325 MG PO TABS: 1.0000 | ORAL_TABLET | Freq: Four times a day (QID) | ORAL | 0 refills | Status: DC | PRN

## 2023-05-06 MED ORDER — LACTATED RINGERS IV SOLN: INTRAVENOUS | Status: DC

## 2023-05-06 MED ORDER — DOCUSATE SODIUM 100 MG PO CAPS: 100.0000 mg | ORAL_CAPSULE | Freq: Two times a day (BID) | ORAL | 2 refills | Status: AC | PRN

## 2023-05-06 MED ORDER — METRONIDAZOLE 500 MG PO TABS: 500.0000 mg | ORAL_TABLET | Freq: Two times a day (BID) | ORAL | 0 refills | Status: AC

## 2023-05-06 MED ORDER — 0.9 % SODIUM CHLORIDE (POUR BTL) OPTIME
TOPICAL | Status: DC | PRN
  Administered 2023-05-06: 500 mL

## 2023-05-06 MED ORDER — SODIUM CHLORIDE 0.9 % IV SOLN: INTRAVENOUS | Status: DC

## 2023-05-06 MED ORDER — FENTANYL CITRATE (PF) 100 MCG/2ML IJ SOLN: 25.0000 ug | INTRAMUSCULAR | Status: DC | PRN

## 2023-05-06 MED ORDER — OXYCODONE HCL 5 MG/5ML PO SOLN: 5.0000 mg | Freq: Once | ORAL | Status: DC | PRN

## 2023-05-06 MED ORDER — SODIUM CHLORIDE 0.9 % IV SOLN
INTRAVENOUS | Status: AC
  Filled 2023-05-06: qty 2

## 2023-05-06 MED ORDER — ONDANSETRON HCL 4 MG/2ML IJ SOLN: 4.0000 mg | Freq: Once | INTRAMUSCULAR | Status: DC | PRN

## 2023-05-06 MED ORDER — FENTANYL CITRATE (PF) 100 MCG/2ML IJ SOLN
INTRAMUSCULAR | Status: DC | PRN
  Administered 2023-05-06 (×2): 25 ug via INTRAVENOUS
  Administered 2023-05-06: 12.5 ug via INTRAVENOUS

## 2023-05-06 MED ORDER — ROCURONIUM BROMIDE 100 MG/10ML IV SOLN
INTRAVENOUS | Status: DC | PRN
  Administered 2023-05-06: 30 mg via INTRAVENOUS
  Administered 2023-05-06: 10 mg via INTRAVENOUS

## 2023-05-06 MED ORDER — OXYCODONE HCL 5 MG PO TABS: 5.0000 mg | ORAL_TABLET | Freq: Once | ORAL | Status: DC | PRN

## 2023-05-06 MED ORDER — METRONIDAZOLE 500 MG PO TABS
500.0000 mg | ORAL_TABLET | Freq: Once | ORAL | Status: AC
  Administered 2023-05-06: 500 mg via ORAL
  Filled 2023-05-06: qty 1

## 2023-05-06 MED ORDER — LIDOCAINE HCL (CARDIAC) PF 100 MG/5ML IV SOSY
PREFILLED_SYRINGE | INTRAVENOUS | Status: DC | PRN
  Administered 2023-05-06: 50 mg via INTRAVENOUS

## 2023-05-06 MED ORDER — CELECOXIB 200 MG PO CAPS
ORAL_CAPSULE | ORAL | Status: AC
  Filled 2023-05-06: qty 2

## 2023-05-06 MED ORDER — CHLORHEXIDINE GLUCONATE 0.12 % MT SOLN
OROMUCOSAL | Status: AC
  Filled 2023-05-06: qty 15

## 2023-05-06 MED ORDER — CELECOXIB 200 MG PO CAPS
400.0000 mg | ORAL_CAPSULE | ORAL | Status: AC
  Administered 2023-05-06: 400 mg via ORAL

## 2023-05-06 MED ORDER — FENTANYL CITRATE (PF) 100 MCG/2ML IJ SOLN
INTRAMUSCULAR | Status: AC
  Filled 2023-05-06: qty 2

## 2023-05-06 MED ORDER — HYDROCODONE-ACETAMINOPHEN 5-325 MG PO TABS: 1.0000 | ORAL_TABLET | Freq: Four times a day (QID) | ORAL | 0 refills | Status: AC | PRN

## 2023-05-06 MED ORDER — ACETAMINOPHEN 500 MG PO TABS
1000.0000 mg | ORAL_TABLET | ORAL | Status: AC
  Administered 2023-05-06: 1000 mg via ORAL

## 2023-05-06 MED ORDER — SUGAMMADEX SODIUM 200 MG/2ML IV SOLN
INTRAVENOUS | Status: DC | PRN
  Administered 2023-05-06: 150 mg via INTRAVENOUS

## 2023-05-06 MED ORDER — PROPOFOL 10 MG/ML IV BOLUS
INTRAVENOUS | Status: AC
  Filled 2023-05-06: qty 20

## 2023-05-06 MED ORDER — SODIUM CHLORIDE 0.9 % IV SOLN
2.0000 g | Freq: Once | INTRAVENOUS | Status: AC
  Administered 2023-05-06: 2 g via INTRAVENOUS

## 2023-05-06 MED ORDER — ONDANSETRON HCL 4 MG/2ML IJ SOLN
INTRAMUSCULAR | Status: AC
  Filled 2023-05-06: qty 2

## 2023-05-06 MED ORDER — ACETAMINOPHEN 10 MG/ML IV SOLN: 1000.0000 mg | Freq: Once | INTRAVENOUS | Status: DC | PRN

## 2023-05-06 SURGICAL SUPPLY — 43 items
BAG COUNTER SPONGE SURGICOUNT (BAG) ×2 IMPLANT
BAG DRN RND TRDRP ANRFLXCHMBR (UROLOGICAL SUPPLIES)
BAG SPNG CNTER NS LX DISP (BAG)
BAG URINE DRAIN 2000ML AR STRL (UROLOGICAL SUPPLIES) ×2 IMPLANT
BLADE SURG 15 STRL LF DISP TIS (BLADE) ×2 IMPLANT
BLADE SURG 15 STRL SS (BLADE)
CATH FOLEY 2WAY 5CC 16FR (CATHETERS)
CATH ROBINSON RED A/P 16FR (CATHETERS) IMPLANT
CATH URTH 16FR FL 2W BLN LF (CATHETERS) ×2 IMPLANT
DRAPE LAP W/FLUID (DRAPES) IMPLANT
DRAPE PERI LITHO V/GYN (MISCELLANEOUS) ×2 IMPLANT
DRAPE SHEET LG 3/4 BI-LAMINATE (DRAPES) ×2 IMPLANT
DRAPE UNDER BUTTOCK W/FLU (DRAPES) ×2 IMPLANT
ELECT REM PT RETURN 9FT ADLT (ELECTROSURGICAL)
ELECTRODE REM PT RTRN 9FT ADLT (ELECTROSURGICAL) ×2 IMPLANT
GAUZE 4X4 16PLY ~~LOC~~+RFID DBL (SPONGE) ×4 IMPLANT
GAUZE PACK 2X3YD (PACKING) ×2 IMPLANT
GLOVE BIO SURGEON STRL SZ 6.5 (GLOVE) ×2 IMPLANT
GLOVE INDICATOR 7.0 STRL GRN (GLOVE) ×2 IMPLANT
GOWN STRL REUS W/ TWL LRG LVL3 (GOWN DISPOSABLE) ×4 IMPLANT
GOWN STRL REUS W/TWL LRG LVL3 (GOWN DISPOSABLE) ×4
KIT TURNOVER CYSTO (KITS) ×2 IMPLANT
LABEL OR SOLS (LABEL) ×2 IMPLANT
MANIFOLD NEPTUNE II (INSTRUMENTS) ×2 IMPLANT
NDL HYPO 22X1.5 SAFETY MO (MISCELLANEOUS) ×2 IMPLANT
NEEDLE HYPO 22X1.5 SAFETY MO (MISCELLANEOUS) IMPLANT
NS IRRIG 500ML POUR BTL (IV SOLUTION) ×2 IMPLANT
PACK BASIN MINOR ARMC (MISCELLANEOUS) ×2 IMPLANT
PAD PREP OB/GYN DISP 24X41 (PERSONAL CARE ITEMS) ×2 IMPLANT
RETRACTOR YANK SUCT EIGR SABER (INSTRUMENTS) IMPLANT
SCRUB CHG 4% DYNA-HEX 4OZ (MISCELLANEOUS) ×2 IMPLANT
SURGILUBE 2OZ TUBE FLIPTOP (MISCELLANEOUS) IMPLANT
SUT CHROMIC 2 0 CT 1 (SUTURE) ×4 IMPLANT
SUT VIC AB 0 CT1 36 (SUTURE) IMPLANT
SUT VIC AB 0 CT2 27 (SUTURE) IMPLANT
SUT VIC AB 2-0 UR6 27 (SUTURE) IMPLANT
SUT VICRYL 0 AB UR-6 (SUTURE) IMPLANT
SUT VICRYL+ 3-0 36IN CT-1 (SUTURE) IMPLANT
SYR 10ML LL (SYRINGE) ×2 IMPLANT
SYR CONTROL 10ML LL (SYRINGE) ×2 IMPLANT
TAPE TRANSPORE STRL 2 31045 (GAUZE/BANDAGES/DRESSINGS) ×2 IMPLANT
TRAP FLUID SMOKE EVACUATOR (MISCELLANEOUS) ×2 IMPLANT
WATER STERILE IRR 500ML POUR (IV SOLUTION) ×2 IMPLANT

## 2023-05-06 NOTE — Transfer of Care (Signed)
Immediate Anesthesia Transfer of Care Note  Patient: Lauren Manning  Procedure(s) Performed: POSTERIOR VAGINAL REPAIR (Vagina )  Patient Location: PACU  Anesthesia Type:General  Level of Consciousness: awake, drowsy, and patient cooperative  Airway & Oxygen Therapy: Patient Spontanous Breathing and Patient connected to face mask oxygen  Post-op Assessment: Report given to RN and Post -op Vital signs reviewed and stable  Post vital signs: Reviewed and stable  Last Vitals:  Vitals Value Taken Time  BP 150/70 05/06/2023  Temp    Pulse 62   Resp 13 05/06/23 2024  SpO2 99   Vitals shown include unfiled device data.  Last Pain:  Vitals:   05/06/23 1700  TempSrc: Oral         Complications: No notable events documented.

## 2023-05-06 NOTE — Consult Note (Signed)
Intraoperative consultation request through OB/GYN for rectal wall full-thickness injury.    Patient had a pessary removed in the office earlier today and there is some concerns of a posterior vaginal wall injury, possible rectal wall involvement due to the presence of stool within vaginal vault l.  IntraOp, confirmation of the injury noted with clear visualization of a through and through injury between anterior rectal wall and posterior vaginal wall.  Surgery consulted IntraOp for repair of the rectal defect.

## 2023-05-06 NOTE — Anesthesia Postprocedure Evaluation (Signed)
Anesthesia Post Note  Patient: Lauren Manning  Procedure(s) Performed: POSTERIOR VAGINAL REPAIR (Vagina )  Patient location during evaluation: PACU Anesthesia Type: General Level of consciousness: awake and alert, oriented and patient cooperative Pain management: pain level controlled Vital Signs Assessment: post-procedure vital signs reviewed and stable Respiratory status: spontaneous breathing, nonlabored ventilation and respiratory function stable Cardiovascular status: blood pressure returned to baseline and stable Postop Assessment: adequate PO intake Anesthetic complications: no   No notable events documented.   Last Vitals:  Vitals:   05/06/23 2030 05/06/23 2045  BP: (!) 144/64 (!) 166/71  Pulse: (!) 59 61  Resp: (!) 22 20  Temp:    SpO2: 97% 100%    Last Pain:  Vitals:   05/06/23 2045  TempSrc:   PainSc: 0-No pain                 Reed Breech

## 2023-05-06 NOTE — Anesthesia Procedure Notes (Signed)
Procedure Name: Intubation Date/Time: 05/06/2023 6:49 PM  Performed by: Elisabeth Pigeon, CRNAPre-anesthesia Checklist: Patient identified, Patient being monitored, Timeout performed, Emergency Drugs available and Suction available Patient Re-evaluated:Patient Re-evaluated prior to induction Oxygen Delivery Method: Circle system utilized Preoxygenation: Pre-oxygenation with 100% oxygen Induction Type: IV induction Ventilation: Mask ventilation without difficulty Laryngoscope Size: Mac, 3 and McGraph Grade View: Grade I Tube type: Oral Tube size: 6.5 mm Number of attempts: 1 Airway Equipment and Method: Stylet Placement Confirmation: ETT inserted through vocal cords under direct vision, positive ETCO2 and breath sounds checked- equal and bilateral Secured at: 18 cm Tube secured with: Tape Dental Injury: Teeth and Oropharynx as per pre-operative assessment

## 2023-05-06 NOTE — Op Note (Addendum)
Pre-Op Dx: Full-thickness anterior rectal wall injury Post-Op Dx: Same Anesthesia: GETA EBL: 10 mL Complications:  none apparent Specimen: None Procedure: Primary repair of full-thickness anterior rectal wall injury Surgeon: Tonna Boehringer   Indications for procedure: See consult note.  Description of Procedure:   This portion of procedure completed after repair of the vaginal wall by Dr. Valentino Saxon.  Placement of the speculum into the rectal vault visualized the prior defect that was noted.  Palpation of the area as well as visualization of the area further noted no clear communication between the rectum and vaginal cavity after the repair of the posterior wall of vagina.  No obvious stool contamination was noted within the area after some manual disimpaction and irrigation.  Under direct visualization, 0 Vicryl x 2 used to primary close the defect from within the rectal vault in any interrupted fashion..  Care taken to make sure to take deep bites to incorporate full-thickness of the wall.  Palpation and visualization of the area area afterwards confirmed approximation of the mucosal tissue to close the full-thickness injury.  Small portion at the most distal and proximal aspect loosely approximated to allow any residual drainage of contents between the vaginal and rectal wall to minimize risk of developing a abscess in the area.  Confirmation of patent rectal lumen noted more proximal to the area of repair where more stool was visible.  The entire rectal vault was then extensively irrigated again and soaked with Betadine infused sponge to minimize contamination to the area.  Sponge and instrument count correct at end of procedure.  Procedures then returned to Dr. Valentino Saxon.  Recommended continuing outpatient antibiotics and monitoring for the area of development of abscess symptoms.  Patient and family supposedly is moving out of town within the next couple weeks.  Recommended that they have adequate access to  a healthcare facility if any issues arise.

## 2023-05-06 NOTE — H&P (Signed)
@[facility] @   GYNECOLOGY PREOPERATIVE HISTORY AND PHYSICAL   Subjective:  Lauren Manning is a postmenopausal 87 y.o. [phone] here for surgical management of posterior vaginal wall laceration after pessary removal.  Patient currently with Grade 3 cystocele, vaginal atrophy, history of recurrent UTI's.  Has utilized pessary for several years. No significant preoperative concerns.  Proposed surgery: Posterior vaginal wall repair    Past Medical History:  Diagnosis Date   Arteriosclerotic cardiovascular disease 11/21/2015   Arthritis    Gout   Arthritis urica 11/21/2015   Baden-Walker grade 3 cystocele 05/19/2016   Cataract 11/21/2015   Cervical prolapse 11/21/2015   DD (diverticular disease) 11/21/2015   Essential (primary) hypertension 11/21/2015   GERD (gastroesophageal reflux disease)    Hereditary hemochromatosis 06/09/2016   History of colonic polyps    HOH (hard of hearing)    Hypercholesteremia 11/21/2015   Hyperglycemia    Hyperlipidemia    Insomnia 11/21/2015   Low back pain 11/21/2015   Major neurocognitive disorder due to Alzheimer's disease, without behavioral disturbance 11/21/2015   Multiple contusions of trunk    Neck pain 12/12/2014   Osteopenia     Past Surgical History:  Procedure Laterality Date   ABDOMINAL HYSTERECTOMY     CATARACT EXTRACTION W/PHACO Left 07/22/2016   Procedure: CATARACT EXTRACTION PHACO AND INTRAOCULAR LENS PLACEMENT (IOC);  Surgeon: Galen Manila, MD;  Location: [facility] ORS;  Service: Ophthalmology;  Laterality: Left;  [place] 43.1 AP% 20.2 CDE 8.71 Fluid pack lot # [id number]   CHOLECYSTECTOMY  04/16/2004   Dr. Orlene Erm. Anterior and posterior colporrhaphy   COLONOSCOPY WITH PROPOFOL N/A 07/01/2016   Procedure: COLONOSCOPY WITH PROPOFOL;  Surgeon: Midge Minium, MD;  Location: [facility] ENDOSCOPY;  Service: Endoscopy;  Laterality: N/A;   history of colon polyps  1999   PARTIAL HYSTERECTOMY     TONSILLECTOMY     TUBAL LIGATION      OB History  Gravida Para Term  Preterm AB Living  2 2 2     2   SAB IAB Ectopic Multiple Live Births               # Outcome Date GA Lbr Len/2nd Weight Sex Type Anes PTL Lv  2 Term      Vag-Spont     1 Term      Vag-Spont       Family History  Problem Relation Age of Onset   Dementia Mother    Prostate cancer Father    Breast cancer Daughter 43       breast cancer    Social History   Socioeconomic History   Marital status: Single    Spouse name: Not on file   Number of children: Not on file   Years of education: 12   Highest education level: High school graduate  Occupational History   Occupation: Retired  Tobacco Use   Smoking status: Never   Smokeless tobacco: Never  Vaping Use   Vaping status: Never Used  Substance and Sexual Activity   Alcohol use: No   Drug use: No   Sexual activity: Not Currently    Birth control/protection: None  Other Topics Concern   Not on file  Social History Narrative   Right handed    Lives alone   One story home no steps    Caffeine prn   retired   Chemical engineer Strain: Low Risk  (01/06/2023)   Overall Financial Resource Strain (CARDIA)  Difficulty of Paying Living Expenses: Not hard at all  Food Insecurity: No Food Insecurity (01/06/2023)   Hunger Vital Sign    Worried About Running Out of Food in the Last Year: Never true    Ran Out of Food in the Last Year: Never true  Transportation Needs: No Transportation Needs (01/06/2023)   PRAPARE - Administrator, Civil Service (Medical): No    Lack of Transportation (Non-Medical): No  Physical Activity: Insufficiently Active (01/06/2023)   Exercise Vital Sign    Days of Exercise per Week: 1 day    Minutes of Exercise per Session: 10 min  Stress: No Stress Concern Present (01/06/2023)   [employer] of Occupational Health - Occupational Stress Questionnaire    Feeling of Stress : Not at all  Social Connections: Socially Integrated (01/06/2023)   Social  Connection and Isolation Panel [NHANES]    Frequency of Communication with Friends and Family: More than three times a week    Frequency of Social Gatherings with Friends and Family: More than three times a week    Attends Religious Services: More than 4 times per year    Active Member of [employer] or Organizations: Yes    Attends Engineer, structural: More than 4 times per year    Marital Status: Married  Catering manager Violence: Not At Risk (01/06/2023)   Humiliation, Afraid, Rape, and Kick questionnaire    Fear of Current or Ex-Partner: No    Emotionally Abused: No    Physically Abused: No    Sexually Abused: No    Current Outpatient Medications on File Prior to Visit  Medication Sig Dispense Refill   aspirin 81 MG tablet Take 81 mg by mouth daily.     dapagliflozin propanediol (FARXIGA) 10 MG TABS tablet TAKE ONE TABLET BY MOUTH DAILY BEFORE BREAKFAST 90 tablet 0   diclofenac (VOLTAREN) 50 MG EC tablet TAKE ONE TABLET BY MOUTH TWO TIMES DAILY AS NEEDED 60 tablet 11   hydrochlorothiazide (HYDRODIURIL) 25 MG tablet Take 1 tablet (25 mg total) by mouth daily. 30 tablet 11   lovastatin (MEVACOR) 40 MG tablet TAKE ONE TABLET BY MOUTH EVERY NIGHT AT BEDTIME (NEED OFFICE VISIT FOR FUTURE REFILLS) 90 tablet 1   metFORMIN (GLUCOPHAGE-XR) 750 MG 24 hr tablet TAKE ONE TABLET BY MOUTH DAILY WITH BREAKFAST 30 tablet 11   metoprolol succinate (TOPROL-XL) 25 MG 24 hr tablet TAKE 1 TABLET BY MOUTH ONCE A DAY 30 tablet 11   nortriptyline (PAMELOR) 25 MG capsule Take 1 capsule (25 mg total) by mouth at bedtime. 30 capsule 11   rivastigmine (EXELON) 4.5 MG capsule Take 1 capsule (4.5 mg total) by mouth 2 (two) times daily. 60 capsule 11   valsartan (DIOVAN) 80 MG tablet TAKE ONE TABLET (80 MG TOTAL) BY MOUTH DAILY. 30 tablet 0   No current facility-administered medications on file prior to visit.    Allergies  Allergen Reactions   Enalapril Other (See Comments)    Pt. Denies this  allergy Other reaction(s): Unknown   Penicillins     Has patient had a PCN reaction causing immediate rash, facial/tongue/throat swelling, SOB or lightheadedness with hypotension:unsure Has patient had a PCN reaction causing severe rash involving mucus membranes or skin necrosis:unsure Has patient had a PCN reaction that required hospitalization:unsure Has patient had a PCN reaction occurring within the last 10 years:unsure If all of the above answers are "NO", then may proceed with Cephalosporin use.  Other reaction(s): Unknown  Review of Systems Constitutional: No recent fever/chills/sweats Respiratory: No recent cough/bronchitis Cardiovascular: No chest pain Gastrointestinal: No recent nausea/vomiting/diarrhea Genitourinary: No UTI symptoms Hematologic/lymphatic:No history of coagulopathy or recent blood thinner use    Objective:   Blood pressure (!) 149/89, pulse 88, resp. rate 16, height 5\' 3"  (1.6 m), weight 115 lb 6.4 oz (52.3 kg). CONSTITUTIONAL: Well-developed, well-nourished female in no acute distress.  HENT:  Normocephalic, atraumatic, External right and left ear normal. Oropharynx is clear and moist EYES: Conjunctivae and EOM are normal. Pupils are equal, round, and reactive to light. No scleral icterus.  NECK: Normal range of motion, supple, no masses SKIN: Skin is warm and dry. No rash noted. Not diaphoretic. No erythema. No pallor. NEUROLOGIC: Alert and oriented to person and place. Normal reflexes, muscle tone coordination. No cranial nerve deficit noted. PSYCHIATRIC: Normal mood and affect. Normal behavior. Normal judgment and thought content. CARDIOVASCULAR: Normal heart rate noted, regular rhythm RESPIRATORY: Effort and breath sounds normal, no problems with respiration noted ABDOMEN: Soft, nontender, nondistended. PELVIC: Deferred MUSCULOSKELETAL: Normal range of motion. No edema and no tenderness. 2+ distal pulses.    Labs: No results found for this or  any previous visit (from the past 336 hour(s)).   Imaging Studies: No results found.  Assessment:    Posterior vaginal wall laceration History of pessary use   Plan:   Counseling: Procedure, risks, reasons, benefits and complications (including injury to bowel, bladder, major blood vessel, ureter, bleeding, possibility of transfusion, infection, or fistula formation) reviewed in detail. Likelihood of success in alleviating the patient's condition was discussed. Routine postoperative instructions will be reviewed with the patient and her family in detail after surgery.  The patient concurred with the proposed plan, giving informed written consent for the surgery.   Preop testing ordered. To remain NPO for procedure.     Hildred Laser, MD [facility]

## 2023-05-06 NOTE — Progress Notes (Signed)
Unable to reconcile PTA meds due to  patient is demented.

## 2023-05-06 NOTE — Progress Notes (Signed)
    GYNECOLOGY PROGRESS NOTE  Subjective:    Patient ID: Lauren Manning, female    DOB: 04-16-1936, 87 y.o.   MRN: [id number]  HPI  Patient is a 87 y.o. G7P2002 female who presents for Pessary Maintenance. Her last pessary check was 06/24/2022. Patient typically has pessary checks every 6 months, however has been 8-9 months with this check. She reports no vaginal bleeding or discharge. She denies pelvic discomfort and difficulty urinating or moving her bowels.  She is accompanied by her daughter today as patient has been exhibiting memory problems.   The following portions of the patient's history were reviewed and updated as appropriate: allergies, current medications, past family history, past medical history, past social history, past surgical history, and problem list.  Review of Systems Pertinent items noted in HPI and remainder of comprehensive ROS otherwise negative.   Objective:   Blood pressure (!) 149/89, pulse 88, resp. rate 16, height 5\' 3"  (1.6 m), weight 115 lb 6.4 oz (52.3 kg). Body mass index is 20.44 kg/m. General appearance: alert, cooperative, and no distress Abdomen: soft, non-tender; bowel sounds normal; no masses,  no organomegaly Pelvic: ?The patient's Size 2 3/4 Gelhorn  pessary was removed, cleaned. Moderate bleeding was noted after removal. Speculum examination revealed atrophic vaginal mucosa with laceration in posterior vaginal wall.  On further inspection, small amount of stool was noted at the laceration.   Extremities: extremities normal, atraumatic, no cyanosis or edema Neurologic: Grossly normal   Assessment:   1. Encounter for pessary maintenance   2. Baden-Walker grade 3 cystocele   3. Vaginal atrophy   4. History of UTI      Plan:   Difficult pessary removal today, with laceration to posterior vaginal wall exposing stool.  Discussion had with patient and her daughter on need for urgent repair of vaginal wall.  Patient cannot recall last time that  she ate, best guess is that she did have breakfast this morning.   The risks of surgery were discussed in detail with the patient including but not limited to: bleeding which may require transfusion or reoperation; infection which may require prolonged hospitalization or re-hospitalization and antibiotic therapy; injury to bowel, bladder, and major vessels or other surrounding organs which may lead to other procedures; formation of adhesions; need for additional procedures including laparotomy or subsequent procedures secondary to intraoperative injury or abnormal pathology; thromboembolic phenomenon; incisional problems and other postoperative or anesthesia complications.  Patient was told that the likelihood that her condition and symptoms will be treated effectively with this surgical management was very high; the postoperative expectations were also discussed in detail. Patient and mother ok with plan, agree to proceed with surgical repair.  Will send to hospital after visit. Post-op meds sent to [facility] as pharmacy may be closed later this evening after surgery. Will also notify General Surgery in case more extensive repair is needed.      Hildred Laser, MD [facility]

## 2023-05-06 NOTE — Op Note (Signed)
Procedure(s): POSTERIOR VAGINAL LACERATION REPAIR Procedure Note  Lauren Manning female 87 y.o. 05/06/2023  Indications: The patient is a 87 y.o. G34P2002 female with a history of recurrent UTIs, grade 3 cystocele, vaginal atrophy, and pessary use.  Patient had pessary removed in the office today.  Speculum exam noted a vaginal laceration that appeared to extend posteriorly into the rectum as evidenced by stool in the vaginal canal.  Pre-operative Diagnosis: Posterior vaginal wall laceration  Post-operative Diagnosis: Same  Surgeon: Hildred Laser, MD. Co-surgeon was Dr.Isami Tonna Boehringer  Assistants: Surgical scrub assist.   Anesthesia: General endotracheal anesthesia  Findings: Vaginal exam noting a posterior wall defect approximately 2.5 to 3 cm in length with extension into the rectal vault.  Rectal exam noting a through and through laceration.   Multiple denuded areas of the apex of the vagina Significant amount of hard stool in the vault indicating moderate constipation.  Procedure Details: The patient was seen in the Holding Room. The risks, benefits, complications, treatment options, and expected outcomes were discussed with the patient.  The patient concurred with the proposed plan, giving informed consent.  The site of surgery properly noted/marked. The patient was taken to the Operating Room, identified as Lauren Manning and the procedure verified as Procedure(s) (LRB): POSTERIOR VAGINAL LACERATION REPAIR (N/A). A Time Out was held and the above information confirmed.  She was then placed under general anesthesia without difficulty. She was placed in the dorsal lithotomy position, and was prepped and draped in a sterile manner.  A straight catheterization was performed.  Vaginal prep with chlorhexidine was performed.  A sterile speculum was inserted into the vagina, with some difficulty visualizing the vaginal laceration.  2 radical retractors were then utilized which allowed for better  visualization.  A digital rectal exam was performed to identify the posterior vaginal wall defect into the rectum.  The edges of the laceration were grasped with several Allis clamps and placed at the 12 and 6 o'clock positions of the laceration.  Additional clamps were then placed at 3 and 9 o'clock positions.  The laceration was then repaired in a running fashion using 2-0 Vicryl.  Palpation of the laceration noted that it appeared to be closed.  No other digital rectal exam was performed and was not able to penetrate the vaginal vault.  Attention was then turned to the rectum.  Due to significant amount of hard stool in the rectum disimpaction was performed.  A rectal speculum was then inserted into the patient's rectum.  Visualization of any remaining defect of the previous repair was performed by Dr.Isami Tonna Boehringer.  Please see separate operative note for details of the procedure.  After completion of both procedures patient was then prepped using Betadine solution in both the vaginal and rectal canals.  She received a dose of Ancef 2 g during her operative procedure, and will receive a prescription for Flagyl 500 mg twice daily for total of 14 days.  She will also be placed on a bowel regimen using Colace.  Patient tolerated the procedure well.  Sponge and instrument counts were correct x 2 at the end of the procedure.  Patient awakened and taken to the PACU in stable condition.  Estimated Blood Loss:  25 ml      Drains: straight catheterization prior to procedure with  100 ml of clear urine         Total IV Fluids: 100 ml  Specimens: None         Implants: None  Complications:  None; patient tolerated the procedure well.         Disposition: PACU - hemodynamically stable.         Condition: stable   Hildred Laser, MD [facility]

## 2023-05-06 NOTE — Anesthesia Preprocedure Evaluation (Addendum)
Anesthesia Evaluation  Patient identified by MRN, date of birth, ID band Patient awake    Reviewed: Allergy & Precautions, NPO status , Patient's Chart, lab work & pertinent test results  History of Anesthesia Complications Negative for: history of anesthetic complications  Airway Mallampati: IV   Neck ROM: Full    Dental  (+) Missing, Chipped, Poor Dentition   Pulmonary neg pulmonary ROS   Pulmonary exam normal breath sounds clear to auscultation       Cardiovascular hypertension, Normal cardiovascular exam Rhythm:Regular Rate:Normal     Neuro/Psych  PSYCHIATRIC DISORDERS (Alzheimer)     Dementia HOH    GI/Hepatic negative GI ROS,,,  Endo/Other  negative endocrine ROS    Renal/GU Renal disease (stage III CKD)     Musculoskeletal  (+) Arthritis ,    Abdominal   Peds  Hematology  (+) Blood dyscrasia (hereditary hemochromatosis)   Anesthesia Other Findings   Reproductive/Obstetrics                             Anesthesia Physical Anesthesia Plan  ASA: 3 and emergent  Anesthesia Plan: General   Post-op Pain Management:    Induction: Intravenous and Rapid sequence  PONV Risk Score and Plan: 3 and Ondansetron, Dexamethasone and Treatment may vary due to age or medical condition  Airway Management Planned: Oral ETT  Additional Equipment:   Intra-op Plan:   Post-operative Plan: Extubation in OR  Informed Consent: I have reviewed the patients History and Physical, chart, labs and discussed the procedure including the risks, benefits and alternatives for the proposed anesthesia with the patient or authorized representative who has indicated his/her understanding and acceptance.     Dental advisory given and Consent reviewed with POA  Plan Discussed with: CRNA  Anesthesia Plan Comments: (Patient and daughter at bedside consented for risks of anesthesia including but not limited  to:  - adverse reactions to medications - damage to eyes, teeth, lips or other oral mucosa - nerve damage due to positioning  - sore throat or hoarseness - damage to heart, brain, nerves, lungs, other parts of body or loss of life  Informed patient and daughter about role of CRNA in peri- and intra-operative care; they voiced understanding.)       Anesthesia Quick Evaluation

## 2023-05-07 ENCOUNTER — Encounter: Payer: Self-pay | Admitting: Obstetrics and Gynecology

## 2023-05-13 ENCOUNTER — Other Ambulatory Visit: Payer: Self-pay | Admitting: Family Medicine

## 2023-05-13 NOTE — Progress Notes (Deleted)
    OBSTETRICS/GYNECOLOGY POST-OPERATIVE CLINIC VISIT  Subjective:     Lauren Manning is a 87 y.o. female who presents to the clinic 1 weeks status post POSTERIOR VAGINAL REPAIR  for posterior vaginal repair . Eating a regular diet {with-without:5700} difficulty. Bowel movements are {normal/abnormal***:19619}. {pain control:13522::"The patient is not having any pain."}  {Common ambulatory SmartLinks:19316}  Review of Systems {ros; complete:30496}   Objective:   There were no vitals taken for this visit. There is no height or weight on file to calculate BMI.  General:  alert and no distress  Abdomen: soft, bowel sounds active, non-tender  Incision:   {incision:13716::"no dehiscence","incision well approximated","healing well","no drainage","no erythema","no hernia","no seroma","no swelling"}    Pathology:    Assessment:   Patient s/p POSTERIOR VAGINAL REPAIR  (surgery)  {doing well:13525::"Doing well postoperatively."}   Plan:   1. Continue any current medications as instructed by provider. 2. Wound care discussed. 3. Operative findings again reviewed. Pathology report discussed. 4. Activity restrictions: {restrictions:13723} 5. Anticipated return to work: not applicable. 6. Follow up: {1-91:47829} {time; units:18646} for ***    Hildred Laser, MD [facility]]

## 2023-05-14 NOTE — Telephone Encounter (Signed)
Requested Prescriptions  Pending Prescriptions Disp Refills   valsartan (DIOVAN) 80 MG tablet [Pharmacy Med Name: VALSARTAN 80MG  TABLET] 90 tablet 0    Sig: TAKE ONE TABLET (80 MG TOTAL) BY MOUTH DAILY.     Cardiovascular:  Angiotensin Receptor Blockers Failed - 05/13/2023  4:43 PM      Failed - Cr in normal range and within 180 days    Creatinine  Date Value Ref Range Status  08/18/2013 0.97 0.60 - 1.30 mg/dL Final   Creatinine, Ser  Date Value Ref Range Status  07/13/2022 1.18 (H) 0.44 - 1.00 mg/dL Final         Failed - K in normal range and within 180 days    Potassium  Date Value Ref Range Status  07/13/2022 2.7 (LL) 3.5 - 5.1 mmol/L Final    Comment:    CRITICAL RESULT CALLED TO, READ BACK BY AND VERIFIED WITH TIFFANY JOHNSON @1431  07/13/22 MJU   08/18/2013 3.2 (L) 3.5 - 5.1 mmol/L Final         Failed - Last BP in normal range    BP Readings from Last 1 Encounters:  05/06/23 (!) 166/71         Passed - Patient is not pregnant      Passed - Valid encounter within last 6 months    Recent Outpatient Visits           2 months ago History of recent fall   [facility] Jacky Kindle, FNP   8 months ago Dysuria   [facility] Merita Norton T, FNP   10 months ago Late onset Alzheimer's disease without behavioral disturbance [facility])   [facility] Merita Norton T, FNP   1 year ago Essential (primary) hypertension   [facility] Merita Norton T, FNP   2 years ago Late onset Alzheimer's disease without behavioral disturbance [facility])   [facility] Chrismon, Jodell Cipro, PA-C       Future Appointments             In 3 weeks Jacky Kindle, FNP [facility]

## 2023-05-15 ENCOUNTER — Ambulatory Visit: Payer: Medicare Other | Admitting: Obstetrics and Gynecology

## 2023-05-19 ENCOUNTER — Ambulatory Visit: Payer: Medicare Other | Admitting: Obstetrics and Gynecology

## 2023-05-19 NOTE — Progress Notes (Deleted)
    OBSTETRICS/GYNECOLOGY POST-OPERATIVE CLINIC VISIT  Subjective:     Lauren Manning is a 87 y.o. female who presents to the clinic 2 weeks status post POSTERIOR VAGINAL REPAIR  for POSTERIOR VAGINAL REPAIR . Eating a regular diet {with-without:5700} difficulty. Bowel movements are {normal/abnormal***:19619}. {pain control:13522::"The patient is not having any pain."}  {Common ambulatory SmartLinks:19316}  Review of Systems {ros; complete:30496}   Objective:   There were no vitals taken for this visit. There is no height or weight on file to calculate BMI.  General:  alert and no distress  Abdomen: soft, bowel sounds active, non-tender  Incision:   {incision:13716::"no dehiscence","incision well approximated","healing well","no drainage","no erythema","no hernia","no seroma","no swelling"}    Pathology:    Assessment:   Patient s/p POSTERIOR VAGINAL REPAIR  (surgery)  {doing well:13525::"Doing well postoperatively."}   Plan:   1. Continue any current medications as instructed by provider. 2. Wound care discussed. 3. Operative findings again reviewed. Pathology report discussed. 4. Activity restrictions: {restrictions:13723} 5. Anticipated return to work: {work return:14002}. 6. Follow up: {6-38:75643} {time; units:18646} for ***    Hildred Laser, MD [facility]

## 2023-05-20 ENCOUNTER — Ambulatory Visit (INDEPENDENT_AMBULATORY_CARE_PROVIDER_SITE_OTHER): Payer: Medicare Other | Admitting: Obstetrics and Gynecology

## 2023-05-20 ENCOUNTER — Encounter: Payer: Self-pay | Admitting: Obstetrics and Gynecology

## 2023-05-20 VITALS — BP 127/53 | HR 61 | Resp 16 | Ht 62.0 in | Wt 113.3 lb

## 2023-05-20 DIAGNOSIS — Z4889 Encounter for other specified surgical aftercare: Secondary | ICD-10-CM

## 2023-05-20 DIAGNOSIS — S3141XD Laceration without foreign body of vagina and vulva, subsequent encounter: Secondary | ICD-10-CM

## 2023-05-20 DIAGNOSIS — N8110 Cystocele, unspecified: Secondary | ICD-10-CM

## 2023-05-20 NOTE — Progress Notes (Signed)
    OBSTETRICS/GYNECOLOGY POST-OPERATIVE CLINIC VISIT  Subjective:     Lauren Manning is a 87 y.o. female who presents to the clinic 2.5 weeks status post POSTERIOR VAGINAL REPAIR  for posterior vaginal laceration extending into rectum after pessary removal. Eating a regular diet without difficulty. Bowel movements are normal. The patient is not having any pain. Reports completing antibiotic regimen. Is still on bowel regimen with stool softeners.   The following portions of the patient's history were reviewed and updated as appropriate: allergies, current medications, past family history, past medical history, past social history, past surgical history, and problem list.  Review of Systems Pertinent items are noted in HPI.   Objective:   BP (!) 127/53   Pulse 61   Resp 16   Ht 5\' 2"  (1.575 m)   Wt 113 lb 4.8 oz (51.4 kg)   BMI 20.72 kg/m  Body mass index is 20.72 kg/m.  General:  alert and no distress  Abdomen: soft, bowel sounds active, non-tender  Pelvis:   External genitalia normal. Speculum reveals atropic vagina with small amount of yellow discharge, sutures appear to be healing well. Vaginal digital and rectal exam performed, sutures intact, non-tender, no defect appreciated.     Pathology:  None  Assessment:   Patient s/p POSTERIOR VAGINAL REPAIR. Doing well postoperatively.    Plan:   1. Continue any current medications as instructed by provider. 2. Wound care discussed. 3. Operative findings again reviewed.  Currently not having any symptoms from her cystocele without pessary at this time.  4. Activity restrictions:  pelvic rest and no heavy lifting.  5. Follow up: 1 week with General Surgery for surveillance of rectal repair (post-op visit).   6. Patient's daughter is relocating and will be moving Lauren Manning with her in the next 1-2 weeks.  I discussed possibly ordering a smaller pessary for patient, can consider insertion by new provider once established in new  location. Patient ok to do. Will order Size  2 1/4 Gelhorn.     Hildred Laser, MD [facility]

## 2023-05-21 ENCOUNTER — Other Ambulatory Visit: Payer: Self-pay | Admitting: Family Medicine

## 2023-05-21 ENCOUNTER — Ambulatory Visit: Payer: Medicare Other | Admitting: Obstetrics and Gynecology

## 2023-05-22 ENCOUNTER — Telehealth (INDEPENDENT_AMBULATORY_CARE_PROVIDER_SITE_OTHER): Payer: Medicare Other | Admitting: Family Medicine

## 2023-05-22 ENCOUNTER — Ambulatory Visit: Payer: Self-pay

## 2023-05-22 ENCOUNTER — Ambulatory Visit: Payer: Medicare Other | Admitting: Family Medicine

## 2023-05-22 DIAGNOSIS — R197 Diarrhea, unspecified: Secondary | ICD-10-CM | POA: Insufficient documentation

## 2023-05-22 NOTE — Telephone Encounter (Signed)
Chief Complaint: Diarrhea Symptoms: 6-7 diarrhea episodes in the past 24hrs, recently on Antibiotics Frequency: 6-7 episodes in 24hrs, onset 3 days ago Pertinent Negatives: Patient denies abdominal pain, nausea, vomiting, dehydration Disposition: [] ED /[] Urgent Care (no appt availability in office) / [x] Appointment(In office/virtual)/ []  [facility]/ [] Home Care/ [] Refused Recommended Disposition /[] [facility]/ []  Follow-up with PCP Additional Notes: Patient's daughter [name] stated the patient has been taking Metronidazole 500mg  1 tablet BID since 05/06/23 and completed the course yesterday. Patient has been experiencing loose and watery stools x 3 days. Antibiotics were prescribed by Dr. Valentino Saxon. Patient has taking over the counter medication for the symptoms without improvement.  Patient and daughter feels she needs medication to stop the diarrhea. Care advice was given and patient has been scheduled for an appointment today at 1420. Advised if patient can not make it to the appointment to call as soon as possible. Also advised to report the symptoms to Dr. Oretha Milch office as well. [name] verbalized understanding.  Summary: diarrhea   Pt's daughter called saying her mom was having diarrhea and wants to know if there is something to be called in to the pharmacy.  [facility]  [address]     Reason for Disposition  MODERATE diarrhea (e.g., 4-6 times / day more than normal)  Answer Assessment - Initial Assessment Questions 1. ANTIBIOTIC: "What antibiotic are you taking?" "How many times per day?"     Metronidazole 500mg  1 tablet BID x 14 days 2. ANTIBIOTIC ONSET: "When was the antibiotic started?"     05/06/23 3. DIARRHEA SEVERITY: "How bad is the diarrhea?" "How many more stools have you had in the past 24 hours than normal?"    - NO DIARRHEA (SCALE 0)   - MILD (SCALE 1-3): Few loose or mushy BMs; increase of 1-3 stools over normal daily number of stools; mild increase in  ostomy output.   -  MODERATE (SCALE 4-7): Increase of 4-6 stools daily over normal; moderate increase in ostomy output. * SEVERE (SCALE 8-10; OR 'WORST POSSIBLE'): Increase of 7 or more stools daily over normal; moderate increase in ostomy output; incontinence.     6-7 episodes  4. ONSET: "When did the diarrhea begin?"      3 days ago  5. BM CONSISTENCY: "How loose or watery is the diarrhea?"      Watery and loose  6. VOMITING: "Are you also vomiting?" If Yes, ask: "How many times in the past 24 hours?"      No  7. ABDOMEN PAIN: "Are you having any abdomen pain?" If Yes, ask: "What does it feel like?" (e.g., crampy, dull, intermittent, constant)      No 8. ABDOMEN PAIN SEVERITY: If present, ask: "How bad is the pain?"  (e.g., Scale 1-10; mild, moderate, or severe)   - MILD (1-3): doesn't interfere with normal activities, abdomen soft and not tender to touch    - MODERATE (4-7): interferes with normal activities or awakens from sleep, abdomen tender to touch    - SEVERE (8-10): excruciating pain, doubled over, unable to do any normal activities       N/A 9. ORAL INTAKE: If vomiting, "Have you been able to drink liquids?" "How much liquids have you had in the past 24 hours?"     N/A 10. HYDRATION: "Any signs of dehydration?" (e.g., dry mouth [not just dry lips], too weak to stand, dizziness, new weight loss) "When did you last urinate?"       No 11. EXPOSURE: "  Have you traveled to a foreign country recently?" "Have you been exposed to anyone with diarrhea?" "Could you have eaten any food that was spoiled?"       No 12. OTHER SYMPTOMS: "Do you have any other symptoms?" (e.g., fever, blood in stool)       NO  Protocols used: Diarrhea on Antibiotics-A-AH

## 2023-05-22 NOTE — Progress Notes (Signed)
MyChart Video Visit  Virtual Visit via Video Note   This format is felt to be most appropriate for this patient at this time. Physical exam was limited by quality of the video and audio technology used for the visit.   Patient location: Home Provider location:  [facility] [address]  [address] [place], [place] [address]  I discussed the limitations of evaluation and management by telemedicine and the availability of in person appointments. The patient expressed understanding and agreed to proceed.  Patient: Lauren Manning   DOB: 1935-12-30   87 y.o. Female  MRN: [id number] Visit Date: 05/22/2023  Today's healthcare provider: Jacky Kindle, FNP   No chief complaint on file.  Subjective    HPI   Medications: Outpatient Medications Prior to Visit  Medication Sig   aspirin 81 MG tablet Take 81 mg by mouth daily.   diclofenac (VOLTAREN) 50 MG EC tablet TAKE ONE TABLET BY MOUTH TWO TIMES DAILY AS NEEDED   docusate sodium (COLACE) 100 MG capsule Take 1 capsule (100 mg total) by mouth 2 (two) times daily as needed.   FARXIGA 10 MG TABS tablet TAKE ONE TABLET BY MOUTH DAILY BEFORE BREAKFAST   hydrochlorothiazide (HYDRODIURIL) 25 MG tablet Take 1 tablet (25 mg total) by mouth daily.   HYDROcodone-acetaminophen (NORCO/VICODIN) 5-325 MG tablet Take 1 tablet by mouth every 6 (six) hours as needed.   lovastatin (MEVACOR) 40 MG tablet TAKE ONE TABLET BY MOUTH EVERY NIGHT AT BEDTIME (NEED OFFICE VISIT FOR FUTURE REFILLS)   metFORMIN (GLUCOPHAGE-XR) 750 MG 24 hr tablet TAKE ONE TABLET BY MOUTH DAILY WITH BREAKFAST   metoprolol succinate (TOPROL-XL) 25 MG 24 hr tablet TAKE 1 TABLET BY MOUTH ONCE A DAY   nortriptyline (PAMELOR) 25 MG capsule Take 1 capsule (25 mg total) by mouth at bedtime.   rivastigmine (EXELON) 4.5 MG capsule Take 1 capsule (4.5 mg total) by mouth 2 (two) times daily.   valsartan (DIOVAN) 80 MG tablet TAKE ONE TABLET (80 MG TOTAL) BY MOUTH DAILY.   No  facility-administered medications prior to visit.    Review of Systems   Objective    There were no vitals taken for this visit.      Physical Exam Pulmonary:     Effort: Pulmonary effort is normal.  Neurological:     Mental Status: She is alert.  Psychiatric:        Mood and Affect: Mood normal.        Behavior: Behavior normal.        Thought Content: Thought content normal.        Judgment: Judgment normal.      Assessment & Plan     Problem List Items Addressed This Visit       Other   Diarrhea - Primary    Acute, self limiting Reports recent surgery from vaginal tear iso pessary change; was treated with flagyl No known exposures to Cdiff; reports stool is not mucous consistency; however, encouraged to clean the bathroom with bleach and ensure hand hygiene with soap and water Pt reports ability to keep food and drink down; no concerns for dehydration at this time Has tried imodium and kaopectate OTC to assist; however, continues to have small amounts of incontinent diarrhea  Discussed use of bland, reduced fat, reduced sugar, reduced spice diet to assist Ensuring adequate fiber to assist with stool bulking Also recommend stopping metformin at this time to assist Pt and daughter are now co-residents; c/f  possible stress from mom Daughter and pt voice understanding of plan of care and will reach back out if needed as well as will plan to seek emergent care if worsening prior to GI follow up appt on Tues.      No follow-ups on file.    I discussed the assessment and treatment plan with the patient. The patient was provided an opportunity to ask questions and all were answered. The patient agreed with the plan and demonstrated an understanding of the instructions.   The patient was advised to call back or seek an in-person evaluation if the symptoms worsen or if the condition fails to improve as anticipated.  I provided 10 minutes of face-to-face time during this  encounter discussing current GI concerns and management as well need for possible emergent care if condition worsens prior to appt with surgery on Tuesday.  Leilani Merl, FNP, have reviewed all documentation for this visit. The documentation on 05/22/23 for the exam, diagnosis, procedures, and orders are all accurate and complete.  Jacky Kindle, FNP [facility] [phone] (phone) [phone] (fax)  [facility]

## 2023-05-22 NOTE — Assessment & Plan Note (Signed)
Acute, self limiting Reports recent surgery from vaginal tear iso pessary change; was treated with flagyl No known exposures to Cdiff; reports stool is not mucous consistency; however, encouraged to clean the bathroom with bleach and ensure hand hygiene with soap and water Pt reports ability to keep food and drink down; no concerns for dehydration at this time Has tried imodium and kaopectate OTC to assist; however, continues to have small amounts of incontinent diarrhea  Discussed use of bland, reduced fat, reduced sugar, reduced spice diet to assist Ensuring adequate fiber to assist with stool bulking Also recommend stopping metformin at this time to assist Pt and daughter are now co-residents; c/f possible stress from mom Daughter and pt voice understanding of plan of care and will reach back out if needed as well as will plan to seek emergent care if worsening prior to GI follow up appt on Tues.

## 2023-06-08 ENCOUNTER — Other Ambulatory Visit: Payer: Self-pay | Admitting: Family Medicine

## 2023-06-08 ENCOUNTER — Ambulatory Visit: Payer: Medicare Other | Admitting: Family Medicine

## 2023-06-15 ENCOUNTER — Telehealth: Payer: Self-pay | Admitting: Obstetrics and Gynecology

## 2023-06-15 NOTE — Telephone Encounter (Signed)
Reached out to pt to schedule pessary placement appt with Dr. Valentino Saxon.  Had to leave a message for pt to call back to schedule.

## 2023-06-17 NOTE — Telephone Encounter (Signed)
Pt no longer lives in this area.  She doesn't need the appt for the pessary.

## 2023-07-20 ENCOUNTER — Other Ambulatory Visit: Payer: Self-pay | Admitting: Family Medicine

## 2023-07-20 DIAGNOSIS — G479 Sleep disorder, unspecified: Secondary | ICD-10-CM

## 2023-07-21 ENCOUNTER — Other Ambulatory Visit: Payer: Self-pay | Admitting: Family Medicine

## 2023-07-21 DIAGNOSIS — G479 Sleep disorder, unspecified: Secondary | ICD-10-CM

## 2023-07-21 DIAGNOSIS — I10 Essential (primary) hypertension: Secondary | ICD-10-CM

## 2023-07-21 NOTE — Telephone Encounter (Signed)
Requested medication (s) are due for refill today: routing for review  Requested medication (s) are on the active medication list: no  Last refill:  unknown  Future visit scheduled: yes  Notes to clinic:  Unable to refill per protocol, Rx expired.  Medication is not on current list, routing for approval     Requested Prescriptions  Pending Prescriptions Disp Refills   amLODipine (NORVASC) 10 MG tablet [Pharmacy Med Name: amlodipine 10 mg tablet] 30 tablet 0    Sig: TAKE ONE TABLET BY MOUTH EVERY DAY     Cardiovascular: Calcium Channel Blockers 2 Passed - 07/20/2023  4:39 PM      Passed - Last BP in normal range    BP Readings from Last 1 Encounters:  05/20/23 (!) 127/53         Passed - Last Heart Rate in normal range    Pulse Readings from Last 1 Encounters:  05/20/23 61         Passed - Valid encounter within last 6 months    Recent Outpatient Visits           2 months ago Diarrhea, unspecified type   [facility] Merita Norton T, FNP   4 months ago History of recent fall   [facility]y Kindle, FNP   10 months ago Dysuria   [facility] Mer[facility] T, FNP   1 year ago Late onset Alzheimer's disease without behavioral disturbance [facility])   [facility] Merita Norton T, FNP   1 year ago Essential (primary) hypertension   [place] [facility] Merita Norton T, [place]              Signed Prescriptions Disp Refills   nortriptyline (PAMELOR) 25 MG capsule 90 capsule 0    Sig: TAKE ONE CAPSULE BY MOUTH EVERY NIGHT AT BEDTIME     Psychiatry:  Antidepressants - Heterocyclics (TCAs) Passed - 07/20/2023  4:39 PM      Passed - Valid encounter within last 6 months    Recent Outpatient Visits           2 months ago Diarrhea, unspecified type   [facility] Mer[facility] Norton T, F[facility]   4 months ago History of recent fall   [facility], FNP   10 months ago Dysuria   [facility] T, FNP   1 year ago Late onset Alzheimer's disease without behavioral disturbance [facility])   [place] [facility] Merita Norton T, FNP   1 year ago Essential (primary) hypertension   [facility] Merita Norton T, FNP               dapagliflozin propanediol (FARXIGA) 10 MG TABS tablet 90 tablet 0    Sig: TAKE ONE TABLET BY MOUTH EVERY MORNING BEFORE BREAKFAST     Endocrinology:  Diabetes - SGLT2 Inhibitors Failed - 07/20/2023  4:39 PM      Failed - Cr in normal range and within 360 days    Creatinine  Date Value Ref Range Status  08/18/2013 0.97 0.60 - 1.30 mg/dL Final   Creatinine, Ser  Date Value Ref Range Status  07/13/2022 1.18 (H) 0.44 - 1.00 mg/dL Final         Failed - HBA1C is between 0 and 7.9 and within 180 days  Hemoglobin A1C  Date Value Ref Range Status  11/21/2014 5.9  Final   Hgb A1c MFr Bld  Date Value Ref Range Status  07/02/2022 5.7 (H) 4.8 - 5.6 % Final    Comment:             Prediabetes: 5.7 - 6.4          Diabetes: >6.4          Glycemic control for adults with diabetes: <7.0          Failed - eGFR in normal range and within 360 days    EGFR (African American)  Date Value Ref Range Status  08/18/2013 >60  Final   GFR calc Af Amer  Date Value Ref Range Status  09/06/2019 47 (L) >59 mL/min/1.73 Final   EGFR (Non-African Amer.)  Date Value Ref Range Status  08/18/2013 57 (L)  Final    Comment:    eGFR values <88mL/min/1.73 m2 may be an indication of chronic kidney disease (CKD). Calculated eGFR is useful in patients with stable renal function. The eGFR calculation will not be reliable in acutely ill patients when serum creatinine is changing rapidly. It is not useful in  patients on dialysis. The eGFR calculation may not be applicable to patients at the low and high  extremes of body sizes, pregnant women, and vegetarians.    GFR, Estimated  Date Value Ref Range Status  07/13/2022 46 (L) >60 mL/min Final    Comment:    (NOTE) Calculated using the CKD-EPI Creatinine Equation (2021)    eGFR  Date Value Ref Range Status  07/02/2022 33 (L) >59 mL/min/1.73 Final         Passed - Valid encounter within last 6 months    Recent Outpatient Visits           2 months ago Diarrhea, unspecified type   [facility] Mer[facility] T, FNP   4 months ago History of recent fall   [facility], F[facility]   10 months ago Dysuria   [facility] Mer[facility] T, FNP   1 year ago Late onset Alzheimer's disease without behavioral disturbance [facility])   [place] [facility] Jacky Kindle, FNP   1 year ago Essential (primary) hypertension   [facility] Jacky Kindle, [place]

## 2023-07-21 NOTE — Telephone Encounter (Signed)
Requested Prescriptions  Pending Prescriptions Disp Refills   amLODipine (NORVASC) 10 MG tablet [Pharmacy Med Name: amlodipine 10 mg tablet] 30 tablet 0    Sig: TAKE ONE TABLET BY MOUTH EVERY DAY     Cardiovascular: Calcium Channel Blockers 2 Passed - 07/20/2023  4:39 PM      Passed - Last BP in normal range    BP Readings from Last 1 Encounters:  05/20/23 (!) 127/53         Passed - Last Heart Rate in normal range    Pulse Readings from Last 1 Encounters:  05/20/23 61         Passed - Valid encounter within last 6 months    Recent Outpatient Visits           2 months ago Diarrhea, unspecified type   [facility] Mer[facility] T, FNP   4 months ago History of recent fall   [facility], FNP   10 months ago Dysuria   [facility] T, FNP   1 year ago Late onset Alzheimer's disease without behavioral disturbance [facility])   [facility] Mer[facility] Norton T, FNP   1 year ago Essential (primary) hypertension   [facility] Merita Norton T, FNP               nortriptyline (PAMELOR) 25 MG capsule [Pharmacy Med Name: nortriptyline 25 mg capsule] 90 capsule 0    Sig: TAKE ONE CAPSULE BY MOUTH EVERY NIGHT AT BEDTIME     Psychiatry:  Antidepressants - Heterocyclics (TCAs) Passed - 07/20/2023  4:39 PM      Passed - Valid encounter within last 6 months    Recent Outpatient Visits           2 months ago Diarrhea, unspecified type   [facility], FNP   4 months ago History of recent fall   [facility] Mer[facility] Norton T, FNP   10 months ago Dysuria   [facility] T, FNP   1 year ago Late onset Alzheimer's disease without behavioral disturbance [facility])   [place] [facility] Merita Norton T, FNP   1 year ago Essential  (primary) hypertension   [facility] Mer[facility] Norton T, FNP               dapagliflozin propanediol (FARXIGA) 10 MG TABS tablet [Pharmacy Med Name: Marcelline Deist 10 mg tablet] 90 tablet 0    Sig: TAKE ONE TABLET BY MOUTH EVERY MORNING BEFORE BREAKFAST     Endocrinology:  Diabetes - SGLT2 Inhibitors Failed - 07/20/2023  4:39 PM      Failed - Cr in normal range and within 360 days    Creatinine  Date Value Ref Range Status  08/18/2013 0.97 0.60 - 1.30 mg/dL Final   Creatinine, Ser  Date Value Ref Range Status  07/13/2022 1.18 (H) 0.44 - 1.00 mg/dL Final         Failed - HBA1C is between 0 and 7.9 and within 180 days    Hemoglobin A1C  Date Value Ref Range Status  11/21/2014 5.9  Final   Hgb A1c MFr Bld  Date Value Ref Range Status  07/02/2022 5.7 (H) 4.8 - 5.6 % Final    Comment:  Prediabetes: 5.7 - 6.4          Diabetes: >6.4          Glycemic control for adults with diabetes: <7.0          Failed - eGFR in normal range and within 360 days    EGFR (African American)  Date Value Ref Range Status  08/18/2013 >60  Final   GFR calc Af Amer  Date Value Ref Range Status  09/06/2019 47 (L) >59 mL/min/1.73 Final   EGFR (Non-African Amer.)  Date Value Ref Range Status  08/18/2013 57 (L)  Final    Comment:    eGFR values <1mL/min/1.73 m2 may be an indication of chronic kidney disease (CKD). Calculated eGFR is useful in patients with stable renal function. The eGFR calculation will not be reliable in acutely ill patients when serum creatinine is changing rapidly. It is not useful in  patients on dialysis. The eGFR calculation may not be applicable to patients at the low and high extremes of body sizes, pregnant women, and vegetarians.    GFR, Estimated  Date Value Ref Range Status  07/13/2022 46 (L) >60 mL/min Final    Comment:    (NOTE) Calculated using the CKD-EPI Creatinine Equation (2021)    eGFR  Date Value Ref Range Status   07/02/2022 33 (L) >59 mL/min/1.73 Final         Passed - Valid encounter within last 6 months    Recent Outpatient Visits           2 months ago Diarrhea, unspecified type   [facility] Mer[facility] Norton T, FNP   4 months ago History of recent fall   [facility], FNP   10 months ago Dysuria   [facility], FNP   1 year ago Late onset Alzheimer's disease without behavioral disturbance [facility])   [place] [facility] Jack[facility] Kindle, FNP   1 year ago Essential (primary) hypertension   [place] [facility] Jacky Kindle, [place]

## 2023-07-21 NOTE — Telephone Encounter (Unsigned)
Copied from CRM [id number]. Topic: General - Other >> Jul 21, 2023  3:32 PM [name] wrote: Reason for CRM: Medication Refill - Medication: amLODipine (NORVASC) 10 MG tablet [[id number]]  nortriptyline (PAMELOR) 25 MG capsule [[id number]]  Has the patient contacted their pharmacy? Yes.   (Agent: If no, request that the patient contact the pharmacy for the refill. If patient does not wish to contact the pharmacy document the reason why and proceed with request.) (Agent: If yes, when and what did the pharmacy advise?)  Preferred Pharmacy (with phone number or street name): [facility] - [place], [place] - [id number] [place] [address] Phone: [phone] Fax: [phone] Hours: Not open 24 hours   Has the patient been seen for an appointment in the last year OR does the patient have an upcoming appointment? Yes.    Agent: Please be advised that RX refills may take up to 3 business days. We ask that you follow-up with your pharmacy.

## 2023-07-22 NOTE — Telephone Encounter (Signed)
Requested Prescriptions  Pending Prescriptions Disp Refills   amLODipine (NORVASC) 10 MG tablet 30 tablet 11    Sig: Take 1 tablet (10 mg total) by mouth daily.     Cardiovascular: Calcium Channel Blockers 2 Passed - 07/21/2023  4:09 PM      Passed - Last BP in normal range    BP Readings from Last 1 Encounters:  05/20/23 (!) 127/53         Passed - Last Heart Rate in normal range    Pulse Readings from Last 1 Encounters:  05/20/23 61         Passed - Valid encounter within last 6 months    Recent Outpatient Visits           2 months ago Diarrhea, unspecified type   [facility], F[facility]   4 months ago History of recent fall   [facility] Kindle, FNP   10 months ago Dysuria   [facility] T, FNP   1 year ago Late onset Alzheimer's disease without behavioral disturbance [facility])   [facility] Mer[facility] T, FNP   1 year ago Essential (primary) hypertension   [place] [facility] Merita Norton T, FNP               nortriptyline (PAMELOR) 25 MG capsule 90 capsule 0    Sig: Take 1 capsule (25 mg total) by mouth at bedtime.     Psychiatry:  Antidepressants - Heterocyclics (TCAs) Passed - 07/21/2023  4:09 PM      Passed - Valid encounter within last 6 months    Recent Outpatient Visits           2 months ago Diarrhea, unspecified type   [facility], FNP   4 months ago History of recent fall   [facility], FNP   10 months ago Dysuria   [facility] Merita Norton T, FNP   1 year ago Late onset Alzheimer's disease without behavioral disturbance [facility])   [place] [facility] Jacky Kindle, FNP   1 year ago Essential (primary) hypertension   [facility] Jacky Kindle,  [place]

## 2023-07-22 NOTE — Telephone Encounter (Signed)
Requested medication (s) are due for refill today: NO  Requested medication (s) are on the active medication list: yes    Last refill: 07/02/22  #30  11 refills  Future visit scheduled no  Notes to clinic:States discontinued 03/10/23. OV note of 03/10/23 also noted discontinued. Med remains on current med profile. Please review. Thank you.  Requested Prescriptions  Pending Prescriptions Disp Refills   amLODipine (NORVASC) 10 MG tablet 30 tablet 11    Sig: Take 1 tablet (10 mg total) by mouth daily.     Cardiovascular: Calcium Channel Blockers 2 Passed - 07/21/2023  4:09 PM      Passed - Last BP in normal range    BP Readings from Last 1 Encounters:  05/20/23 (!) 127/53         Passed - Last Heart Rate in normal range    Pulse Readings from Last 1 Encounters:  05/20/23 61         Passed - Valid encounter within last 6 months    Recent Outpatient Visits           2 months ago Diarrhea, unspecified type   [facility] Mer[facility] Norton T, FNP   4 months ago History of recent fall   [facility] Jacky Kindle, FNP   10 months ago Dysuria   [facility], FNP   1 year ago Late onset Alzheimer's disease without behavioral disturbance [facility])   [facility] Mer[facility], FNP   1 year ago Essential (primary) hypertension   [place] Merita Norton T, [place]              Refused Prescriptions Disp Refills   nortriptyline (PAMELOR) 25 MG capsule 90 capsule 0    Sig: Take 1 capsule (25 mg total) by mouth at bedtime.     Psychiatry:  Antidepressants - Heterocyclics (TCAs) Passed - 07/21/2023  4:09 PM      Passed - Valid encounter within last 6 months    Recent Outpatient Visits           2 months ago Diarrhea, unspecified type   [facility] Mer[facility] Norton T, FNP   4 months ago History of recent fall   [facility] Jacky Kindle, FNP   10 months ago Dysuria   [facility] Mer[facility], FNP   1 year ago Late onset Alzheimer's disease without behavioral disturbance [facility])   [place] [facility] Jacky Kindle, FNP   1 year ago Essential (primary) hypertension   [place] [facility] Jacky Kindle, [place]

## 2023-08-19 ENCOUNTER — Other Ambulatory Visit: Payer: Self-pay

## 2023-08-19 ENCOUNTER — Telehealth: Payer: Self-pay | Admitting: Family Medicine

## 2023-08-19 MED ORDER — AMLODIPINE BESYLATE 10 MG PO TABS: 10.0000 mg | ORAL_TABLET | Freq: Every day | ORAL | 1 refills | Status: DC

## 2023-08-19 MED ORDER — VALSARTAN 80 MG PO TABS: 80.0000 mg | ORAL_TABLET | Freq: Every day | ORAL | 1 refills | Status: DC

## 2023-08-19 NOTE — Telephone Encounter (Signed)
[employer] is requesting prescription refills valsartan (DIOVAN) 80 MG tablet  & aspirin 81 MG tablet   & amLODipine (NORVASC) 10 MG tablet  Please advise

## 2023-08-19 NOTE — Telephone Encounter (Signed)
Patient has not gotten the valsartan filled since August

## 2023-08-31 ENCOUNTER — Encounter: Payer: Self-pay | Admitting: Family Medicine

## 2023-09-01 ENCOUNTER — Other Ambulatory Visit: Payer: Self-pay | Admitting: *Deleted

## 2023-09-01 ENCOUNTER — Telehealth: Payer: Self-pay | Admitting: Family Medicine

## 2023-09-01 MED ORDER — VALSARTAN 80 MG PO TABS: 80.0000 mg | ORAL_TABLET | Freq: Every day | ORAL | 1 refills | Status: AC

## 2023-09-01 MED ORDER — AMLODIPINE BESYLATE 10 MG PO TABS: 10.0000 mg | ORAL_TABLET | Freq: Every day | ORAL | 1 refills | Status: AC

## 2023-09-01 NOTE — Telephone Encounter (Signed)
Sent refill

## 2023-09-01 NOTE — Telephone Encounter (Signed)
[facility] is requesting prescription refill valsartan (DIOVAN) 80 MG tablet  Please advise

## 2023-09-18 ENCOUNTER — Encounter: Payer: Self-pay | Admitting: Family Medicine

## 2023-09-18 ENCOUNTER — Other Ambulatory Visit: Payer: Self-pay | Admitting: Family Medicine

## 2023-09-18 NOTE — Telephone Encounter (Signed)
Forms printed and provided to [facility]

## 2023-09-21 ENCOUNTER — Telehealth: Payer: Self-pay | Admitting: Family Medicine

## 2023-09-21 NOTE — Telephone Encounter (Signed)
Pt's daughter [name] is calling in because she sent some paperwork to Robynn Pane that needs to be filled out for pt and she was checking on the status of the paperwork. [name] is requesting [facility] give her a call as soon as possible to discuss this matter.

## 2023-09-24 ENCOUNTER — Encounter: Payer: Self-pay | Admitting: Oncology

## 2023-09-24 NOTE — Telephone Encounter (Signed)
 Pt's daughter called for status update made aware of 7-10 business day turnaround policy. Daughter says she turned this in last Friday.

## 2023-09-24 NOTE — Telephone Encounter (Signed)
 Done..attach Hx, med list, office notes and lab faxed to:[phone] (The [address] of [place]).

## 2023-09-25 NOTE — Telephone Encounter (Signed)
 Spoke to pt daughter--agreed to take the pt local [facility] for TB testing.

## 2023-10-20 ENCOUNTER — Telehealth: Payer: Self-pay | Admitting: Family Medicine

## 2023-10-20 ENCOUNTER — Other Ambulatory Visit: Payer: Self-pay | Admitting: Family Medicine

## 2023-10-20 DIAGNOSIS — E7800 Pure hypercholesterolemia, unspecified: Secondary | ICD-10-CM

## 2023-10-20 DIAGNOSIS — G479 Sleep disorder, unspecified: Secondary | ICD-10-CM

## 2023-10-20 MED ORDER — LOVASTATIN 40 MG PO TABS: 40.0000 mg | ORAL_TABLET | Freq: Every day | ORAL | 0 refills | Status: AC

## 2023-10-20 MED ORDER — NORTRIPTYLINE HCL 25 MG PO CAPS: 25.0000 mg | ORAL_CAPSULE | Freq: Every day | ORAL | 0 refills | Status: AC

## 2023-10-20 NOTE — Telephone Encounter (Signed)
Received fax from  [facility] asking for refills on Lovastatin 40 mg. #90

## 2023-10-20 NOTE — Telephone Encounter (Signed)
[facility] is requesting refill nortriptyline (PAMELOR) 25 MG capsule  Please advise

## 2023-11-10 ENCOUNTER — Encounter: Payer: Self-pay | Admitting: Oncology

## 2023-11-10 ENCOUNTER — Other Ambulatory Visit (HOSPITAL_COMMUNITY): Payer: Self-pay

## 2023-11-13 ENCOUNTER — Telehealth: Payer: Self-pay

## 2023-11-13 NOTE — Telephone Encounter (Signed)
THIS PATIENT IS NO LONGER A [facility] PATIENT/L.Terisa Belardo

## 2023-12-12 ENCOUNTER — Encounter: Payer: Self-pay | Admitting: Oncology

## 2024-05-23 DEATH — deceased
